# Patient Record
Sex: Male | Born: 1975
Health system: Southern US, Community
[De-identification: ages and names within clinical notes are randomized; demographics above are authoritative.]

## PROBLEM LIST (undated history)

## (undated) ENCOUNTER — Emergency Department: Discharge status: Absent without leave | Payer: Self-pay

## (undated) DIAGNOSIS — I774 Celiac artery compression syndrome: Secondary | ICD-10-CM

## (undated) HISTORY — PX: CHOLECYSTECTOMY: SHX55

---

## 2017-06-16 ENCOUNTER — Observation Stay
Admission: EM | Admit: 2017-06-16 | Discharge: 2017-06-17 | Disposition: A | Discharge status: Home / Self Care | Payer: BLUE CROSS/BLUE SHIELD | Attending: Family Medicine | Admitting: Family Medicine

## 2017-06-16 ENCOUNTER — Encounter: Payer: Self-pay | Admitting: Emergency Medicine

## 2017-06-16 ENCOUNTER — Other Ambulatory Visit: Payer: Self-pay

## 2017-06-16 DIAGNOSIS — Z9049 Acquired absence of other specified parts of digestive tract: Secondary | ICD-10-CM | POA: Diagnosis not present

## 2017-06-16 DIAGNOSIS — R079 Chest pain, unspecified: Secondary | ICD-10-CM | POA: Insufficient documentation

## 2017-06-16 DIAGNOSIS — K859 Acute pancreatitis without necrosis or infection, unspecified: Secondary | ICD-10-CM | POA: Diagnosis not present

## 2017-06-16 DIAGNOSIS — I774 Celiac artery compression syndrome: Secondary | ICD-10-CM | POA: Insufficient documentation

## 2017-06-16 DIAGNOSIS — R1013 Epigastric pain: Secondary | ICD-10-CM | POA: Diagnosis present

## 2017-06-16 DIAGNOSIS — Z79899 Other long term (current) drug therapy: Secondary | ICD-10-CM | POA: Insufficient documentation

## 2017-06-16 DIAGNOSIS — R112 Nausea with vomiting, unspecified: Secondary | ICD-10-CM | POA: Insufficient documentation

## 2017-06-16 DIAGNOSIS — Z87891 Personal history of nicotine dependence: Secondary | ICD-10-CM | POA: Insufficient documentation

## 2017-06-16 DIAGNOSIS — G894 Chronic pain syndrome: Secondary | ICD-10-CM | POA: Diagnosis not present

## 2017-06-16 HISTORY — DX: Celiac artery compression syndrome: I77.4

## 2017-06-16 NOTE — ED Triage Notes (Signed)
Pt diagnosed 2 days ago with median arcuate ligament syndrome; presents tonight with c/o chest pain and abd pain; pt leaning to the right with his right hand holding his left chest; groaning at stat registration;

## 2017-06-17 ENCOUNTER — Encounter: Payer: Self-pay | Admitting: Emergency Medicine

## 2017-06-17 ENCOUNTER — Other Ambulatory Visit: Payer: Self-pay

## 2017-06-17 ENCOUNTER — Emergency Department: Payer: BLUE CROSS/BLUE SHIELD

## 2017-06-17 DIAGNOSIS — K859 Acute pancreatitis without necrosis or infection, unspecified: Secondary | ICD-10-CM | POA: Diagnosis present

## 2017-06-17 LAB — COMPREHENSIVE METABOLIC PANEL
ALBUMIN: 4.2 g/dL (ref 3.5–5.0)
ALT: 25 U/L (ref 17–63)
AST: 35 U/L (ref 15–41)
Alkaline Phosphatase: 81 U/L (ref 38–126)
Anion gap: 11 (ref 5–15)
BILIRUBIN TOTAL: 0.6 mg/dL (ref 0.3–1.2)
BUN: 9 mg/dL (ref 6–20)
CHLORIDE: 105 mmol/L (ref 101–111)
CO2: 22 mmol/L (ref 22–32)
Calcium: 9.1 mg/dL (ref 8.9–10.3)
Creatinine, Ser: 1.14 mg/dL (ref 0.61–1.24)
GFR calc Af Amer: >60 mL/min (ref >60)
GFR calc non Af Amer: >60 mL/min (ref >60)
GLUCOSE: 176 mg/dL — AB (ref 65–99)
POTASSIUM: 3.4 mmol/L — AB (ref 3.5–5.1)
Sodium: 138 mmol/L (ref 135–145)
Total Protein: 7.9 g/dL (ref 6.5–8.1)

## 2017-06-17 LAB — URINALYSIS, COMPLETE (UACMP) WITH MICROSCOPIC
BACTERIA UA: NONE SEEN
Bilirubin Urine: NEGATIVE
Glucose, UA: 150 mg/dL — AB
Ketones, ur: 5 mg/dL — AB
Leukocytes, UA: NEGATIVE
Nitrite: NEGATIVE
PH: 6 (ref 5.0–8.0)
Protein, ur: 30 mg/dL — AB
SPECIFIC GRAVITY, URINE: 1.017 (ref 1.005–1.030)
SQUAMOUS EPITHELIAL / LPF: NONE SEEN

## 2017-06-17 LAB — LIPASE, BLOOD: Lipase: 62 U/L — ABNORMAL HIGH (ref 11–51)

## 2017-06-17 LAB — CBC
HEMATOCRIT: 40.4 % (ref 40.0–52.0)
Hemoglobin: 14 g/dL (ref 13.0–18.0)
MCH: 32.1 pg (ref 26.0–34.0)
MCHC: 34.7 g/dL (ref 32.0–36.0)
MCV: 92.4 fL (ref 80.0–100.0)
Platelets: 243 10*3/uL (ref 150–440)
RBC: 4.37 MIL/uL — ABNORMAL LOW (ref 4.40–5.90)
RDW: 13.8 % (ref 11.5–14.5)
WBC: 6.5 10*3/uL (ref 3.8–10.6)

## 2017-06-17 LAB — SEDIMENTATION RATE: SED RATE: 38 mm/h — AB (ref 0–15)

## 2017-06-17 LAB — LACTIC ACID, PLASMA
Lactic Acid, Venous: 1.3 mmol/L (ref 0.5–1.9)
Lactic Acid, Venous: 1.5 mmol/L (ref 0.5–1.9)

## 2017-06-17 LAB — TROPONIN I: Troponin I: <0.03 ng/mL (ref <0.03)

## 2017-06-17 MED ORDER — ONDANSETRON HCL 4 MG/2ML IJ SOLN
4.0000 mg | Freq: Four times a day (QID) | INTRAMUSCULAR | Status: DC | PRN
Start: 2017-06-17 — End: 2017-06-17
  Administered 2017-06-17: 4 mg via INTRAVENOUS
  Filled 2017-06-17: qty 2

## 2017-06-17 MED ORDER — DILTIAZEM HCL ER COATED BEADS 120 MG PO CP24
120.0000 mg | ORAL_CAPSULE | Freq: Every day | ORAL | Status: DC
  Administered 2017-06-17: 120 mg via ORAL
  Filled 2017-06-17: qty 1

## 2017-06-17 MED ORDER — ACETAMINOPHEN 650 MG RE SUPP: 650.0000 mg | Freq: Four times a day (QID) | RECTAL | Status: DC | PRN

## 2017-06-17 MED ORDER — OXYCODONE-ACETAMINOPHEN 5-325 MG PO TABS: 1.0000 | ORAL_TABLET | Freq: Three times a day (TID) | ORAL | 0 refills | Status: DC | PRN

## 2017-06-17 MED ORDER — ENOXAPARIN SODIUM 40 MG/0.4ML ~~LOC~~ SOLN
40.0000 mg | SUBCUTANEOUS | Status: DC
  Filled 2017-06-17: qty 0.4

## 2017-06-17 MED ORDER — HALOPERIDOL LACTATE 5 MG/ML IJ SOLN
INTRAMUSCULAR | Status: AC
  Filled 2017-06-17: qty 1

## 2017-06-17 MED ORDER — LABETALOL HCL 5 MG/ML IV SOLN
10.0000 mg | INTRAVENOUS | Status: DC | PRN
  Administered 2017-06-17: 10 mg via INTRAVENOUS
  Filled 2017-06-17: qty 4

## 2017-06-17 MED ORDER — MORPHINE SULFATE (PF) 4 MG/ML IV SOLN
4.0000 mg | Freq: Once | INTRAVENOUS | Status: AC
  Administered 2017-06-17: 4 mg via INTRAVENOUS
  Filled 2017-06-17: qty 1

## 2017-06-17 MED ORDER — HALOPERIDOL LACTATE 5 MG/ML IJ SOLN
2.5000 mg | Freq: Once | INTRAMUSCULAR | Status: DC
  Filled 2017-06-17: qty 1

## 2017-06-17 MED ORDER — METOCLOPRAMIDE HCL 5 MG/ML IJ SOLN
10.0000 mg | Freq: Three times a day (TID) | INTRAMUSCULAR | Status: DC
  Administered 2017-06-17: 10 mg via INTRAVENOUS
  Filled 2017-06-17: qty 2

## 2017-06-17 MED ORDER — METOPROLOL TARTRATE 100 MG PO TABS: 100.0000 mg | ORAL_TABLET | Freq: Two times a day (BID) | ORAL | 0 refills | Status: DC

## 2017-06-17 MED ORDER — SODIUM CHLORIDE 0.9 % IV SOLN
INTRAVENOUS | Status: DC
  Administered 2017-06-17: 06:00:00 via INTRAVENOUS

## 2017-06-17 MED ORDER — ALUM & MAG HYDROXIDE-SIMETH 200-200-20 MG/5ML PO SUSP
30.0000 mL | ORAL | Status: DC | PRN
  Administered 2017-06-17: 30 mL via ORAL
  Filled 2017-06-17: qty 30

## 2017-06-17 MED ORDER — METOPROLOL TARTRATE 50 MG PO TABS
50.0000 mg | ORAL_TABLET | Freq: Two times a day (BID) | ORAL | Status: DC
  Administered 2017-06-17: 50 mg via ORAL
  Filled 2017-06-17: qty 1

## 2017-06-17 MED ORDER — HYDRALAZINE HCL 20 MG/ML IJ SOLN: 10.0000 mg | INTRAMUSCULAR | Status: DC | PRN

## 2017-06-17 MED ORDER — PANTOPRAZOLE SODIUM 40 MG PO TBEC: 40.0000 mg | DELAYED_RELEASE_TABLET | Freq: Every day | ORAL | 0 refills | Status: DC

## 2017-06-17 MED ORDER — METOCLOPRAMIDE HCL 5 MG PO TABS: 5.0000 mg | ORAL_TABLET | Freq: Three times a day (TID) | ORAL | 0 refills | Status: DC

## 2017-06-17 MED ORDER — HYDROMORPHONE HCL 1 MG/ML IJ SOLN
INTRAMUSCULAR | Status: AC
  Filled 2017-06-17: qty 1

## 2017-06-17 MED ORDER — ONDANSETRON HCL 4 MG/2ML IJ SOLN
4.0000 mg | Freq: Once | INTRAMUSCULAR | Status: AC | PRN
  Administered 2017-06-17: 4 mg via INTRAVENOUS
  Filled 2017-06-17: qty 2

## 2017-06-17 MED ORDER — MORPHINE SULFATE (PF) 2 MG/ML IV SOLN
2.0000 mg | Freq: Once | INTRAVENOUS | Status: AC
  Administered 2017-06-17: 2 mg via INTRAVENOUS
  Filled 2017-06-17: qty 1

## 2017-06-17 MED ORDER — SODIUM CHLORIDE 0.9 % IV BOLUS (SEPSIS)
1000.0000 mL | Freq: Once | INTRAVENOUS | Status: AC
  Administered 2017-06-17: 1000 mL via INTRAVENOUS

## 2017-06-17 MED ORDER — HYDROMORPHONE HCL 1 MG/ML IJ SOLN
1.0000 mg | Freq: Once | INTRAMUSCULAR | Status: AC
  Administered 2017-06-17: 1 mg via INTRAVENOUS

## 2017-06-17 MED ORDER — HYDROMORPHONE HCL 1 MG/ML IJ SOLN
1.0000 mg | INTRAMUSCULAR | Status: DC | PRN
  Administered 2017-06-17 (×2): 1 mg via INTRAVENOUS
  Filled 2017-06-17 (×2): qty 1

## 2017-06-17 MED ORDER — NITROGLYCERIN 0.4 MG SL SUBL
0.4000 mg | SUBLINGUAL_TABLET | SUBLINGUAL | Status: DC | PRN
  Administered 2017-06-17: 0.4 mg via SUBLINGUAL
  Filled 2017-06-17: qty 1

## 2017-06-17 MED ORDER — ONDANSETRON HCL 4 MG PO TABS: 4.0000 mg | ORAL_TABLET | Freq: Four times a day (QID) | ORAL | Status: DC | PRN

## 2017-06-17 MED ORDER — ACETAMINOPHEN 325 MG PO TABS: 650.0000 mg | ORAL_TABLET | Freq: Four times a day (QID) | ORAL | Status: DC | PRN

## 2017-06-17 MED ORDER — METOPROLOL TARTRATE 50 MG PO TABS: 100.0000 mg | ORAL_TABLET | Freq: Two times a day (BID) | ORAL | Status: DC

## 2017-06-17 MED ORDER — DILTIAZEM HCL ER COATED BEADS 120 MG PO CP24: 120.0000 mg | ORAL_CAPSULE | Freq: Every day | ORAL | 0 refills | Status: DC

## 2017-06-17 MED ORDER — ONDANSETRON HCL 4 MG/2ML IJ SOLN
4.0000 mg | Freq: Once | INTRAMUSCULAR | Status: AC
Start: 2017-06-17 — End: 2017-06-17
  Administered 2017-06-17: 4 mg via INTRAVENOUS
  Filled 2017-06-17: qty 2

## 2017-06-17 MED ORDER — GI COCKTAIL ~~LOC~~
30.0000 mL | Freq: Once | ORAL | Status: DC
  Filled 2017-06-17: qty 30

## 2017-06-17 MED ORDER — IOPAMIDOL (ISOVUE-370) INJECTION 76%
100.0000 mL | Freq: Once | INTRAVENOUS | Status: AC | PRN
  Administered 2017-06-17: 100 mL via INTRAVENOUS

## 2017-06-17 MED ORDER — SODIUM CHLORIDE 0.9 % IV BOLUS (SEPSIS): 500.0000 mL | Freq: Once | INTRAVENOUS | Status: DC

## 2017-06-17 MED ORDER — OXYCODONE-ACETAMINOPHEN 5-325 MG PO TABS
2.0000 | ORAL_TABLET | Freq: Once | ORAL | Status: DC
  Filled 2017-06-17: qty 2

## 2017-06-17 MED ORDER — KETOROLAC TROMETHAMINE 30 MG/ML IJ SOLN
30.0000 mg | Freq: Once | INTRAMUSCULAR | Status: AC
  Administered 2017-06-17: 30 mg via INTRAVENOUS
  Filled 2017-06-17: qty 1

## 2017-06-17 NOTE — ED Notes (Signed)
Patient asked to sign AMA form. Patient's spouse requesting additional medications and treatment options for patient. Patient and spouse asked if they had reconsidered staying for additional treatment. Patient's spouse requesting MD come speak with patient/spouse. MD informed.

## 2017-06-17 NOTE — Consult Note (Signed)
Kirk Miniumarren Mirriam Vadala, MD [facility]  [address] [place], [place] [address] Phone: [phone] Fax : [phone]  Consultation  Referring Provider:     Dr. Katheren ShamsSalary Primary Care Physician:  Patient, No Pcp Per Primary Gastroenterologist:   [facility]        Reason for Consultation:     Abdominal/chest pain  Date of Admission:  06/16/2017 Date of Consultation:  06/17/2017         HPI:   Kirk BaltimoreDamien Barker is a 42 y.o. male who comes in to the hospital with epigastric/chest pain. The patient has had this pain for 4 years with only a short period of time when he was living in [place] where the pain was not bothering him. The patient states he has [facility], [facility], [facility] in [place] and has been [facility] to [facility]. The patient states that his pain is so severe in the morning that he is sometimes unable to go to work. He then states that lasts for about 2 hours and then eases up and then returns later in the day. There is no report of any food that makes it better or worse. He also does not have any change in bowel habits. The patient states that he was told that he may have ligaments at his diaphragm pulling on his esophagus that may be causing him to have his problems. He states that he has been tried on antispasmodic medications in the past such as Bentyl without any relief. He also reports that none of the previous gastroenterologists have come up with a diagnosis as to the cause of his chronic pain. He says he is usually sent to the pain clinic after nothing is found.  Past Medical History:  Diagnosis Date  . Median arcuate ligament syndrome [facility](HCC)     Past Surgical History:  Procedure Laterality Date  . CHOLECYSTECTOMY      Prior to Admission medications   Medication Sig Start Date End Date Taking? Authorizing Provider  metoprolol tartrate (LOPRESSOR) 100 MG tablet Take 1 tablet (100 mg total) by mouth 2 (two) times daily. 06/17/17   Salary, Evelena AsaMontell  D, MD  oxyCODONE-acetaminophen (PERCOCET/ROXICET) 5-325 MG tablet Take 1-2 tablets by mouth every 8 (eight) hours as needed for severe pain. 06/17/17   Salary, [name], MD  pantoprazole (PROTONIX) 40 MG tablet Take 1 tablet (40 mg total) by mouth daily. 06/17/17 06/17/18  Salary, [name], MD    History reviewed. No pertinent family history.   Social History   Tobacco Use  . Smoking status: Former Games developermoker  . Smokeless tobacco: Never Used  Substance Use Topics  . Alcohol use: No    Frequency: Never  . Drug use: No    Allergies as of 06/16/2017  . (No Known Allergies)    Review of Systems:    All systems reviewed and negative except where noted in HPI.   Physical Exam:  Vital signs in last 24 hours: Temp:  [98.1 F (36.7 C)-98.6 F (37 C)] 98.6 F (37 C) (01/20 0804) Pulse Rate:  [74-108] 79 (01/20 0804) Resp:  [10-48] 18 (01/20 0804) BP: (145-188)/(79-109) 173/96 (01/20 0804) SpO2:  [92 %-100 %] 100 % (01/20 0804) Weight:  [220 lb (99.8 kg)-222 lb 12.8 oz (101.1 kg)] 222 lb 12.8 oz (101.1 kg) (01/20 13080623) Last BM Date: 06/16/17 General:   Pleasant, cooperative in NAD Head:  Normocephalic and atraumatic. Eyes:   No icterus.   Conjunctiva pink.  PERRLA. Ears:  Normal auditory acuity. Neck:  Supple; no masses or thyroidomegaly Lungs: Respirations even and unlabored. Lungs clear to auscultation bilaterally.   No wheezes, crackles, or rhonchi.  Heart:  Regular rate and rhythm;  Without murmur, clicks, rubs or gallops Abdomen:  Soft, nondistended, nontender. Normal bowel sounds. No appreciable masses or hepatomegaly.  No rebound or guarding.  Rectal:  Not performed. Msk:  Symmetrical without gross deformities.    Extremities:  Without edema, cyanosis or clubbing. Neurologic:  Alert and oriented x3;  grossly normal neurologically. Skin:  Intact without significant lesions or rashes. Cervical Nodes:  No significant cervical adenopathy. Psych:  Alert and cooperative. Normal  affect.  LAB RESULTS: Recent Labs    06/16/17 2359  WBC 6.5  HGB 14.0  HCT 40.4  PLT 243   BMET Recent Labs    06/16/17 2359  NA 138  K 3.4*  CL 105  CO2 22  GLUCOSE 176*  BUN 9  CREATININE 1.14  CALCIUM 9.1   LFT Recent Labs    06/16/17 2359  PROT 7.9  ALBUMIN 4.2  AST 35  ALT 25  ALKPHOS 81  BILITOT 0.6   PT/INR No results for input(s): LABPROT, INR in the last 72 hours.  STUDIES: Ct Angio Chest/abd/pel For Dissection W And/or Wo Contrast  Result Date: 06/17/2017 CLINICAL DATA:  Chest pain. EXAM: CT ANGIOGRAPHY CHEST, ABDOMEN AND PELVIS TECHNIQUE: Multidetector CT imaging through the chest, abdomen and pelvis was performed using the standard protocol during bolus administration of intravenous contrast. Multiplanar reconstructed images and MIPs were obtained and reviewed to evaluate the vascular anatomy. CONTRAST:  ISOVUE-370 IOPAMIDOL (ISOVUE-370) INJECTION 76% COMPARISON:  None FINDINGS: CTA CHEST FINDINGS Cardiovascular: Preferential opacification of the thoracic aorta. No evidence of thoracic aortic aneurysm or dissection. Normal heart size. No pericardial effusion. Mediastinum/Nodes: No enlarged mediastinal, hilar, or axillary lymph nodes. Thyroid gland, trachea, and esophagus demonstrate no significant findings. Lungs/Pleura: No pleural effusion. No airspace consolidation or atelectasis. Musculoskeletal: No chest wall abnormality. No acute or significant osseous findings. Review of the MIP images confirms the above findings. CTA ABDOMEN AND PELVIS FINDINGS VASCULAR Aorta: Normal caliber aorta without aneurysm, dissection, vasculitis or significant stenosis. Celiac: Patent without evidence of aneurysm, dissection, vasculitis or significant stenosis. SMA: Patent without evidence of aneurysm, dissection, vasculitis or significant stenosis. Renals: Both renal arteries are patent without evidence of aneurysm, dissection, vasculitis, fibromuscular dysplasia or  significant stenosis. IMA: Patent without evidence of aneurysm, dissection, vasculitis or significant stenosis. Inflow: Patent without evidence of aneurysm, dissection, vasculitis or significant stenosis. Veins: No obvious venous abnormality within the limitations of this arterial phase study. Review of the MIP images confirms the above findings. NON-VASCULAR Hepatobiliary: No focal liver abnormality is seen. Status post cholecystectomy. No biliary dilatation. Pancreas: Unremarkable. No pancreatic ductal dilatation or surrounding inflammatory changes. Spleen: Normal in size without focal abnormality. Adrenals/Urinary Tract: Adrenal glands are unremarkable. Kidneys are normal, without renal calculi, focal lesion, or hydronephrosis. Bladder is unremarkable. Stomach/Bowel: Stomach is within normal limits. Appendix appears normal. No evidence of bowel wall thickening, distention, or inflammatory changes. Lymphatic: No significant vascular findings are present. No enlarged abdominal or pelvic lymph nodes. Reproductive: Prostate is unremarkable. Other: No abdominal wall hernia or abnormality. No abdominopelvic ascites. Musculoskeletal: No acute or significant osseous findings. Review of the MIP images confirms the above findings. IMPRESSION: 1. No acute findings identified.  No evidence for aortic dissection. 2. Previous cholecystectomy. Electronically Signed   By: Veronda Prude.D.  On: 06/17/2017 01:27      Impression / Plan:   Kirk Barker is a 42 y.o. y/o male with chronic epigastric/chest pain that has had an extensive workup at multiple medical centers including [facility], [facility] in [place] and he has been referred over to [place]. The patient will be tried on a calcium channel blocker for possible esophageal spasms. The patient has been explained the plan and agrees with it.  Thank you for involving me in the care of this patient.      LOS: 0 days   Kirk Minium, MD  06/17/2017, 11:54  AM   Note: This dictation was prepared with Dragon dictation along with smaller phrase technology. Any transcriptional errors that result from this process are unintentional.

## 2017-06-17 NOTE — ED Notes (Signed)
Work note provided.

## 2017-06-17 NOTE — Progress Notes (Signed)
Patient discharged to home with wife.  Starting putting his clothes immediately after I told him the Dilaudid was discontinued. Patient and his wife told me that he doesn't go to the pain clinic because he doesn't want to deal with the side effects of narcotics.  He was very insistent that he doesn't want to take narcotic pain medication. He wants the underlying disease treated.   He also reports that when his pain is severe he goes to the emergency room, most recently in [place] about 2 weeks ago, and they give him frequent doses of IV Dilaudid until his pain is under control and then send him home.  He expressed frustration that we did not do this.  He has no prescription for oral pain medication to manage his pain at home. His wife reports that he will be [facility] for a work up for a laparoscopic procedure to treat his disease but doesn't know what the procedure is or what is will accomplish. I encouraged him to seek a PCP who can manage his illness and medications, and coordinate his consults, and work ups.  There is no consistent physician he has seen since this began 5 years ago. Neither the patient nor his wife were receptive of this suggestion. Patient started on Calcium Channel blocker per GI consult to help control his symptoms.  Also started on scheduled Metochlopramide for the same purpose.

## 2017-06-17 NOTE — H&P (Signed)
[facility] [place] [facility]   PATIENT NAME: Kirk Barker    MR#:  [id number]  DATE OF BIRTH:  07-Nov-1975  DATE OF ADMISSION:  06/16/2017  PRIMARY CARE PHYSICIAN: Patient, No Pcp Per   REQUESTING/REFERRING PHYSICIAN:   CHIEF COMPLAINT:   Chief Complaint  Patient presents with  . Abdominal Pain  . Chest Pain    HISTORY OF PRESENT ILLNESS: Kirk Barker  is a 42 y.o. male with a known history of median arcuate ligament syndrome presented to the emergency room with abdominal pain and epigastric discomfort which has been severe since Monday.  Patient does have a complaint for the last 4 months.  The abdominal pain is sharp in nature 6 out of 10 on a scale of 1-10.  Patient was evaluated in the emergency room his lipase level was mildly elevated.  He was given pain medication but still the abdominal pain persisted.  Patient also has some nausea and vomiting.  No history of any diarrhea.  No complaints of any rectal bleed.  Hospitalist service was consulted for further care.  Patient was worked up with CTA chest and abdomen which showed no dissection, acute pathology.  PAST MEDICAL HISTORY:   Past Medical History:  Diagnosis Date  . Median arcuate ligament syndrome (HCC)     PAST SURGICAL HISTORY:  Past Surgical History:  Procedure Laterality Date  . CHOLECYSTECTOMY      SOCIAL HISTORY:  Social History   Tobacco Use  . Smoking status: Former Games developer  . Smokeless tobacco: Never Used  Substance Use Topics  . Alcohol use: No    Frequency: Never    FAMILY HISTORY: No family history on file.  DRUG ALLERGIES: No Known Allergies  REVIEW OF SYSTEMS:   CONSTITUTIONAL: No fever, fatigue or weakness.  EYES: No blurred or double vision.  EARS, NOSE, AND THROAT: No tinnitus or ear pain.  RESPIRATORY: No cough, shortness of breath, wheezing or hemoptysis.  CARDIOVASCULAR: No chest pain, orthopnea, edema.  GASTROINTESTINAL: Has nausea, vomiting,   abdominal pain.  No diarrhea. GENITOURINARY: No dysuria, hematuria.  ENDOCRINE: No polyuria, nocturia,  HEMATOLOGY: No anemia, easy bruising or bleeding SKIN: No rash or lesion. MUSCULOSKELETAL: No joint pain or arthritis.   NEUROLOGIC: No tingling, numbness, weakness.  PSYCHIATRY: No anxiety or depression.   MEDICATIONS AT HOME:  Prior to Admission medications   Not on File      PHYSICAL EXAMINATION:   VITAL SIGNS: Blood pressure (!) 159/101, pulse 91, temperature 98.2 F (36.8 C), temperature source Oral, resp. rate 20, height 6' (1.829 m), weight 99.8 kg (220 lb), SpO2 98 %.  GENERAL:  42 y.o.-year-old patient lying in the bed with no acute distress.  EYES: Pupils equal, round, reactive to light and accommodation. No scleral icterus. Extraocular muscles intact.  HEENT: Head atraumatic, normocephalic. Oropharynx and nasopharynx clear.  NECK:  Supple, no jugular venous distention. No thyroid enlargement, no tenderness.  LUNGS: Normal breath sounds bilaterally, no wheezing, rales,rhonchi or crepitation. No use of accessory muscles of respiration.  CARDIOVASCULAR: S1, S2 normal. No murmurs, rubs, or gallops.  ABDOMEN: Soft, tenderness epigastrium and right upper quadrant, nondistended. Bowel sounds present. No organomegaly or mass.  EXTREMITIES: No pedal edema, cyanosis, or clubbing.  NEUROLOGIC: Cranial nerves II through XII are intact. Muscle strength 5/5 in all extremities. Sensation intact. Gait not checked.  PSYCHIATRIC: The patient is alert and oriented x 3.  SKIN: No obvious rash, lesion, or ulcer.   LABORATORY PANEL:  CBC Recent Labs  Lab 06/16/17 2359  WBC 6.5  HGB 14.0  HCT 40.4  PLT 243  MCV 92.4  MCH 32.1  MCHC 34.7  RDW 13.8   ------------------------------------------------------------------------------------------------------------------  Chemistries  Recent Labs  Lab 06/16/17 2359  NA 138  K 3.4*  CL 105  CO2 22  GLUCOSE 176*  BUN 9   CREATININE 1.14  CALCIUM 9.1  AST 35  ALT 25  ALKPHOS 81  BILITOT 0.6   ------------------------------------------------------------------------------------------------------------------ estimated creatinine clearance is 102.2 mL/min (by C-G formula based on SCr of 1.14 mg/dL). ------------------------------------------------------------------------------------------------------------------ No results for input(s): TSH, T4TOTAL, T3FREE, THYROIDAB in the last 72 hours.  Invalid input(s): FREET3   Coagulation profile No results for input(s): INR, PROTIME in the last 168 hours. ------------------------------------------------------------------------------------------------------------------- No results for input(s): DDIMER in the last 72 hours. -------------------------------------------------------------------------------------------------------------------  Cardiac Enzymes Recent Labs  Lab 06/16/17 2359 06/17/17 0336  TROPONINI <0.03 <0.03   ------------------------------------------------------------------------------------------------------------------ Invalid input(s): POCBNP  ---------------------------------------------------------------------------------------------------------------  Urinalysis No results found for: COLORURINE, APPEARANCEUR, LABSPEC, PHURINE, GLUCOSEU, HGBUR, BILIRUBINUR, KETONESUR, PROTEINUR, UROBILINOGEN, NITRITE, LEUKOCYTESUR   RADIOLOGY: Ct Angio Chest/abd/pel For Dissection W And/or Wo Contrast  Result Date: 06/17/2017 CLINICAL DATA:  Chest pain. EXAM: CT ANGIOGRAPHY CHEST, ABDOMEN AND PELVIS TECHNIQUE: Multidetector CT imaging through the chest, abdomen and pelvis was performed using the standard protocol during bolus administration of intravenous contrast. Multiplanar reconstructed images and MIPs were obtained and reviewed to evaluate the vascular anatomy. CONTRAST:  100mL ISOVUE-370 IOPAMIDOL (ISOVUE-370) INJECTION 76% COMPARISON:  None  FINDINGS: CTA CHEST FINDINGS Cardiovascular: Preferential opacification of the thoracic aorta. No evidence of thoracic aortic aneurysm or dissection. Normal heart size. No pericardial effusion. Mediastinum/Nodes: No enlarged mediastinal, hilar, or axillary lymph nodes. Thyroid gland, trachea, and esophagus demonstrate no significant findings. Lungs/Pleura: No pleural effusion. No airspace consolidation or atelectasis. Musculoskeletal: No chest wall abnormality. No acute or significant osseous findings. Review of the MIP images confirms the above findings. CTA ABDOMEN AND PELVIS FINDINGS VASCULAR Aorta: Normal caliber aorta without aneurysm, dissection, vasculitis or significant stenosis. Celiac: Patent without evidence of aneurysm, dissection, vasculitis or significant stenosis. SMA: Patent without evidence of aneurysm, dissection, vasculitis or significant stenosis. Renals: Both renal arteries are patent without evidence of aneurysm, dissection, vasculitis, fibromuscular dysplasia or significant stenosis. IMA: Patent without evidence of aneurysm, dissection, vasculitis or significant stenosis. Inflow: Patent without evidence of aneurysm, dissection, vasculitis or significant stenosis. Veins: No obvious venous abnormality within the limitations of this arterial phase study. Review of the MIP images confirms the above findings. NON-VASCULAR Hepatobiliary: No focal liver abnormality is seen. Status post cholecystectomy. No biliary dilatation. Pancreas: Unremarkable. No pancreatic ductal dilatation or surrounding inflammatory changes. Spleen: Normal in size without focal abnormality. Adrenals/Urinary Tract: Adrenal glands are unremarkable. Kidneys are normal, without renal calculi, focal lesion, or hydronephrosis. Bladder is unremarkable. Stomach/Bowel: Stomach is within normal limits. Appendix appears normal. No evidence of bowel wall thickening, distention, or inflammatory changes. Lymphatic: No significant vascular  findings are present. No enlarged abdominal or pelvic lymph nodes. Reproductive: Prostate is unremarkable. Other: No abdominal wall hernia or abnormality. No abdominopelvic ascites. Musculoskeletal: No acute or significant osseous findings. Review of the MIP images confirms the above findings. IMPRESSION: 1. No acute findings identified.  No evidence for aortic dissection. 2. Previous cholecystectomy. Electronically Signed   By: Signa Kellaylor  Stroud M.D.   On: 06/17/2017 01:27    EKG: Orders placed or performed during the hospital encounter of 06/16/17  . ED EKG  . ED EKG  . EKG  12-Lead  . EKG 12-Lead    IMPRESSION AND PLAN: 42 year old male patient with history of median arcuate ligament syndrome presented to the emergency room with abdominal pain and vomiting.  Admitting diagnosis 1.  Acute pancreatitis 2.  Abdominal pain 3 nausea and vomiting. Treatment plan Admit patient to medical floor IV fluids Pain management with IV Dilaudid Clear liquid diet Follow-up lipase level Antiemetics  All the records are reviewed and case discussed with ED provider. Management plans discussed with the patient, family and they are in agreement.  CODE STATUS: Full code Code Status History    This patient does not have a recorded code status. Please follow your organizational policy for patients in this situation.       TOTAL TIME TAKING CARE OF THIS PATIENT: 51 minutes.    Ihor Austin M.D on 06/17/2017 at 5:47 AM  Between 7am to 6pm - Pager - [phone]  After 6pm go to [email or web address] - [email or web address]  [facility]  Office  [phone]  CC: Primary care physician; Patient, No Pcp Per

## 2017-06-17 NOTE — Progress Notes (Signed)
  June 17, 2017  Patient: Kirk Barker  Date of Birth: 04/09/1976  Date of Visit: 06/16/2017    To Whom It May Concern:  Kirk Barker was seen and treated in our emergency department or urgent care center on 06/16/2017. Kirk Barker  May return to work tomorrow 06/18/17.  Sincerely,   Audria NineAmanda Kiyomi Pallo RN Dr Jetty DuhamelMontell Salary

## 2017-06-17 NOTE — Plan of Care (Signed)
Patient and wife are opposed to taking long term narcotics, but do expect to receive multiple doses of IV Diluadid in the emergency room.    Expectations are a hindrance to his care management

## 2017-06-17 NOTE — ED Notes (Signed)
Patient requesting pain medication. This RN discussed that oral pain medication (Percocet) had been offered and refused by previous Charity fundraiserN. Patient reported that all oral pain medications do not work for his pain, and instead make his abdominal pain worse. Patient requesting to be given IV pain medication only.  This RN explained that there was no IV pain medication ordered at this time and that the issue would be further discussed with the MD. MD informed.

## 2017-06-17 NOTE — ED Notes (Signed)
ED Provider at bedside. 

## 2017-06-17 NOTE — Discharge Summary (Addendum)
[facility] - [facility]   PATIENT NAME: Kirk Barker    MR#:  [id number]  DATE OF BIRTH:  07/02/1975  DATE OF ADMISSION:  06/16/2017 ADMITTING PHYSICIAN: Ihor Austin, MD  DATE OF DISCHARGE: No discharge date for patient encounter.  PRIMARY CARE PHYSICIAN: Patient, No Pcp Per    ADMISSION DIAGNOSIS:  Epigastric pain [R10.13]  Acute on chronic pain syndrome  DISCHARGE DIAGNOSIS:  Active Problems:   Pancreatitis Acute on chronic pain syndrome  SECONDARY DIAGNOSIS:   Past Medical History:  Diagnosis Date  . Median arcuate ligament syndrome [facility])     HOSPITAL COURSE:  1 acute on chronic pain syndrome Patient has had chronic abdominal pain for approximately 4 years, has had extensive workup by several gastroenterologist in the remote past, symptomatology was thought to be related to ? median arcuate ligament syndrome, referred to pain specialist in the past-has not seen in over one year, recently moved to this area within the last several days, has no local doctors, has appointment to follow-up with [facility] health system physician for further medical management of this malady-patient was encouraged to follow-up status post discharge as inpatient workup is largely unimpressive, lactic acid level normal, CT abdomen was negative, gastroenterology to see patient while in house-recommended calcium channel blocker for possible esophageal spasms, could not rule out possible opioid drug-seeking behavior, would reframe from hospital admissions for this malady, wound also reframe from IV opioid medication as we may be causing adverse behavior developement in patient.  2 acute suspected pancreatitis Ruled out CT abdomen was a normal study, lipase normal  3 acute nausea/vomiting Resolved Treated with antiemetics PRN  DISCHARGE CONDITIONS:  On day of discharge patient is afebrile, exam is stable, tolerating diet, ready for discharge home with  appropriate follow-up to establish care with local primary care provider in 2-3 days for reevaluation, patient follow-up with pain specialist in 1 week for reevaluation of acute on chronic abdominal pain, patient encouraged to follow-up with [facility] hospital system for median arcuate ligament syndrome, for more specific details please see chart  CONSULTS OBTAINED:  Treatment Team:  Midge Minium, MD  DRUG ALLERGIES:  No Known Allergies  DISCHARGE MEDICATIONS:   Allergies as of 06/17/2017   No Known Allergies     Medication List    TAKE these medications   diltiazem 120 MG 24 hr capsule Commonly known as:  CARDIZEM CD Take 1 capsule (120 mg total) by mouth daily.   metoCLOPramide 5 MG tablet Commonly known as:  REGLAN Take 1 tablet (5 mg total) by mouth 4 (four) times daily -  before meals and at bedtime.   metoprolol tartrate 100 MG tablet Commonly known as:  LOPRESSOR Take 1 tablet (100 mg total) by mouth 2 (two) times daily.   oxyCODONE-acetaminophen 5-325 MG tablet Commonly known as:  PERCOCET/ROXICET Take 1-2 tablets by mouth every 8 (eight) hours as needed for severe pain.   pantoprazole 40 MG tablet Commonly known as:  PROTONIX Take 1 tablet (40 mg total) by mouth daily.        DISCHARGE INSTRUCTIONS:   If you experience worsening of your admission symptoms, develop shortness of breath, life threatening emergency, suicidal or homicidal thoughts you must seek medical attention immediately by calling 911 or calling your MD immediately  if symptoms less severe.  You Must read complete instructions/literature along with all the possible adverse reactions/side effects for all the Medicines you take and that have been prescribed to you.  Take any new Medicines after you have completely understood and accept all the possible adverse reactions/side effects.   Please note  You were cared for by a hospitalist during your hospital stay. If you have any questions about  your discharge medications or the care you received while you were in the hospital after you are discharged, you can call the unit and asked to speak with the hospitalist on call if the hospitalist that took care of you is not available. Once you are discharged, your primary care physician will handle any further medical issues. Please note that NO REFILLS for any discharge medications will be authorized once you are discharged, as it is imperative that you return to your primary care physician (or establish a relationship with a primary care physician if you do not have one) for your aftercare needs so that they can reassess your need for medications and monitor your lab values.    Today   CHIEF COMPLAINT:   Chief Complaint  Patient presents with  . Abdominal Pain  . Chest Pain    HISTORY OF PRESENT ILLNESS:  42 y.o. male with a known history of median arcuate ligament syndrome presented to the emergency room with abdominal pain and epigastric discomfort which has been severe since Monday.  Patient does have a complaint for the last 4 months.  The abdominal pain is sharp in nature 6 out of 10 on a scale of 1-10.  Patient was evaluated in the emergency room his lipase level was mildly elevated.  He was given pain medication but still the abdominal pain persisted.  Patient also has some nausea and vomiting.  No history of any diarrhea.  No complaints of any rectal bleed.  Hospitalist service was consulted for further care.  Patient was worked up with CTA chest and abdomen which showed no dissection, acute pathology.     VITAL SIGNS:  Blood pressure (!) 163/107, pulse 77, temperature 98.6 F (37 C), temperature source Oral, resp. rate 18, height 6' (1.829 m), weight 101.1 kg (222 lb 12.8 oz), SpO2 100 %.  I/O:    Intake/Output Summary (Last 24 hours) at 06/17/2017 1302 Last data filed at 06/17/2017 1200 Gross per 24 hour  Intake -  Output 900 ml  Net -900 ml    PHYSICAL EXAMINATION:   GENERAL:  42 y.o.-year-old patient lying in the bed with no acute distress.  EYES: Pupils equal, round, reactive to light and accommodation. No scleral icterus. Extraocular muscles intact.  HEENT: Head atraumatic, normocephalic. Oropharynx and nasopharynx clear.  NECK:  Supple, no jugular venous distention. No thyroid enlargement, no tenderness.  LUNGS: Normal breath sounds bilaterally, no wheezing, rales,rhonchi or crepitation. No use of accessory muscles of respiration.  CARDIOVASCULAR: S1, S2 normal. No murmurs, rubs, or gallops.  ABDOMEN: Soft, non-tender, non-distended. Bowel sounds present. No organomegaly or mass.  EXTREMITIES: No pedal edema, cyanosis, or clubbing.  NEUROLOGIC: Cranial nerves II through XII are intact. Muscle strength 5/5 in all extremities. Sensation intact. Gait not checked.  PSYCHIATRIC: The patient is alert and oriented x 3.  SKIN: No obvious rash, lesion, or ulcer.   DATA REVIEW:   CBC Recent Labs  Lab 06/16/17 2359  WBC 6.5  HGB 14.0  HCT 40.4  PLT 243    Chemistries  Recent Labs  Lab 06/16/17 2359  NA 138  K 3.4*  CL 105  CO2 22  GLUCOSE 176*  BUN 9  CREATININE 1.14  CALCIUM 9.1  AST 35  ALT 25  ALKPHOS 81  BILITOT 0.6    Cardiac Enzymes Recent Labs  Lab 06/17/17 0336  TROPONINI <0.03    Microbiology Results  No results found for this or any previous visit.  RADIOLOGY:  Ct Angio Chest/abd/pel For Dissection W And/or Wo Contrast  Result Date: 06/17/2017 CLINICAL DATA:  Chest pain. EXAM: CT ANGIOGRAPHY CHEST, ABDOMEN AND PELVIS TECHNIQUE: Multidetector CT imaging through the chest, abdomen and pelvis was performed using the standard protocol during bolus administration of intravenous contrast. Multiplanar reconstructed images and MIPs were obtained and reviewed to evaluate the vascular anatomy. CONTRAST:  ISOVUE-370 IOPAMIDOL (ISOVUE-370) INJECTION 76% COMPARISON:  None FINDINGS: CTA CHEST FINDINGS Cardiovascular:  Preferential opacification of the thoracic aorta. No evidence of thoracic aortic aneurysm or dissection. Normal heart size. No pericardial effusion. Mediastinum/Nodes: No enlarged mediastinal, hilar, or axillary lymph nodes. Thyroid gland, trachea, and esophagus demonstrate no significant findings. Lungs/Pleura: No pleural effusion. No airspace consolidation or atelectasis. Musculoskeletal: No chest wall abnormality. No acute or significant osseous findings. Review of the MIP images confirms the above findings. CTA ABDOMEN AND PELVIS FINDINGS VASCULAR Aorta: Normal caliber aorta without aneurysm, dissection, vasculitis or significant stenosis. Celiac: Patent without evidence of aneurysm, dissection, vasculitis or significant stenosis. SMA: Patent without evidence of aneurysm, dissection, vasculitis or significant stenosis. Renals: Both renal arteries are patent without evidence of aneurysm, dissection, vasculitis, fibromuscular dysplasia or significant stenosis. IMA: Patent without evidence of aneurysm, dissection, vasculitis or significant stenosis. Inflow: Patent without evidence of aneurysm, dissection, vasculitis or significant stenosis. Veins: No obvious venous abnormality within the limitations of this arterial phase study. Review of the MIP images confirms the above findings. NON-VASCULAR Hepatobiliary: No focal liver abnormality is seen. Status post cholecystectomy. No biliary dilatation. Pancreas: Unremarkable. No pancreatic ductal dilatation or surrounding inflammatory changes. Spleen: Normal in size without focal abnormality. Adrenals/Urinary Tract: Adrenal glands are unremarkable. Kidneys are normal, without renal calculi, focal lesion, or hydronephrosis. Bladder is unremarkable. Stomach/Bowel: Stomach is within normal limits. Appendix appears normal. No evidence of bowel wall thickening, distention, or inflammatory changes. Lymphatic: No significant vascular findings are present. No enlarged abdominal  or pelvic lymph nodes. Reproductive: Prostate is unremarkable. Other: No abdominal wall hernia or abnormality. No abdominopelvic ascites. Musculoskeletal: No acute or significant osseous findings. Review of the MIP images confirms the above findings. IMPRESSION: 1. No acute findings identified.  No evidence for aortic dissection. 2. Previous cholecystectomy. Electronically Signed   By: Signa Kell M.D.   On: 06/17/2017 01:27    EKG:   Orders placed or performed during the hospital encounter of 06/16/17  . ED EKG  . ED EKG  . EKG 12-Lead  . EKG 12-Lead      Management plans discussed with the patient, family and they are in agreement.  CODE STATUS:     Code Status Orders  (From admission, onward)        Start     Ordered   06/17/17 0617  Full code  Continuous     06/17/17 0616    Code Status History    Date Active Date Inactive Code Status Order ID Comments User Context   This patient has a current code status but no historical code status.      TOTAL TIME TAKING CARE OF THIS PATIENT: 45 minutes.    Evelena Asa Lashara Urey M.D on 06/17/2017 at 1:02 PM  Between 7am to 6pm - Pager - [phone]  After 6pm go to [email or web address] - password [email or web address]  [employer]  Office  [phone]  CC: Primary care physician; Patient, No Pcp Per   Note: This dictation was prepared with Dragon dictation along with smaller phrase technology. Any transcriptional errors that result from this process are unintentional.

## 2017-06-17 NOTE — ED Notes (Signed)
Patient transported to CT 

## 2017-06-17 NOTE — Progress Notes (Signed)
MD paged to notify of pt BP of 188/109  and pt c/o pain 10/10 in luq of abdominal pain "like someone is squeezing me from the inside" pt is diaphoretic. Spoke with MD and MD to put orders in for labetalol and GI consult. And a verbal order for Morphine 2mg  iv x 1

## 2017-06-17 NOTE — ED Triage Notes (Signed)
Pt presents from home with acute onset of increased LUQ abd pain/chest pain. He is with his fiance. She states he has had pain in his abd x 5 years and has had a variety of work ups lastly last week at a hospital in [place]. He is waiting to go see the GI specialist. Tonight around 9pm his pain increased to 10/10. He is diaphoretic and moaning. His heart rate is stable. Dr Zenda AlpersWebster to bedside during triage

## 2017-06-17 NOTE — Progress Notes (Signed)
C/o nausea and vomiting since taking Maalox.  Zofran 4 mg. Given slow IVP.

## 2017-06-17 NOTE — ED Notes (Signed)
Patient requesting to leave AMA. Patient cautioned against the dangers of leaving against medical advice. Patient verbalized understanding of all issues discussed. Patient requesting work excuse not. Patient informed that note would be provided. MD informed.

## 2017-06-17 NOTE — ED Provider Notes (Signed)
[facility] Emergency Department Provider Note   ____________________________________________   First MD Initiated Contact with Patient 06/16/17 2357     (approximate)  I have reviewed the triage vital signs and the nursing notes.   HISTORY  Chief Complaint Abdominal Pain and Chest Pain    HPI Kirk Barker is a 42 y.o. male who comes into the hospital today with some epigastric pain.  The patient went to the GI clinic in [place] and was diagnosed with median arcuate ligament syndrome.  He reports that he has been [place] and they are waiting for a call for an appointment.  Tonight the patient was having some severe chest and abdominal pain.  He reports that he has had multiple studies done including CT scans EKGs and nothing has ever been discovered.  They saw GI physician who listen to his symptoms and felt that he had median arcuate ligament syndrome.  The patient is having some nausea and he states some crushing and squeezing pain in his epigastric area.  He rates the pain a 10 out of 10 in intensity.  His blood pressure is elevated.  The patient significant other states that he has been having 5 years of symptoms.  The patient is here today for evaluation.   Past Medical History:  Diagnosis Date  . Median arcuate ligament syndrome [facility])     Patient Active Problem List   Diagnosis Date Noted  . Pancreatitis 06/17/2017    Past Surgical History:  Procedure Laterality Date  . CHOLECYSTECTOMY      Prior to Admission medications   Not on File    Allergies Patient has no known allergies.  No family history on file.  Social History Social History   Tobacco Use  . Smoking status: Former Games developer  . Smokeless tobacco: Never Used  Substance Use Topics  . Alcohol use: No    Frequency: Never  . Drug use: Not on file    Review of Systems  Constitutional: No fever/chills Eyes: No visual changes. ENT: No  sore throat. Cardiovascular:  chest pain. Respiratory:  shortness of breath. Gastrointestinal:  abdominal pain.   nausea, no vomiting.  No diarrhea.  No constipation. Genitourinary: Negative for dysuria. Musculoskeletal: Negative for back pain. Skin: Negative for rash. Neurological: Negative for headaches, focal weakness or numbness.   ____________________________________________   PHYSICAL EXAM:  VITAL SIGNS: ED Triage Vitals  Enc Vitals Group     BP 06/17/17 0000 (!) 186/109     Pulse Rate 06/17/17 0000 96     Resp 06/17/17 0000 17     Temp 06/17/17 0032 98.2 F (36.8 C)     Temp Source 06/17/17 0032 Oral     SpO2 06/17/17 0000 99 %     Weight 06/16/17 2359 220 lb (99.8 kg)     Height 06/16/17 2359 6' (1.829 m)     Head Circumference --      Peak Flow --      Pain Score 06/16/17 2358 10     Pain Loc --      Pain Edu? --      Excl. in GC? --     Constitutional: Alert and oriented. Well appearing and in moderate distress. Eyes: Conjunctivae are normal. PERRL. EOMI. Head: Atraumatic. Nose: No congestion/rhinnorhea. Mouth/Throat: Mucous membranes are moist.  Oropharynx non-erythematous. Cardiovascular: Normal rate, regular rhythm. Grossly normal heart sounds.  Good peripheral circulation. Respiratory: Normal respiratory effort.  No retractions. Lungs  CTAB. Gastrointestinal: Soft with some epigastric tenderness to palpation. No distention. Positive bowel sounds Musculoskeletal: No lower extremity tenderness nor edema.   Neurologic:  Normal speech and language.  Skin:  Skin is warm, dry and intact.  Psychiatric: Mood and affect are normal.   ____________________________________________   LABS (all labs ordered are listed, but only abnormal results are displayed)  Labs Reviewed  LIPASE, BLOOD - Abnormal; Notable for the following components:      Result Value   Lipase 62 (*)    All other components within normal limits  COMPREHENSIVE METABOLIC PANEL - Abnormal;  Notable for the following components:   Potassium 3.4 (*)    Glucose, Bld 176 (*)    All other components within normal limits  CBC - Abnormal; Notable for the following components:   RBC 4.37 (*)    All other components within normal limits  TROPONIN I  TROPONIN I  URINALYSIS, COMPLETE (UACMP) WITH MICROSCOPIC   ____________________________________________  EKG  ED ECG REPORT I, Rebecka ApleyWebster,  Tymeka Privette P, the attending physician, personally viewed and interpreted this ECG.   Date: 06/16/2017  EKG Time: 2358  Rate: 107  Rhythm: sinus tachycardia  Axis: normal  Intervals:none  ST&T Change: none  ____________________________________________  RADIOLOGY  Ct Angio Chest/abd/pel For Dissection W And/or Wo Contrast  Result Date: 06/17/2017 CLINICAL DATA:  Chest pain. EXAM: CT ANGIOGRAPHY CHEST, ABDOMEN AND PELVIS TECHNIQUE: Multidetector CT imaging through the chest, abdomen and pelvis was performed using the standard protocol during bolus administration of intravenous contrast. Multiplanar reconstructed images and MIPs were obtained and reviewed to evaluate the vascular anatomy. CONTRAST:  100mL ISOVUE-370 IOPAMIDOL (ISOVUE-370) INJECTION 76% COMPARISON:  None FINDINGS: CTA CHEST FINDINGS Cardiovascular: Preferential opacification of the thoracic aorta. No evidence of thoracic aortic aneurysm or dissection. Normal heart size. No pericardial effusion. Mediastinum/Nodes: No enlarged mediastinal, hilar, or axillary lymph nodes. Thyroid gland, trachea, and esophagus demonstrate no significant findings. Lungs/Pleura: No pleural effusion. No airspace consolidation or atelectasis. Musculoskeletal: No chest wall abnormality. No acute or significant osseous findings. Review of the MIP images confirms the above findings. CTA ABDOMEN AND PELVIS FINDINGS VASCULAR Aorta: Normal caliber aorta without aneurysm, dissection, vasculitis or significant stenosis. Celiac: Patent without evidence of aneurysm,  dissection, vasculitis or significant stenosis. SMA: Patent without evidence of aneurysm, dissection, vasculitis or significant stenosis. Renals: Both renal arteries are patent without evidence of aneurysm, dissection, vasculitis, fibromuscular dysplasia or significant stenosis. IMA: Patent without evidence of aneurysm, dissection, vasculitis or significant stenosis. Inflow: Patent without evidence of aneurysm, dissection, vasculitis or significant stenosis. Veins: No obvious venous abnormality within the limitations of this arterial phase study. Review of the MIP images confirms the above findings. NON-VASCULAR Hepatobiliary: No focal liver abnormality is seen. Status post cholecystectomy. No biliary dilatation. Pancreas: Unremarkable. No pancreatic ductal dilatation or surrounding inflammatory changes. Spleen: Normal in size without focal abnormality. Adrenals/Urinary Tract: Adrenal glands are unremarkable. Kidneys are normal, without renal calculi, focal lesion, or hydronephrosis. Bladder is unremarkable. Stomach/Bowel: Stomach is within normal limits. Appendix appears normal. No evidence of bowel wall thickening, distention, or inflammatory changes. Lymphatic: No significant vascular findings are present. No enlarged abdominal or pelvic lymph nodes. Reproductive: Prostate is unremarkable. Other: No abdominal wall hernia or abnormality. No abdominopelvic ascites. Musculoskeletal: No acute or significant osseous findings. Review of the MIP images confirms the above findings. IMPRESSION: 1. No acute findings identified.  No evidence for aortic dissection. 2. Previous cholecystectomy. Electronically Signed   By: Veronda Prudeaylor  Stroud M.D.  On: 06/17/2017 01:27    ____________________________________________   PROCEDURES  Procedure(s) performed: None  Procedures  Critical Care performed: No  ____________________________________________   INITIAL IMPRESSION / ASSESSMENT AND PLAN / ED COURSE  As part of my  medical decision making, I reviewed the following data within the electronic MEDICAL RECORD NUMBER Notes from prior ED visits and [place] Controlled Substance Database   This is a 42 year old male who comes into the hospital today with some epigastric pain as well as chest and abdominal pain.  The patient's wife states that he has been having this for multiple years.  She states that he was told he may have median arcuate ligament syndrome.  We will give the patient a dose of nitroglycerin as well as morphine and Zofran.  My differential diagnosis includes acute coronary syndrome, pneumothorax, pancreatitis  I did check some blood work to include a CBC CMP and a lipase.  The patient's lipase has some mild elevation at 62.  The patient's troponin is in process.  I will send him for a CTA of his chest abdomen and pelvis.  He will be reassessed once he is received all of his studies.    Patient CT scan is unremarkable and his blood work is unremarkable.  He received a second dose of morphine as well as a dose of Dilaudid and Toradol.  He was still saying that he had some pain.  I did order some Percocet but the patient states that the Percocet makes his belly pain worse.  I also ordered a GI cocktail and the patient again said that it makes his pain worse.  The patient and his wife started stating that they wanted to leave AGAINST MEDICAL ADVICE.  We filled out his paperwork within the patient changed his mind.  He stated that he could not tolerate the pain at home.  I did offer for the patient to be admitted to the hospitalist service and the patient states that he would like to be admitted.  I will give the patient a dose of Haldol in fluids to see if that does help his pain.  The patient will be admitted to the hospitalist service for intractable abdominal pain.  ____________________________________________   FINAL CLINICAL IMPRESSION(S) / ED DIAGNOSES  Final diagnoses:  Epigastric pain     ED Discharge  Orders    None       Note:  This document was prepared using Dragon voice recognition software and may include unintentional dictation errors.    Rebecka Apley, MD 06/17/17 [phone]

## 2017-06-25 ENCOUNTER — Emergency Department
Admission: EM | Admit: 2017-06-25 | Discharge: 2017-06-25 | Disposition: A | Discharge status: Home / Self Care | Payer: BLUE CROSS/BLUE SHIELD | Attending: Emergency Medicine | Admitting: Emergency Medicine

## 2017-06-25 ENCOUNTER — Other Ambulatory Visit: Payer: Self-pay

## 2017-06-25 DIAGNOSIS — R109 Unspecified abdominal pain: Secondary | ICD-10-CM

## 2017-06-25 DIAGNOSIS — R1013 Epigastric pain: Secondary | ICD-10-CM | POA: Diagnosis not present

## 2017-06-25 DIAGNOSIS — G8929 Other chronic pain: Secondary | ICD-10-CM | POA: Insufficient documentation

## 2017-06-25 DIAGNOSIS — Z87891 Personal history of nicotine dependence: Secondary | ICD-10-CM | POA: Insufficient documentation

## 2017-06-25 DIAGNOSIS — Z9049 Acquired absence of other specified parts of digestive tract: Secondary | ICD-10-CM | POA: Diagnosis not present

## 2017-06-25 LAB — BASIC METABOLIC PANEL
ANION GAP: 12 (ref 5–15)
BUN: 7 mg/dL (ref 6–20)
CHLORIDE: 101 mmol/L (ref 101–111)
CO2: 23 mmol/L (ref 22–32)
Calcium: 9.3 mg/dL (ref 8.9–10.3)
Creatinine, Ser: 1.16 mg/dL (ref 0.61–1.24)
GFR calc Af Amer: >60 mL/min (ref >60)
GLUCOSE: 206 mg/dL — AB (ref 65–99)
Potassium: 3.2 mmol/L — ABNORMAL LOW (ref 3.5–5.1)
Sodium: 136 mmol/L (ref 135–145)

## 2017-06-25 LAB — CBC
HEMATOCRIT: 41.9 % (ref 40.0–52.0)
HEMOGLOBIN: 14.3 g/dL (ref 13.0–18.0)
MCH: 31.6 pg (ref 26.0–34.0)
MCHC: 34 g/dL (ref 32.0–36.0)
MCV: 92.7 fL (ref 80.0–100.0)
Platelets: 284 10*3/uL (ref 150–440)
RBC: 4.52 MIL/uL (ref 4.40–5.90)
RDW: 13.6 % (ref 11.5–14.5)
WBC: 8.8 10*3/uL (ref 3.8–10.6)

## 2017-06-25 LAB — TROPONIN I: Troponin I: <0.03 ng/mL (ref <0.03)

## 2017-06-25 NOTE — ED Provider Notes (Addendum)
[facility] Emergency Department Provider Note       Time seen: ----------------------------------------- 6:51 AM on 06/25/2017 -----------------------------------------   I have reviewed the triage vital signs and the nursing notes.  HISTORY   Chief Complaint Chest Pain and Abdominal Pain    HPI Kirk Barker is a 42 y.o. male with a history of median arcuate ligament syndrome who presents to the ED for abdominal pain since 0500. Patient was tachycardic and diaphoretic. EMS gave fentanyl IV for pain with some improvement. Reportedly he was just admitted into the hospital for this and diagnosed with acute on chronic pain. There is concern for celiac artery compression according to his primary care doctor. Pain is 10/10 and epigastric.  Past Medical History:  Diagnosis Date  . Median arcuate ligament syndrome [facility])     Patient Active Problem List   Diagnosis Date Noted  . Pancreatitis 06/17/2017    Past Surgical History:  Procedure Laterality Date  . CHOLECYSTECTOMY      Allergies Patient has no known allergies.  Social History Social History   Tobacco Use  . Smoking status: Former Games developer  . Smokeless tobacco: Never Used  Substance Use Topics  . Alcohol use: No    Frequency: Never  . Drug use: No   Review of Systems Constitutional: Negative for fever. Cardiovascular: Negative for chest pain. Respiratory: Negative for shortness of breath. Gastrointestinal: Positive for abdominal pain Musculoskeletal: Negative for back pain. Skin: Positive for diaphoresis Neurological: Negative for headaches, focal weakness or numbness.  All systems negative/normal/unremarkable except as stated in the HPI  ____________________________________________   PHYSICAL EXAM:  VITAL SIGNS: ED Triage Vitals  Enc Vitals Group     BP 06/25/17 0641 (!) 183/111     Pulse Rate 06/25/17 0641 (!) 103     Resp 06/25/17 0639 (!) 22     Temp 06/25/17 0639 98.7  F (37.1 C)     Temp Source 06/25/17 0639 Oral     SpO2 06/25/17 0641 97 %     Weight 06/25/17 0639 222 lb (100.7 kg)     Height 06/25/17 0639 6' (1.829 m)     Head Circumference --      Peak Flow --      Pain Score 06/25/17 0639 3     Pain Loc --      Pain Edu? --      Excl. in GC? --    Constitutional: Alert,  Distress from pain ENT   Head: Normocephalic and atraumatic.   Nose: No congestion/rhinnorhea.   Mouth/Throat: Mucous membranes are moist.   Neck: No stridor. Cardiovascular: Normal rate, regular rhythm. No murmurs, rubs, or gallops. Respiratory: Normal respiratory effort without tachypnea nor retractions. Breath sounds are clear and equal bilaterally. No wheezes/rales/rhonchi. Gastrointestinal: epigastric tenderness, no rebound or guarding, positive bowel sounds Musculoskeletal: Nontender with normal range of motion in extremities. No lower extremity tenderness nor edema. Neurologic:  Normal speech and language. No gross focal neurologic deficits are appreciated.  Skin:  Skin is warm, dry and intact. No rash noted. Psychiatric: patient somewhat agitated from pain ____________________________________________  ED COURSE:  As part of my medical decision making, I reviewed the following data within the electronic MEDICAL RECORD NUMBER History obtained from family if available, nursing notes, old chart and ekg, as well as notes from prior ED visits. Patient presented for abdominal pain, we will assess with labs and imaging as indicated at this time.   Procedures ____________________________________________   EKG: NSR, rate  is 97 beats per minute, normal axis, normal intervals, no evidence of hypertrophy  LABS (pertinent positives/negatives)  Labs Reviewed  BASIC METABOLIC PANEL - Abnormal; Notable for the following components:      Result Value   Potassium 3.2 (*)    Glucose, Bld 206 (*)    All other components within normal limits  CBC  TROPONIN I   ____________________________________________  DIFFERENTIAL DIAGNOSIS   Chronic pain, celiac artery compression, pancreatitis, gastritis   FINAL ASSESSMENT AND PLAN  Chronic pain   Plan: Patient had presented for chronic pain. Patient's labs are normal. I have advised him and the family that there is nothing more that we can offer him here today. This issue has been present for at least 5 years with no diagnosis. Testing here recently via admission and today have been negative. I will refer him to pain management for follow up.    Emily FilbertWilliams, Jonathan E, MD   Note: This note was generated in part or whole with voice recognition software. Voice recognition is usually quite accurate but there are transcription errors that can and very often do occur. I apologize for any typographical errors that were not detected and corrected.     Emily FilbertWilliams, Jonathan E, MD 06/25/17 [id number]    Emily FilbertWilliams, Jonathan E, MD 06/25/17 [phone]

## 2017-06-25 NOTE — ED Triage Notes (Addendum)
Pt with luq pain since 0500. Ems states on their arrival pt with hr in 140s, diaphoretic and "cleanched" in pain. Ems gave of fentanyl with improvement in pain from 10/10 to 3/10. Pt was recently diagnosed with "celiac artery compression" per ems per family.

## 2017-06-25 NOTE — ED Notes (Signed)
Pt triaged by this nurse last weekend and reported he was diagnosed with median arcuate ligament syndrome; pt was admitted last week and discharged home after further evaluation, prescribed percocet and zofran; pt says he has had chest pain intermittently since last week, this episode waking him from sleep this am; pt admits to not taking any pain or nausea medication as the percocet gives him abd pain, makes him vomit, and this makes his chest pain worse; pt says the pain has been a constant squeezing pain since it started this am; pt says Dilaudid is the only medication that eases his pain;

## 2017-06-26 ENCOUNTER — Encounter (INDEPENDENT_AMBULATORY_CARE_PROVIDER_SITE_OTHER): Payer: Self-pay | Admitting: Vascular Surgery

## 2017-06-26 ENCOUNTER — Ambulatory Visit (INDEPENDENT_AMBULATORY_CARE_PROVIDER_SITE_OTHER): Payer: BLUE CROSS/BLUE SHIELD | Admitting: Vascular Surgery

## 2017-06-26 VITALS — BP 139/87 | HR 89 | Resp 18 | Ht 72.0 in | Wt 221.2 lb

## 2017-06-26 DIAGNOSIS — I774 Celiac artery compression syndrome: Secondary | ICD-10-CM | POA: Diagnosis not present

## 2017-06-26 DIAGNOSIS — R1013 Epigastric pain: Secondary | ICD-10-CM | POA: Insufficient documentation

## 2017-06-26 NOTE — Assessment & Plan Note (Signed)
Patient has been told he had median arcuate ligament syndrome.  His symptoms are somewhat consistent with that in the long-standing nature of the with negative workup otherwise would also be consistent with this in a reasonably healthy otherwise young patient.  He has had weight loss and difficulty eating.  He has undergone a CT scan of the abdomen and pelvis as well as the chest which I have independently reviewed.  He essentially has no atherosclerotic disease of his vessels and no flow limitation within the visceral vessels.  The celiac artery was widely patent on this study.  This was however a standard CT scan only with inspiration and separate inspiration and expiration phases were not ordered or performed.  We are going to get a mesenteric duplex with inspiration and expiration phases to try to delineate whether or not some degree of celiac artery compression is occurring.  I think he should be seen by specialized [facility] Center, and the closest one I know of [facility] at [facility].  He is going to get his duplex in the near future and we can consider referral to them following that.

## 2017-06-26 NOTE — Assessment & Plan Note (Signed)
Although the etiology is not entirely clear, he has been told he has median arcuate ligament syndrome and he certainly could.  CT scan as discussed above.  Further workup as discussed above.

## 2017-06-26 NOTE — Progress Notes (Signed)
[name], male   DOB: [birth year], 42 y.o.   MRN: [id number]  Chief Complaint  Patient presents with  . New Patient (Initial Visit)    ref Kirk Barker for abdomen pain    HPI Kirk Barker is a 42 y.o. male.  I am asked to see the patient by Kirk Barker for evaluation of abdominal pain.  The patient reports 4-6 years of abdominal pain and left lower chest pain.  It sounds like he has had extensive workup including multiple CT scans, ultrasounds, as well as 3 upper endoscopies.  At this point, he says basically they have been undecided on what his problem is.  Recently, he was told that he had median arcuate ligament syndrome and this is the reason for further evaluation in our office.  On a visit to the emergency department earlier this month, he had a CT scan of the abdomen and pelvis as well as the chest which I have independently reviewed.  He essentially has no atherosclerotic disease of his vessels and no flow limitation within the visceral vessels.  The celiac artery was widely patent on this study.  This was however a standard CT scan only with inspiration and separate inspiration and expiration phases were not ordered or performed.   Past Medical History:  Diagnosis Date  . Median arcuate ligament syndrome [facility])     Past Surgical History:  Procedure Laterality Date  . CHOLECYSTECTOMY      Family History  Problem Relation Age of Onset  . Stroke Mother   . Diabetes Father   . Brain cancer Maternal Grandmother   No bleeding or clotting disorders  Social History Social History   Tobacco Use  . Smoking status: Former Research scientist (life sciences)  . Smokeless tobacco: Never Used  Substance Use Topics  . Alcohol use: No    Frequency: Never  . Drug use: No    Allergies  Allergen Reactions  . Other Itching    Current Outpatient Medications  Medication Sig Dispense Refill  . diltiazem (CARDIZEM CD) 120 MG 24 hr capsule Take 1 capsule (120 mg total) by mouth daily. (Patient not taking:  Reported on 06/26/2017) 90 capsule 0  . metoCLOPramide (REGLAN) 5 MG tablet Take 1 tablet (5 mg total) by mouth 4 (four) times daily -  before meals and at bedtime. (Patient not taking: Reported on 06/26/2017) 90 tablet 0  . metoprolol tartrate (LOPRESSOR) 100 MG tablet Take 1 tablet (100 mg total) by mouth 2 (two) times daily. (Patient not taking: Reported on 06/26/2017) 60 tablet 0  . oxyCODONE-acetaminophen (PERCOCET/ROXICET) 5-325 MG tablet Take 1-2 tablets by mouth every 8 (eight) hours as needed for severe pain. (Patient not taking: Reported on 06/25/2017) 10 tablet 0  . pantoprazole (PROTONIX) 40 MG tablet Take 1 tablet (40 mg total) by mouth daily. (Patient not taking: Reported on 06/26/2017) 30 tablet 0   No current facility-administered medications for this visit.       REVIEW OF SYSTEMS (Negative unless checked)  Constitutional: [x] Weight loss  [] Fever  [] Chills Cardiac: [x] Chest pain   [] Chest pressure   [] Palpitations   [] Shortness of breath when laying flat   [] Shortness of breath at rest   [] Shortness of breath with exertion. Vascular:  [] Pain in legs with walking   [] Pain in legs at rest   [] Pain in legs when laying flat   [] Claudication   [] Pain in feet when walking  [] Pain in feet at rest  [] Pain in feet when laying flat   []   History of DVT   [] Phlebitis   [] Swelling in legs   [] Varicose veins   [] Non-healing ulcers Pulmonary:   [] Uses home oxygen   [] Productive cough   [] Hemoptysis   [] Wheeze  [] COPD   [] Asthma Neurologic:  [] Dizziness  [] Blackouts   [] Seizures   [] History of stroke   [] History of TIA  [] Aphasia   [] Temporary blindness   [] Dysphagia   [] Weakness or numbness in arms   [] Weakness or numbness in legs Musculoskeletal:  [] Arthritis   [] Joint swelling   [] Joint pain   [] Low back pain Hematologic:  [] Easy bruising  [] Easy bleeding   [] Hypercoagulable state   [] Anemic  [] Hepatitis Gastrointestinal:  [] Blood in stool   [] Vomiting blood  [x] Gastroesophageal reflux/heartburn    [x] Abdominal pain Genitourinary:  [] Chronic kidney disease   [] Difficult urination  [] Frequent urination  [] Burning with urination   [] Hematuria Skin:  [] Rashes   [] Ulcers   [] Wounds Psychological:  [] History of anxiety   []  History of major depression.    Physical Exam BP 139/87 (BP Location: Right Arm)   Pulse 89   Resp 18   Ht 6' (1.829 m)   Wt 221 lb 3.2 oz (100.3 kg)   BMI 30.00 kg/m  Gen:  WD/WN, NAD Head: [place]/AT, No temporalis wasting.  Ear/Nose/Throat: Hearing grossly intact, nares w/o erythema or drainage, oropharynx w/o Erythema/Exudate Eyes: Conjunctiva clear, sclera non-icteric  Neck: trachea midline.  No JVD.  Pulmonary:  Good air movement, respirations not labored, no use of accessory muscles Cardiac: RRR, no JVD Vascular:  Vessel Right Left  Radial Palpable Palpable                                   Gastrointestinal: soft, non-tender/non-distended. Musculoskeletal: M/S 5/5 throughout.  Extremities without ischemic changes.  No deformity or atrophy. Neurologic: Sensation grossly intact in extremities.  Symmetrical.  Speech is fluent. Motor exam as listed above. Psychiatric: Judgment intact, Mood & affect appropriate for pt's clinical situation. Dermatologic: No rashes or ulcers noted.  No cellulitis or open wounds.    Radiology Ct Angio Chest/abd/pel For Dissection W And/or Wo Contrast  Result Date: 06/17/2017 CLINICAL DATA:  Chest pain. EXAM: CT ANGIOGRAPHY CHEST, ABDOMEN AND PELVIS TECHNIQUE: Multidetector CT imaging through the chest, abdomen and pelvis was performed using the standard protocol during bolus administration of intravenous contrast. Multiplanar reconstructed images and MIPs were obtained and reviewed to evaluate the vascular anatomy. CONTRAST:  185m ISOVUE-370 IOPAMIDOL (ISOVUE-370) INJECTION 76% COMPARISON:  None FINDINGS: CTA CHEST FINDINGS Cardiovascular: Preferential opacification of the thoracic aorta. No evidence of thoracic aortic  aneurysm or dissection. Normal heart size. No pericardial effusion. Mediastinum/Nodes: No enlarged mediastinal, hilar, or axillary lymph nodes. Thyroid gland, trachea, and esophagus demonstrate no significant findings. Lungs/Pleura: No pleural effusion. No airspace consolidation or atelectasis. Musculoskeletal: No chest wall abnormality. No acute or significant osseous findings. Review of the MIP images confirms the above findings. CTA ABDOMEN AND PELVIS FINDINGS VASCULAR Aorta: Normal caliber aorta without aneurysm, dissection, vasculitis or significant stenosis. Celiac: Patent without evidence of aneurysm, dissection, vasculitis or significant stenosis. SMA: Patent without evidence of aneurysm, dissection, vasculitis or significant stenosis. Renals: Both renal arteries are patent without evidence of aneurysm, dissection, vasculitis, fibromuscular dysplasia or significant stenosis. IMA: Patent without evidence of aneurysm, dissection, vasculitis or significant stenosis. Inflow: Patent without evidence of aneurysm, dissection, vasculitis or significant stenosis. Veins: No obvious venous abnormality within the limitations of this arterial  phase study. Review of the MIP images confirms the above findings. NON-VASCULAR Hepatobiliary: No focal liver abnormality is seen. Status post cholecystectomy. No biliary dilatation. Pancreas: Unremarkable. No pancreatic ductal dilatation or surrounding inflammatory changes. Spleen: Normal in size without focal abnormality. Adrenals/Urinary Tract: Adrenal glands are unremarkable. Kidneys are normal, without renal calculi, focal lesion, or hydronephrosis. Bladder is unremarkable. Stomach/Bowel: Stomach is within normal limits. Appendix appears normal. No evidence of bowel wall thickening, distention, or inflammatory changes. Lymphatic: No significant vascular findings are present. No enlarged abdominal or pelvic lymph nodes. Reproductive: Prostate is unremarkable. Other: No abdominal  wall hernia or abnormality. No abdominopelvic ascites. Musculoskeletal: No acute or significant osseous findings. Review of the MIP images confirms the above findings. IMPRESSION: 1. No acute findings identified.  No evidence for aortic dissection. 2. Previous cholecystectomy. Electronically Signed   By: Kerby Moors M.D.   On: 06/17/2017 01:27    Labs Recent Results (from the past 2160 hour(s))  Lipase, blood     Status: Abnormal   Collection Time: 06/16/17 11:59 PM  Result Value Ref Range   Lipase 62 (H) 11 - 51 U/L    Comment: Performed at [facility], [address] [place], [place] [address]  Comprehensive metabolic panel     Status: Abnormal   Collection Time: 06/16/17 11:59 PM  Result Value Ref Range   Sodium 138 135 - 145 mmol/L   Potassium 3.4 (L) 3.5 - 5.1 mmol/L   Chloride 105 101 - 111 mmol/L   CO2 22 22 - 32 mmol/L   Glucose, Bld 176 (H) 65 - 99 mg/dL   BUN 9 6 - 20 mg/dL   Creatinine, Ser 1.14 0.61 - 1.24 mg/dL   Calcium 9.1 8.9 - 10.3 mg/dL   Total Protein 7.9 6.5 - 8.1 g/dL   Albumin 4.2 3.5 - 5.0 g/dL   AST 35 15 - 41 U/L   ALT 25 17 - 63 U/L   Alkaline Phosphatase 81 38 - 126 U/L   Total Bilirubin 0.6 0.3 - 1.2 mg/dL   GFR calc non Af Amer >60 >60 mL/min   GFR calc Af Amer >60 >60 mL/min    Comment: (NOTE) The eGFR has been calculated using the CKD EPI equation. This calculation has not been validated in all clinical situations. eGFR's persistently <60 mL/min signify possible Chronic Kidney Disease.    Anion gap 11 5 - 15    Comment: Performed at [facility], [address] [place], [place] [address]  CBC     Status: Abnormal   Collection Time: 06/16/17 11:59 PM  Result Value Ref Range   WBC 6.5 3.8 - 10.6 K/uL   RBC 4.37 (L) 4.40 - 5.90 MIL/uL   Hemoglobin 14.0 13.0 - 18.0 g/dL   HCT 40.4 40.0 - 52.0 %   MCV 92.4 80.0 - 100.0 fL   MCH 32.1 26.0 - 34.0 pg   MCHC 34.7 32.0 - 36.0 g/dL   RDW 13.8 11.5 - 14.5 %   Platelets 243  150 - 440 K/uL    Comment: Performed at [facility], [address] [place], [place] [address]  Troponin I     Status: None   Collection Time: 06/16/17 11:59 PM  Result Value Ref Range   Troponin I <0.03 <0.03 ng/mL    Comment: Performed at [facility], [address], [place],  [address]  Troponin I     Status: None   Collection Time: 06/17/17  3:36  AM  Result Value Ref Range   Troponin I <0.03 <0.03 ng/mL    Comment: Performed at [facility], [address] [place], [place] [address]  Urinalysis, Complete w Microscopic     Status: Abnormal   Collection Time: 06/17/17  5:47 AM  Result Value Ref Range   Color, Urine STRAW (A) YELLOW   APPearance CLEAR (A) CLEAR   Specific Gravity, Urine 1.017 1.005 - 1.030   pH 6.0 5.0 - 8.0   Glucose, UA 150 (A) NEGATIVE mg/dL   Hgb urine dipstick SMALL (A) NEGATIVE   Bilirubin Urine NEGATIVE NEGATIVE   Ketones, ur 5 (A) NEGATIVE mg/dL   Protein, ur 30 (A) NEGATIVE mg/dL   Nitrite NEGATIVE NEGATIVE   Leukocytes, UA NEGATIVE NEGATIVE   RBC / HPF 0-5 0 - 5 RBC/hpf   WBC, UA 0-5 0 - 5 WBC/hpf   Bacteria, UA NONE SEEN NONE SEEN   Squamous Epithelial / LPF NONE SEEN NONE SEEN    Comment: Performed at [facility], [address] [place], [place] [address]  HIV antibody (Routine Testing)     Status: None (Preliminary result)   Collection Time: 06/17/17  7:56 AM  Result Value Ref Range   HIV Screen 4th Generation wRfx Non Reactive Non Reactive    Comment: (NOTE) Performed At: [facility] [place], [place] [id number] Rush Farmer MD [phone] Performed at [facility], [address] [place], [place] [address]   Sedimentation rate     Status: Abnormal   Collection Time: 06/17/17  7:56 AM  Result Value Ref Range   Sed Rate 38 (H) 0 - 15 mm/hr    Comment: Performed at [facility], [address] [place], [place] [address]  Lactic acid, plasma      Status: None   Collection Time: 06/17/17  7:56 AM  Result Value Ref Range   Lactic Acid, Venous 1.3 0.5 - 1.9 mmol/L    Comment: Performed at [facility], [address] [place], [place] [address]  Lactic acid, plasma     Status: None   Collection Time: 06/17/17 11:15 AM  Result Value Ref Range   Lactic Acid, Venous 1.5 0.5 - 1.9 mmol/L    Comment: Performed at [facility], [address] [place], [place] [address]  Basic metabolic panel     Status: Abnormal   Collection Time: 06/25/17  6:38 AM  Result Value Ref Range   Sodium 136 135 - 145 mmol/L   Potassium 3.2 (L) 3.5 - 5.1 mmol/L   Chloride 101 101 - 111 mmol/L   CO2 23 22 - 32 mmol/L   Glucose, Bld 206 (H) 65 - 99 mg/dL   BUN 7 6 - 20 mg/dL   Creatinine, Ser 1.16 0.61 - 1.24 mg/dL   Calcium 9.3 8.9 - 10.3 mg/dL   GFR calc non Af Amer >60 >60 mL/min   GFR calc Af Amer >60 >60 mL/min    Comment: (NOTE) The eGFR has been calculated using the CKD EPI equation. This calculation has not been validated in all clinical situations. eGFR's persistently <60 mL/min signify possible Chronic Kidney Disease.    Anion gap 12 5 - 15    Comment: Performed at [facility], [address] [place], [place] [address]  CBC     Status: None   Collection Time: 06/25/17  6:38 AM  Result Value Ref Range   WBC 8.8 3.8 - 10.6 K/uL  RBC 4.52 4.40 - 5.90 MIL/uL   Hemoglobin 14.3 13.0 - 18.0 g/dL   HCT 41.9 40.0 - 52.0 %   MCV 92.7 80.0 - 100.0 fL   MCH 31.6 26.0 - 34.0 pg   MCHC 34.0 32.0 - 36.0 g/dL   RDW 13.6 11.5 - 14.5 %   Platelets 284 150 - 440 K/uL    Comment: Performed at [facility], [address] [place], [place] [address]  Troponin I     Status: None   Collection Time: 06/25/17  6:38 AM  Result Value Ref Range   Troponin I <0.03 <0.03 ng/mL    Comment: Performed at [facility], [address] [place], [place] [address]    Assessment/Plan:  Median arcuate ligament  syndrome [facility]) Patient has been told he had median arcuate ligament syndrome.  His symptoms are somewhat consistent with that in the long-standing nature of the with negative workup otherwise would also be consistent with this in a reasonably healthy otherwise young patient.  He has had weight loss and difficulty eating.  He has undergone a CT scan of the abdomen and pelvis as well as the chest which I have independently reviewed.  He essentially has no atherosclerotic disease of his vessels and no flow limitation within the visceral vessels.  The celiac artery was widely patent on this study.  This was however a standard CT scan only with inspiration and separate inspiration and expiration phases were not ordered or performed.  We are going to get a mesenteric duplex with inspiration and expiration phases to try to delineate whether or not some degree of celiac artery compression is occurring.  I think he should be seen by specialized [facility], and the closest one I know of is at [facility].  He is going to get his duplex in the near future and we can consider referral to them following that.  Abdominal pain, epigastric Although the etiology is not entirely clear, he has been told he has median arcuate ligament syndrome and he certainly could.  CT scan as discussed above.  Further workup as discussed above.      Leotis Pain 06/26/2017, 3:39 PM   This note was created with Dragon medical transcription system.  Any errors from dictation are unintentional.

## 2017-06-27 ENCOUNTER — Other Ambulatory Visit (INDEPENDENT_AMBULATORY_CARE_PROVIDER_SITE_OTHER): Payer: BLUE CROSS/BLUE SHIELD

## 2017-06-27 DIAGNOSIS — I774 Celiac artery compression syndrome: Secondary | ICD-10-CM | POA: Diagnosis not present

## 2017-06-28 ENCOUNTER — Telehealth (INDEPENDENT_AMBULATORY_CARE_PROVIDER_SITE_OTHER): Payer: Self-pay

## 2017-06-28 NOTE — Telephone Encounter (Signed)
I figured it out by R&D it is GI.  Thanks

## 2017-06-28 NOTE — Telephone Encounter (Signed)
Patient called and stated that the [facility] doesn't know what the MALS center is, and he would like to know which specialty he needs to be referred to.  Chrissie NoaWilliam also called to [facility] and they had no clue what the MALS center was?  Please direct us?

## 2017-07-03 ENCOUNTER — Telehealth (INDEPENDENT_AMBULATORY_CARE_PROVIDER_SITE_OTHER): Payer: Self-pay | Admitting: Vascular Surgery

## 2017-07-03 NOTE — Telephone Encounter (Signed)
Spoke with the patient and informed him of Dr. Driscilla Grammesew's recommendation regarding going to the clinic he was referred to and being seen. Patient seen okay with this information.

## 2017-07-03 NOTE — Telephone Encounter (Signed)
Patient called states he is waiting on a callback on his US results from  1/30.

## 2017-07-30 ENCOUNTER — Encounter: Payer: BLUE CROSS/BLUE SHIELD | Admitting: Vascular Surgery

## 2017-07-30 ENCOUNTER — Encounter (HOSPITAL_COMMUNITY): Payer: BLUE CROSS/BLUE SHIELD

## 2017-09-21 LAB — HIV ANTIBODY (ROUTINE TESTING W REFLEX): HIV Screen 4th Generation wRfx: NONREACTIVE

## 2017-09-25 ENCOUNTER — Telehealth (INDEPENDENT_AMBULATORY_CARE_PROVIDER_SITE_OTHER): Payer: Self-pay | Admitting: Vascular Surgery

## 2017-09-25 NOTE — Telephone Encounter (Signed)
Patient called and said that he has not heard anything about his referral to the MALS clinic.

## 2017-09-26 NOTE — Telephone Encounter (Signed)
Spoke with the patient regarding a referral to GI, they have called him and will be calling again tomorrow and he will accept the appt. From them and move forward. He did state they don't treat MALS. An attempt has been made on a few occasion to get him in to a clinic in [place] to no avail so a referral was sent to [facility] in order to get the patient started getting some treatment.

## 2017-10-09 DIAGNOSIS — G8929 Other chronic pain: Secondary | ICD-10-CM | POA: Diagnosis not present

## 2017-10-09 DIAGNOSIS — R1013 Epigastric pain: Secondary | ICD-10-CM | POA: Diagnosis not present

## 2017-10-09 DIAGNOSIS — R079 Chest pain, unspecified: Secondary | ICD-10-CM | POA: Diagnosis not present

## 2017-10-09 DIAGNOSIS — I774 Celiac artery compression syndrome: Secondary | ICD-10-CM | POA: Diagnosis not present

## 2017-10-09 DIAGNOSIS — F17210 Nicotine dependence, cigarettes, uncomplicated: Secondary | ICD-10-CM | POA: Diagnosis not present

## 2017-10-17 ENCOUNTER — Telehealth (INDEPENDENT_AMBULATORY_CARE_PROVIDER_SITE_OTHER): Payer: Self-pay

## 2017-10-17 NOTE — Telephone Encounter (Signed)
Spoke with the patient and the insurance company only needs the report from when he was seen, he will call them and have them request the report from the day he was seen.

## 2017-10-17 NOTE — Telephone Encounter (Signed)
The patient and his disability company is calling wanting to know about the extent of his disability and how long he will be out of work. The patient was only seen on 06/26/17 and per your note he was to be referred to a clinic in [place] which he has now be referred to. There isn't any mention of taking him out of work and per the patient he has been out of work since then. How would you like me to proceed.

## 2017-10-23 DIAGNOSIS — K219 Gastro-esophageal reflux disease without esophagitis: Secondary | ICD-10-CM | POA: Diagnosis not present

## 2017-10-23 DIAGNOSIS — M545 Low back pain: Secondary | ICD-10-CM | POA: Diagnosis not present

## 2017-10-23 DIAGNOSIS — I774 Celiac artery compression syndrome: Secondary | ICD-10-CM | POA: Diagnosis not present

## 2017-11-06 DIAGNOSIS — G8929 Other chronic pain: Secondary | ICD-10-CM | POA: Diagnosis not present

## 2017-11-06 DIAGNOSIS — R079 Chest pain, unspecified: Secondary | ICD-10-CM | POA: Diagnosis not present

## 2017-11-06 DIAGNOSIS — R1013 Epigastric pain: Secondary | ICD-10-CM | POA: Diagnosis not present

## 2017-11-06 DIAGNOSIS — F17210 Nicotine dependence, cigarettes, uncomplicated: Secondary | ICD-10-CM | POA: Diagnosis not present

## 2017-11-06 DIAGNOSIS — Z9049 Acquired absence of other specified parts of digestive tract: Secondary | ICD-10-CM | POA: Diagnosis not present

## 2017-11-09 DIAGNOSIS — R109 Unspecified abdominal pain: Secondary | ICD-10-CM | POA: Diagnosis not present

## 2017-11-09 DIAGNOSIS — J9811 Atelectasis: Secondary | ICD-10-CM | POA: Diagnosis not present

## 2017-12-07 ENCOUNTER — Emergency Department: Payer: BLUE CROSS/BLUE SHIELD

## 2017-12-07 ENCOUNTER — Other Ambulatory Visit: Payer: Self-pay

## 2017-12-07 ENCOUNTER — Emergency Department
Admission: EM | Admit: 2017-12-07 | Discharge: 2017-12-07 | Disposition: A | Discharge status: Home / Self Care | Payer: BLUE CROSS/BLUE SHIELD | Attending: Emergency Medicine | Admitting: Emergency Medicine

## 2017-12-07 DIAGNOSIS — F17200 Nicotine dependence, unspecified, uncomplicated: Secondary | ICD-10-CM | POA: Diagnosis not present

## 2017-12-07 DIAGNOSIS — Z7984 Long term (current) use of oral hypoglycemic drugs: Secondary | ICD-10-CM | POA: Diagnosis not present

## 2017-12-07 DIAGNOSIS — R000 Tachycardia, unspecified: Secondary | ICD-10-CM | POA: Diagnosis not present

## 2017-12-07 DIAGNOSIS — Z79899 Other long term (current) drug therapy: Secondary | ICD-10-CM | POA: Diagnosis not present

## 2017-12-07 DIAGNOSIS — R1013 Epigastric pain: Secondary | ICD-10-CM | POA: Insufficient documentation

## 2017-12-07 DIAGNOSIS — R109 Unspecified abdominal pain: Secondary | ICD-10-CM | POA: Diagnosis not present

## 2017-12-07 LAB — URINALYSIS, COMPLETE (UACMP) WITH MICROSCOPIC
BILIRUBIN URINE: NEGATIVE
Bacteria, UA: NONE SEEN
Hgb urine dipstick: NEGATIVE
KETONES UR: NEGATIVE mg/dL
LEUKOCYTES UA: NEGATIVE
NITRITE: NEGATIVE
PH: 6 (ref 5.0–8.0)
Protein, ur: NEGATIVE mg/dL
SPECIFIC GRAVITY, URINE: 1.007 (ref 1.005–1.030)

## 2017-12-07 LAB — CBC
HCT: 39.8 % — ABNORMAL LOW (ref 40.0–52.0)
Hemoglobin: 13.7 g/dL (ref 13.0–18.0)
MCH: 32.5 pg (ref 26.0–34.0)
MCHC: 34.5 g/dL (ref 32.0–36.0)
MCV: 94.1 fL (ref 80.0–100.0)
PLATELETS: 290 10*3/uL (ref 150–440)
RBC: 4.23 MIL/uL — AB (ref 4.40–5.90)
RDW: 13 % (ref 11.5–14.5)
WBC: 8.6 10*3/uL (ref 3.8–10.6)

## 2017-12-07 LAB — COMPREHENSIVE METABOLIC PANEL
ALT: 32 U/L (ref 0–44)
ANION GAP: 10 (ref 5–15)
AST: 28 U/L (ref 15–41)
Albumin: 4 g/dL (ref 3.5–5.0)
Alkaline Phosphatase: 70 U/L (ref 38–126)
BUN: 9 mg/dL (ref 6–20)
CALCIUM: 9.2 mg/dL (ref 8.9–10.3)
CO2: 24 mmol/L (ref 22–32)
Chloride: 103 mmol/L (ref 98–111)
Creatinine, Ser: 1.02 mg/dL (ref 0.61–1.24)
GFR calc non Af Amer: >60 mL/min (ref >60)
Glucose, Bld: 230 mg/dL — ABNORMAL HIGH (ref 70–99)
Potassium: 3.8 mmol/L (ref 3.5–5.1)
SODIUM: 137 mmol/L (ref 135–145)
Total Bilirubin: 0.4 mg/dL (ref 0.3–1.2)
Total Protein: 7.6 g/dL (ref 6.5–8.1)

## 2017-12-07 LAB — URINE DRUG SCREEN, QUALITATIVE (ARMC ONLY)
Amphetamines, Ur Screen: NOT DETECTED
Benzodiazepine, Ur Scrn: NOT DETECTED
CANNABINOID 50 NG, UR ~~LOC~~: POSITIVE — AB
COCAINE METABOLITE, UR ~~LOC~~: NOT DETECTED
MDMA (ECSTASY) UR SCREEN: NOT DETECTED
Methadone Scn, Ur: NOT DETECTED
OPIATE, UR SCREEN: NOT DETECTED
Phencyclidine (PCP) Ur S: NOT DETECTED
Tricyclic, Ur Screen: NOT DETECTED

## 2017-12-07 LAB — LIPASE, BLOOD: LIPASE: 32 U/L (ref 11–51)

## 2017-12-07 LAB — TROPONIN I: Troponin I: <0.03 ng/mL (ref <0.03)

## 2017-12-07 MED ORDER — HYDROMORPHONE HCL 1 MG/ML IJ SOLN
1.0000 mg | Freq: Once | INTRAMUSCULAR | Status: AC
  Administered 2017-12-07: 1 mg via INTRAVENOUS
  Filled 2017-12-07: qty 1

## 2017-12-07 MED ORDER — MORPHINE SULFATE (PF) 4 MG/ML IV SOLN
4.0000 mg | Freq: Once | INTRAVENOUS | Status: AC
  Administered 2017-12-07: 4 mg via INTRAVENOUS
  Filled 2017-12-07: qty 1

## 2017-12-07 MED ORDER — ONDANSETRON HCL 4 MG/2ML IJ SOLN
4.0000 mg | Freq: Once | INTRAMUSCULAR | Status: AC
  Administered 2017-12-07: 4 mg via INTRAVENOUS
  Filled 2017-12-07: qty 2

## 2017-12-07 MED ORDER — METFORMIN HCL 500 MG PO TABS: 500.0000 mg | ORAL_TABLET | Freq: Two times a day (BID) | ORAL | 1 refills | Status: DC

## 2017-12-07 MED ORDER — IOPAMIDOL (ISOVUE-370) INJECTION 76%
100.0000 mL | Freq: Once | INTRAVENOUS | Status: AC | PRN
  Administered 2017-12-07: 100 mL via INTRAVENOUS

## 2017-12-07 MED ORDER — SODIUM CHLORIDE 0.9 % IV BOLUS
1000.0000 mL | Freq: Once | INTRAVENOUS | Status: AC
  Administered 2017-12-07: 1000 mL via INTRAVENOUS

## 2017-12-07 NOTE — ED Triage Notes (Signed)
Pt is brought in by EMS due to epigastric pain since this morning. States he has hx of MALS and this is a flare up. States pain normally resolves without treatment. Pain is intermittent. Pt is tearful and uncomfortable in triage. Denies N/V/D.

## 2017-12-07 NOTE — Discharge Instructions (Addendum)

## 2017-12-07 NOTE — ED Provider Notes (Signed)
[facility] Emergency Department Provider Note  ____________________________________________  Time seen: Approximately 12:52 PM  I have reviewed the triage vital signs and the nursing notes.   HISTORY  Chief Complaint Abdominal Pain   HPI Kirk Barker is a 42 y.o. male with a history of median arcuate ligament syndrome who presents for evaluation of abdominal pain.  Patient reports that he has pain like this every day for the last 5 years.  The pain usually resolves after few hours with no intervention however today the pain was not resolving.  He has had pain since last night, constant, nonradiating, dull located in his epigastric region.  Pain is now 10/10. No nausea, vomiting, fever, diarrhea, constipation, melena, hematuria, dysuria.  He has had chills.  He reports having similar pain in the past requiring IV narcotics in the emergency room which were due to MALS exacerbation.  Patient had a recent CT scan done a month ago showing no evidence of compression or any other acute findings.  Past Medical History:  Diagnosis Date  . Median arcuate ligament syndrome [facility])     Patient Active Problem List   Diagnosis Date Noted  . Median arcuate ligament syndrome (HCC) 06/26/2017  . Abdominal pain, epigastric 06/26/2017  . Pancreatitis 06/17/2017    Past Surgical History:  Procedure Laterality Date  . CHOLECYSTECTOMY      Prior to Admission medications   Medication Sig Start Date End Date Taking? Authorizing Provider  diltiazem (CARDIZEM CD) 120 MG 24 hr capsule Take 1 capsule (120 mg total) by mouth daily. Patient not taking: Reported on 06/26/2017 06/17/17   Salary, Jetty Duhamel D, MD  metFORMIN (GLUCOPHAGE) 500 MG tablet Take 1 tablet (500 mg total) by mouth 2 (two) times daily with a meal. 12/07/17   Don Perking, [place], MD  metoCLOPramide (REGLAN) 5 MG tablet Take 1 tablet (5 mg total) by mouth 4 (four) times daily -  before meals and at bedtime. Patient not  taking: Reported on 06/26/2017 06/17/17 06/17/18  Salary, Jetty Duhamel D, MD  metoprolol tartrate (LOPRESSOR) 100 MG tablet Take 1 tablet (100 mg total) by mouth 2 (two) times daily. Patient not taking: Reported on 06/26/2017 06/17/17   Salary, Jetty Duhamel D, MD  oxyCODONE-acetaminophen (PERCOCET/ROXICET) 5-325 MG tablet Take 1-2 tablets by mouth every 8 (eight) hours as needed for severe pain. Patient not taking: Reported on 06/25/2017 06/17/17   Salary, Jetty Duhamel D, MD  pantoprazole (PROTONIX) 40 MG tablet Take 1 tablet (40 mg total) by mouth daily. Patient not taking: Reported on 06/26/2017 06/17/17 06/17/18  Salary, Evelena Asa, MD    Allergies Other  Family History  Problem Relation Age of Onset  . Stroke Mother   . Diabetes Father   . Brain cancer Maternal Grandmother     Social History Social History   Tobacco Use  . Smoking status: Former Games developer  . Smokeless tobacco: Never Used  Substance Use Topics  . Alcohol use: No    Frequency: Never  . Drug use: No    Review of Systems  Constitutional: Negative for fever. Eyes: Negative for visual changes. ENT: Negative for sore throat. Neck: No neck pain  Cardiovascular: Negative for chest pain. Respiratory: Negative for shortness of breath. Gastrointestinal: + epigastric abdominal pain. No vomiting or diarrhea. Genitourinary: Negative for dysuria. Musculoskeletal: Negative for back pain. Skin: Negative for rash. Neurological: Negative for headaches, weakness or numbness. Psych: No SI or HI  ____________________________________________   PHYSICAL EXAM:  VITAL SIGNS: ED Triage Vitals  Enc  Vitals Group     BP 12/07/17 1215 (!) 168/96     Pulse Rate 12/07/17 1215 (!) 116     Resp 12/07/17 1215 (!) 22     Temp 12/07/17 1215 98.7 F (37.1 C)     Temp Source 12/07/17 1215 Oral     SpO2 12/07/17 1215 98 %     Weight 12/07/17 1213 235 lb (106.6 kg)     Height 12/07/17 1213 6' (1.829 m)     Head Circumference --      Peak Flow --       Pain Score 12/07/17 1212 10     Pain Loc --      Pain Edu? --      Excl. in GC? --     Constitutional: Alert and oriented, looks uncomfortable, moaning and holding his abdomen.  HEENT:      Head: Normocephalic and atraumatic.         Eyes: Conjunctivae are normal. Sclera is non-icteric.       Mouth/Throat: Mucous membranes are moist.       Neck: Supple with no signs of meningismus. Cardiovascular: Tachycardic with regular rhythm. No murmurs, gallops, or rubs. 2+ symmetrical distal pulses are present in all extremities. No JVD. Respiratory: Normal respiratory effort. Lungs are clear to auscultation bilaterally. No wheezes, crackles, or rhonchi.  Gastrointestinal: Soft, tender to palpation in the epigastric region, and non distended with positive bowel sounds. No rebound or guarding. Genitourinary: No CVA tenderness. Musculoskeletal: Nontender with normal range of motion in all extremities. No edema, cyanosis, or erythema of extremities. Neurologic: Normal speech and language. Face is symmetric. Moving all extremities. No gross focal neurologic deficits are appreciated. Skin: Skin is warm, dry and intact. No rash noted. Psychiatric: Mood and affect are normal. Speech and behavior are normal.  ____________________________________________   LABS (all labs ordered are listed, but only abnormal results are displayed)  Labs Reviewed  COMPREHENSIVE METABOLIC PANEL - Abnormal; Notable for the following components:      Result Value   Glucose, Bld 230 (*)    All other components within normal limits  CBC - Abnormal; Notable for the following components:   RBC 4.23 (*)    HCT 39.8 (*)    All other components within normal limits  URINALYSIS, COMPLETE (UACMP) WITH MICROSCOPIC - Abnormal; Notable for the following components:   Color, Urine STRAW (*)    APPearance CLEAR (*)    Glucose, UA >=500 (*)    All other components within normal limits  URINE DRUG SCREEN, QUALITATIVE ([facility] ONLY) -  Abnormal; Notable for the following components:   Cannabinoid 50 Ng, Ur Dent POSITIVE (*)    Barbiturates, Ur Screen   (*)    Value: Result not available. Reagent lot number recalled by manufacturer.   All other components within normal limits  LIPASE, BLOOD  TROPONIN I   ____________________________________________  EKG  ED ECG REPORT I, Nita Sicklearolina Taliana Mersereau, the attending physician, personally viewed and interpreted this ECG.  NSR, rate of 95, normal intervals, normal axis, no ST elevations or depressions. ____________________________________________  RADIOLOGY  I have personally reviewed the images performed during this visit and I agree with the Radiologist's read.   Interpretation by Radiologist:  Ct Angio Abd/pel W And/or Wo Contrast  Result Date: 12/07/2017 CLINICAL DATA:  42 year old male with a history of abdominal pain EXAM: CTA ABDOMEN AND PELVIS wITHOUT AND WITH CONTRAST TECHNIQUE: Multidetector CT imaging of the abdomen and pelvis was performed using the  standard protocol during bolus administration of intravenous contrast. Multiplanar reconstructed images and MIPs were obtained and reviewed to evaluate the vascular anatomy. CONTRAST:  ISOVUE-370 IOPAMIDOL (ISOVUE-370) INJECTION 76% COMPARISON:  06/17/2017 FINDINGS: VASCULAR Aorta: Unremarkable course, caliber, contour of the abdominal aorta. No dissection, aneurysm, or periaortic fluid. Celiac: No significant atherosclerotic changes at the origin of the celiac artery. Branches are patent. No significant stenosis at the origin. SMA: No significant atherosclerotic changes of the superior mesenteric artery. Renals: Renal arteries are patent. Single left and single right renal artery. IMA: Inferior mesenteric artery is patent. Left colic artery patent. Superior rectal artery patent. Right lower extremity: Unremarkable course, caliber, and contour of the right iliac system. No aneurysm, dissection, or occlusion. Hypogastric artery  is patent. Anterior and posterior division patent. Common femoral artery patent. Proximal SFA and profunda femoris patent. Left lower extremity: Unremarkable course, caliber, and contour of the left iliac system. No aneurysm, dissection, or occlusion. Hypogastric artery is patent. Anterior and posterior division patent. Common femoral artery patent. Proximal SFA and profunda femoris patent. Veins: Unremarkable appearance of the venous system. Review of the MIP images confirms the above findings. NON-VASCULAR Lower chest: No acute. Hepatobiliary: Unremarkable appearance of the liver. Cholecystectomy Pancreas: Unremarkable appearance of the pancreas. No pericholecystic fluid or inflammatory changes. Unremarkable ductal system. Spleen: Unremarkable. Adrenals/Urinary Tract: Unremarkable appearance of adrenal glands. Right: No hydronephrosis. Symmetric perfusion to the left. No nephrolithiasis. Unremarkable course of the right ureter. Left: No hydronephrosis. Symmetric perfusion to the right. No nephrolithiasis. Unremarkable course of the left ureter. Unremarkable appearance of the urinary bladder . Stomach/Bowel: Unremarkable appearance of the stomach. Unremarkable appearance of small bowel. No evidence of obstruction. Minimal colonic diverticula without associated inflammatory changes. Normal appendix. Lymphatic: Multiple lymph nodes in the para-aortic nodal station, none of which are enlarged. Mesenteric: No free fluid or air. No adenopathy. Reproductive: Calcifications of the vas deferens. Transverse diameter of the prostate estimated 5.1 cm Other: No hernia. Musculoskeletal: No evidence of acute fracture. No bony canal narrowing. No significant degenerative changes of the hips. IMPRESSION: No acute CT finding.  No evidence of acute aortic syndrome. CT is negative for signs of mesenteric ischemia. Signed, Yvone Neu. Reyne Dumas, RPVI [facility] Radiology Electronically  Signed   By: Gilmer Mor D.O.   On: 12/07/2017 15:15     ____________________________________________   PROCEDURES  Procedure(s) performed: None Procedures Critical Care performed:  None ____________________________________________   INITIAL IMPRESSION / ASSESSMENT AND PLAN / ED COURSE  42 y.o. male with a history of median arcuate ligament syndrome who presents for evaluation of epigastric abdominal pain which she describes as an exacerbation of his MALS.  Patient looks uncomfortable, holding his abdomen and moaning, he is tachycardic and hypertensive which is consistent with his pain level.  Labs are pending.  Will give morphine and Zofran and IV fluids for pain relief.  Differential diagnosis including MALS flair, PUD, gastritis, pancreatitis, ACS.     _________________________ 5:00 PM on 12/07/2017 -----------------------------------------  Patient requesting several rounds of narcotic pain medication therefore CT angiogram was done to rule out any evidence of ischemia or other intra-abdominal abnormalities.  CT is negative.  Patient's labs concerning for hyperglycemia with no evidence of DKA.  Patient reports that he was told many years ago that he had diabetes but never put on any medications.  I will prescribe metformin and recommended close follow-up with his primary care doctor on Monday for further management of his diabetes.  Also discussed return precautions with patient.  At this time will discharge home.   As part of my medical decision making, I reviewed the following data within the electronic MEDICAL RECORD NUMBER Nursing notes reviewed and incorporated, Labs reviewed , EKG interpreted , Radiograph reviewed , Notes from prior ED visits and [place] Controlled Substance Database    Pertinent labs & imaging results that were available during my care of the patient were reviewed by me and considered in my medical decision making (see chart for  details).    ____________________________________________   FINAL CLINICAL IMPRESSION(S) / ED DIAGNOSES  Final diagnoses:  Epigastric pain      NEW MEDICATIONS STARTED DURING THIS VISIT:  ED Discharge Orders        Ordered    metFORMIN (GLUCOPHAGE) 500 MG tablet  2 times daily with meals     12/07/17 1700       Note:  This document was prepared using Dragon voice recognition software and may include unintentional dictation errors.    Don Perking, [place], MD 12/07/17 [phone]

## 2017-12-10 ENCOUNTER — Other Ambulatory Visit: Payer: Self-pay

## 2017-12-10 ENCOUNTER — Emergency Department
Admission: EM | Admit: 2017-12-10 | Discharge: 2017-12-11 | Disposition: A | Discharge status: Home / Self Care | Payer: BLUE CROSS/BLUE SHIELD | Attending: Emergency Medicine | Admitting: Emergency Medicine

## 2017-12-10 ENCOUNTER — Emergency Department: Payer: BLUE CROSS/BLUE SHIELD

## 2017-12-10 DIAGNOSIS — R1013 Epigastric pain: Secondary | ICD-10-CM

## 2017-12-10 DIAGNOSIS — Z87891 Personal history of nicotine dependence: Secondary | ICD-10-CM | POA: Insufficient documentation

## 2017-12-10 DIAGNOSIS — R109 Unspecified abdominal pain: Secondary | ICD-10-CM | POA: Diagnosis not present

## 2017-12-10 DIAGNOSIS — Z9049 Acquired absence of other specified parts of digestive tract: Secondary | ICD-10-CM | POA: Insufficient documentation

## 2017-12-10 LAB — URINALYSIS, COMPLETE (UACMP) WITH MICROSCOPIC
BACTERIA UA: NONE SEEN
Bilirubin Urine: NEGATIVE
Glucose, UA: >=500 mg/dL — AB
Hgb urine dipstick: NEGATIVE
KETONES UR: NEGATIVE mg/dL
Leukocytes, UA: NEGATIVE
Nitrite: NEGATIVE
PROTEIN: NEGATIVE mg/dL
SQUAMOUS EPITHELIAL / LPF: NONE SEEN (ref 0–5)
Specific Gravity, Urine: 1.018 (ref 1.005–1.030)
pH: 6 (ref 5.0–8.0)

## 2017-12-10 LAB — CBC
HEMATOCRIT: 38.2 % — AB (ref 40.0–52.0)
HEMOGLOBIN: 13.6 g/dL (ref 13.0–18.0)
MCH: 33.4 pg (ref 26.0–34.0)
MCHC: 35.6 g/dL (ref 32.0–36.0)
MCV: 93.6 fL (ref 80.0–100.0)
Platelets: 304 10*3/uL (ref 150–440)
RBC: 4.08 MIL/uL — ABNORMAL LOW (ref 4.40–5.90)
RDW: 13.7 % (ref 11.5–14.5)
WBC: 10.7 10*3/uL — ABNORMAL HIGH (ref 3.8–10.6)

## 2017-12-10 LAB — COMPREHENSIVE METABOLIC PANEL
ALBUMIN: 3.8 g/dL (ref 3.5–5.0)
ALK PHOS: 73 U/L (ref 38–126)
ALT: 42 U/L (ref 0–44)
ANION GAP: 10 (ref 5–15)
AST: 30 U/L (ref 15–41)
BUN: 12 mg/dL (ref 6–20)
CALCIUM: 9.3 mg/dL (ref 8.9–10.3)
CHLORIDE: 100 mmol/L (ref 98–111)
CO2: 27 mmol/L (ref 22–32)
Creatinine, Ser: 1.31 mg/dL — ABNORMAL HIGH (ref 0.61–1.24)
GFR calc Af Amer: >60 mL/min (ref >60)
GFR calc non Af Amer: >60 mL/min (ref >60)
GLUCOSE: 255 mg/dL — AB (ref 70–99)
POTASSIUM: 3.4 mmol/L — AB (ref 3.5–5.1)
SODIUM: 137 mmol/L (ref 135–145)
Total Bilirubin: 0.5 mg/dL (ref 0.3–1.2)
Total Protein: 7.5 g/dL (ref 6.5–8.1)

## 2017-12-10 LAB — LIPASE, BLOOD: Lipase: 27 U/L (ref 11–51)

## 2017-12-10 MED ORDER — HYDROMORPHONE HCL 1 MG/ML IJ SOLN
1.0000 mg | Freq: Once | INTRAMUSCULAR | Status: AC
  Administered 2017-12-10: 1 mg via INTRAVENOUS
  Filled 2017-12-10: qty 1

## 2017-12-10 MED ORDER — KETOROLAC TROMETHAMINE 30 MG/ML IJ SOLN
15.0000 mg | Freq: Once | INTRAMUSCULAR | Status: AC
  Administered 2017-12-10: 15 mg via INTRAVENOUS
  Filled 2017-12-10: qty 1

## 2017-12-10 MED ORDER — ONDANSETRON HCL 4 MG/2ML IJ SOLN
4.0000 mg | Freq: Once | INTRAMUSCULAR | Status: AC
  Administered 2017-12-10: 4 mg via INTRAVENOUS
  Filled 2017-12-10: qty 2

## 2017-12-10 NOTE — ED Triage Notes (Addendum)
Pt arrives to ED via [facility] from home with c/o abdominal pain since this morning. Pt reports pain is localized to the epigastric area. Pt denies c/o N/V/D, no chest pain or SHOB. Pt also reports possibly having a fever but never took actual temp at home. Pt is A&O, in NAD; RR even, regular, and unlabored. EMS reports pt pale and diaphoretic on their arrival, given 50 mcg Fentanyl PIV en route.

## 2017-12-10 NOTE — ED Notes (Signed)
Pt presents to ED for epigastric pain for last 5 years. Pt reports the pain is intermittent and that he has not f/u with GI. Pt states "the only thing that helps with the pain is dilaudid." Pt was last seen 3 days ago in ED for the same complaints. Denies urinary symptoms or CP/SOB. Family remains at bedside.

## 2017-12-10 NOTE — ED Notes (Signed)
Pt called RN into room and reports pain is "so much worse since giving the meds." RN explained that we would not be able to give any more narcotics for at least 15-6430mins. Pt understanding and provider Triplett made aware. Family remains at bedside. Call bell within reach, will continue to monitor.

## 2017-12-10 NOTE — ED Notes (Signed)
Patient has refused Xray x 2 awaiting pain medication. NP aware and at bedside; wants to see results of imaging before ordering additional therapies.

## 2017-12-10 NOTE — ED Notes (Signed)
Pt requests more pain meds, 10/10 pain - notified Triplett PA.

## 2017-12-10 NOTE — ED Provider Notes (Signed)
[facility] Emergency Department Provider Note  ____________________________________________   None    (approximate)  I have reviewed the triage vital signs and the nursing notes.   HISTORY  Chief Complaint Abdominal Pain   HPI Kirk Barker is a 42 y.o. male who presents to the emergency department for treatment and evaluation of abdominal pain.  Patient has chronic abdominal pain for which he has been evaluated here in the emergency department, by gastroenterology, and by vascular specialist.  He had received a diagnosis of median arcuate ligament syndrome.  He was most recently evaluated here on 12/07/2017 and had a CT angio of the abdomen and pelvis which was negative for acute CT findings.  The male visitor in the room reports that he was evaluated [name] [place] [name] who ruled out MALS.  Patient states that the pain acutely worsened this morning.  He has not taken any medications prior to arrival and states that he has just been trying to "deal with it."  Patient also states that when he is in this much pain the only pain medication that will work for him is Dilaudid.  Past Medical History:  Diagnosis Date  . Median arcuate ligament syndrome [name](HCC)     Patient Active Problem List   Diagnosis Date Noted  . Median arcuate ligament syndrome (HCC) 06/26/2017  . Abdominal pain, epigastric 06/26/2017  . Pancreatitis 06/17/2017    Past Surgical History:  Procedure Laterality Date  . CHOLECYSTECTOMY      Prior to Admission medications   Medication Sig Start Date End Date Taking? Authorizing Provider  diltiazem (CARDIZEM CD) 120 MG 24 hr capsule Take 1 capsule (120 mg total) by mouth daily. Patient not taking: Reported on 06/26/2017 06/17/17   Salary, [name], MD  Hyoscyamine Sulfate SL (LEVSIN/SL) 0.125 MG SUBL Place 0.125 mg under the tongue every 6 (six) hours as needed. 12/11/17   [name], [name], FNP  metFORMIN (GLUCOPHAGE) 500 MG tablet Take  1 tablet (500 mg total) by mouth 2 (two) times daily with a meal. 12/07/17   [name], [name], MD  metoCLOPramide (REGLAN) 5 MG tablet Take 1 tablet (5 mg total) by mouth 4 (four) times daily -  before meals and at bedtime. Patient not taking: Reported on 06/26/2017 06/17/17 06/17/18  Salary, [name], MD  metoprolol tartrate (LOPRESSOR) 100 MG tablet Take 1 tablet (100 mg total) by mouth 2 (two) times daily. Patient not taking: Reported on 06/26/2017 06/17/17   Salary, [name], MD  oxyCODONE-acetaminophen (PERCOCET/ROXICET) 5-325 MG tablet Take 1-2 tablets by mouth every 8 (eight) hours as needed for severe pain. Patient not taking: Reported on 06/25/2017 06/17/17   Salary, [name], MD  pantoprazole (PROTONIX) 40 MG tablet Take 1 tablet (40 mg total) by mouth daily. Patient not taking: Reported on 06/26/2017 06/17/17 06/17/18  Salary, [name], MD    Allergies Other  Family History  Problem Relation Age of Onset  . Stroke Mother   . Diabetes Father   . Brain cancer Maternal Grandmother     Social History Social History   Tobacco Use  . Smoking status: Former Games developermoker  . Smokeless tobacco: Never Used  Substance Use Topics  . Alcohol use: No    Frequency: Never  . Drug use: No    Review of Systems  Constitutional: No fever/chills Eyes: No visual changes. ENT: No sore throat. Cardiovascular: Denies chest pain. Respiratory: Denies shortness of breath. Gastrointestinal: Positive for abdominal pain.  No nausea,  no vomiting.  No diarrhea.  No constipation. Genitourinary: Negative for dysuria. Musculoskeletal: Negative for back pain. Skin: Negative for rash. Neurological: Negative for headaches, focal weakness or numbness.  ____________________________________________   PHYSICAL EXAM:  VITAL SIGNS: ED Triage Vitals  Enc Vitals Group     BP 12/10/17 1944 132/81     Pulse Rate 12/10/17 1944 94     Resp 12/10/17 1944 18     Temp 12/10/17 1944 98.9 F (37.2 C)     Temp  Source 12/10/17 1944 Oral     SpO2 12/10/17 1941 97 %     Weight 12/10/17 1945 230 lb (104.3 kg)     Height 12/10/17 1945 6' (1.829 m)     Head Circumference --      Peak Flow --      Pain Score 12/10/17 1945 10     Pain Loc --      Pain Edu? --      Excl. in GC? --     Constitutional: Alert and oriented.  Uncomfortable appearing and in no acute distress. Eyes: Conjunctivae are normal. Head: Atraumatic. Nose: No congestion/rhinnorhea. Mouth/Throat: Mucous membranes are moist. Neck: No stridor.   Cardiovascular: Normal rate, regular rhythm. Grossly normal heart sounds.  Good peripheral circulation. Respiratory: Normal respiratory effort.  No retractions. Lungs CTAB. Gastrointestinal: Soft and diffusely tender, but worse in the epigastric area. No distention. No abdominal bruits. Musculoskeletal: No lower extremity tenderness nor edema.  No joint effusions. Neurologic:  Normal speech and language. No gross focal neurologic deficits are appreciated. No gait instability. Skin:  Skin is warm, dry and intact. No rash noted. Psychiatric: Irritable  ____________________________________________   LABS (all labs ordered are listed, but only abnormal results are displayed)  Labs Reviewed  COMPREHENSIVE METABOLIC PANEL - Abnormal; Notable for the following components:      Result Value   Potassium 3.4 (*)    Glucose, Bld 255 (*)    Creatinine, Ser 1.31 (*)    All other components within normal limits  CBC - Abnormal; Notable for the following components:   WBC 10.7 (*)    RBC 4.08 (*)    HCT 38.2 (*)    All other components within normal limits  URINALYSIS, COMPLETE (UACMP) WITH MICROSCOPIC - Abnormal; Notable for the following components:   Color, Urine YELLOW (*)    APPearance CLEAR (*)    Glucose, UA >=500 (*)    All other components within normal limits  LIPASE, BLOOD   ____________________________________________  EKG  ED ECG REPORT I, Kem Boroughs, the attending  physician, personally viewed and interpreted this ECG.   Date: 12/11/2017  EKG Time: 12:18 AM  Rate: 82  Rhythm: normal EKG, normal sinus rhythm  Axis: No axis deviation  Intervals:none  ST&T Change: No ST elevation.  ____________________________________________  RADIOLOGY  ED MD interpretation: Chest x-ray negative for acute cardiopulmonary abnormality per radiology.  Official radiology report(s): Dg Chest 2 View  Result Date: 12/11/2017 CLINICAL DATA:  42 year old male with epigastric pain. EXAM: CHEST - 2 VIEW COMPARISON:  Chest CT dated 06/17/2017 FINDINGS: The heart size and mediastinal contours are within normal limits. Both lungs are clear. The visualized skeletal structures are unremarkable. IMPRESSION: No active cardiopulmonary disease. Electronically Signed   By: Elgie Collard M.D.   On: 12/11/2017 00:23    ____________________________________________   PROCEDURES  Procedure(s) performed: None  Procedures  Critical Care performed: No  ____________________________________________   INITIAL IMPRESSION / ASSESSMENT AND PLAN / ED COURSE  As part of my medical decision making, I reviewed the following data within the electronic MEDICAL RECORD NUMBER [place] Controlled Substance Database  42 year old male who presents to the emergency department for treatment and evaluation of abdominal pain.  Patient states that this is his typical abdominal pain for which she has been evaluated multiple times and the only relief he gets is when someone gives him "Dilaudid."  Pain is in the same location and the same quality as when he was here before.  He was last evaluated here with a negative CT angio of the abdomen and pelvis.  He states that the pain started abruptly this afternoon.  While here, he has been given 1 mg of Dilaudid and states that it did not provide him relief as usual. He was then given Toradol which he states "never works."   Chest x-ray and EKG are pending.  Patient  states that he wants more Dilaudid.  Controlled substance database reviewed and the patient does not have any active controlled substance prescriptions.  Fentanyl has been ordered.  Patient was discharged home with a prescription for Levsin and strongly advised to follow-up with both his gastroenterologist/surgeon/primary care provider.  ____________________________________________   FINAL CLINICAL IMPRESSION(S) / ED DIAGNOSES  Final diagnoses:  Epigastric pain     ED Discharge Orders        Ordered    Hyoscyamine Sulfate SL (LEVSIN/SL) 0.125 MG SUBL  Every 6 hours PRN     12/11/17 0054       Note:  This document was prepared using Dragon voice recognition software and may include unintentional dictation errors.    Chinita Pester, FNP 12/11/17 0139    Phineas Semen, MD 12/11/17 [phone]

## 2017-12-11 ENCOUNTER — Other Ambulatory Visit: Payer: Self-pay

## 2017-12-11 ENCOUNTER — Emergency Department: Payer: BLUE CROSS/BLUE SHIELD

## 2017-12-11 DIAGNOSIS — R1013 Epigastric pain: Secondary | ICD-10-CM | POA: Diagnosis not present

## 2017-12-11 MED ORDER — FENTANYL CITRATE (PF) 100 MCG/2ML IJ SOLN
100.0000 ug | Freq: Once | INTRAMUSCULAR | Status: AC
  Administered 2017-12-11: 100 ug via INTRAVENOUS
  Filled 2017-12-11: qty 2

## 2017-12-11 MED ORDER — HYOSCYAMINE SULFATE SL 0.125 MG SL SUBL: 0.1250 mg | SUBLINGUAL_TABLET | Freq: Four times a day (QID) | SUBLINGUAL | 0 refills | Status: DC | PRN

## 2017-12-12 DIAGNOSIS — R1013 Epigastric pain: Secondary | ICD-10-CM | POA: Diagnosis not present

## 2017-12-12 DIAGNOSIS — I10 Essential (primary) hypertension: Secondary | ICD-10-CM | POA: Diagnosis not present

## 2017-12-12 DIAGNOSIS — R000 Tachycardia, unspecified: Secondary | ICD-10-CM | POA: Diagnosis not present

## 2018-02-22 ENCOUNTER — Encounter (HOSPITAL_COMMUNITY): Payer: Self-pay

## 2018-02-22 ENCOUNTER — Emergency Department (HOSPITAL_COMMUNITY): Payer: Self-pay

## 2018-02-22 ENCOUNTER — Emergency Department (HOSPITAL_COMMUNITY)
Admission: EM | Admit: 2018-02-22 | Discharge: 2018-02-22 | Disposition: A | Discharge status: Home / Self Care | Payer: Self-pay | Attending: Emergency Medicine | Admitting: Emergency Medicine

## 2018-02-22 DIAGNOSIS — R0789 Other chest pain: Secondary | ICD-10-CM | POA: Diagnosis not present

## 2018-02-22 DIAGNOSIS — R079 Chest pain, unspecified: Secondary | ICD-10-CM | POA: Diagnosis not present

## 2018-02-22 DIAGNOSIS — R0689 Other abnormalities of breathing: Secondary | ICD-10-CM | POA: Diagnosis not present

## 2018-02-22 DIAGNOSIS — Z79899 Other long term (current) drug therapy: Secondary | ICD-10-CM | POA: Insufficient documentation

## 2018-02-22 DIAGNOSIS — R000 Tachycardia, unspecified: Secondary | ICD-10-CM | POA: Diagnosis not present

## 2018-02-22 DIAGNOSIS — Z7984 Long term (current) use of oral hypoglycemic drugs: Secondary | ICD-10-CM | POA: Insufficient documentation

## 2018-02-22 DIAGNOSIS — F17210 Nicotine dependence, cigarettes, uncomplicated: Secondary | ICD-10-CM | POA: Insufficient documentation

## 2018-02-22 LAB — COMPREHENSIVE METABOLIC PANEL
ALBUMIN: 3.9 g/dL (ref 3.5–5.0)
ALK PHOS: 71 U/L (ref 38–126)
ALT: 26 U/L (ref 0–44)
AST: 22 U/L (ref 15–41)
Anion gap: 13 (ref 5–15)
BUN: 6 mg/dL (ref 6–20)
CALCIUM: 9.6 mg/dL (ref 8.9–10.3)
CHLORIDE: 106 mmol/L (ref 98–111)
CO2: 20 mmol/L — AB (ref 22–32)
CREATININE: 1.09 mg/dL (ref 0.61–1.24)
GFR calc non Af Amer: >60 mL/min (ref >60)
GLUCOSE: 151 mg/dL — AB (ref 70–99)
Potassium: 3.8 mmol/L (ref 3.5–5.1)
Sodium: 139 mmol/L (ref 135–145)
Total Bilirubin: 0.7 mg/dL (ref 0.3–1.2)
Total Protein: 7.4 g/dL (ref 6.5–8.1)

## 2018-02-22 LAB — CBC
HCT: 41.4 % (ref 39.0–52.0)
Hemoglobin: 14.1 g/dL (ref 13.0–17.0)
MCH: 31.5 pg (ref 26.0–34.0)
MCHC: 34.1 g/dL (ref 30.0–36.0)
MCV: 92.4 fL (ref 78.0–100.0)
PLATELETS: 275 10*3/uL (ref 150–400)
RBC: 4.48 MIL/uL (ref 4.22–5.81)
RDW: 12.4 % (ref 11.5–15.5)
WBC: 8.6 10*3/uL (ref 4.0–10.5)

## 2018-02-22 LAB — I-STAT TROPONIN, ED: Troponin i, poc: 0 ng/mL (ref 0.00–0.08)

## 2018-02-22 LAB — LIPASE, BLOOD: Lipase: 26 U/L (ref 11–51)

## 2018-02-22 LAB — I-STAT CG4 LACTIC ACID, ED: Lactic Acid, Venous: 1.06 mmol/L (ref 0.5–1.9)

## 2018-02-22 MED ORDER — KETAMINE HCL 50 MG/5ML IJ SOSY
0.1500 mg/kg | PREFILLED_SYRINGE | Freq: Once | INTRAMUSCULAR | Status: AC
  Administered 2018-02-22: 15 mg via INTRAVENOUS
  Filled 2018-02-22: qty 5

## 2018-02-22 MED ORDER — KETOROLAC TROMETHAMINE 30 MG/ML IJ SOLN
15.0000 mg | Freq: Once | INTRAMUSCULAR | Status: AC
  Administered 2018-02-22: 15 mg via INTRAVENOUS
  Filled 2018-02-22: qty 1

## 2018-02-22 MED ORDER — HYDROMORPHONE HCL 1 MG/ML IJ SOLN
1.0000 mg | Freq: Once | INTRAMUSCULAR | Status: AC
  Administered 2018-02-22: 1 mg via INTRAVENOUS
  Filled 2018-02-22: qty 1

## 2018-02-22 MED ORDER — HYDROMORPHONE HCL 1 MG/ML IJ SOLN
0.5000 mg | Freq: Once | INTRAMUSCULAR | Status: AC
  Administered 2018-02-22: 0.5 mg via INTRAVENOUS
  Filled 2018-02-22: qty 1

## 2018-02-22 MED ORDER — OXYCODONE-ACETAMINOPHEN 5-325 MG PO TABS
1.0000 | ORAL_TABLET | Freq: Once | ORAL | Status: DC
  Filled 2018-02-22: qty 1

## 2018-02-22 MED ORDER — ONDANSETRON HCL 4 MG/2ML IJ SOLN
4.0000 mg | Freq: Once | INTRAMUSCULAR | Status: AC
  Administered 2018-02-22: 4 mg via INTRAVENOUS
  Filled 2018-02-22: qty 2

## 2018-02-22 MED ORDER — HALOPERIDOL LACTATE 5 MG/ML IJ SOLN
2.0000 mg | Freq: Once | INTRAMUSCULAR | Status: AC
  Administered 2018-02-22: 2 mg via INTRAVENOUS
  Filled 2018-02-22: qty 1

## 2018-02-22 NOTE — ED Provider Notes (Signed)
[facility] EMERGENCY DEPARTMENT Provider Note   CSN: [id number] Arrival date & time: 02/22/18  1518     History   Chief Complaint Chief Complaint  Patient presents with  . Chest Pain    HPI Kirk Barker is a 42 y.o. male.  The history is provided by the patient and medical records. No language interpreter was used.   Kirk Barker is a 42 y.o. male  with a PMH of MARS, chronic epigastric abdominal pain, pancreatitis who presents to the Emergency Department complaining of worsening epigastric / lower chest pain since last night. Patient reports that he has pain to this area almost daily, but it usually will go away or be very mild. Last night, around 6 pm, his pain drastically increased.  Pain will wax and wane, however her has not way since 6 PM last night.  He has taken no medication prior to arrival for symptoms.  He was given 1 nitro and 324 ASA by EMS prior to arrival.  He does not feel like this helped his symptoms at all. He was given morphine as well which somewhat helped. He reports history of median arcuate ligament syndrome and his exacerbations of this feel similar, although this feels more painful than usual. He denies taking any medications at home on a regular basis. No fever, chills, cough, congestion, shortness of breath, vomiting, syncope, dizziness, weakness, numbness, tingling.   Past Medical History:  Diagnosis Date  . Median arcuate ligament syndrome [facility]     Patient Active Problem List   Diagnosis Date Noted  . Median arcuate ligament syndrome (HCC) 06/26/2017  . Abdominal pain, epigastric 06/26/2017  . Pancreatitis 06/17/2017    Past Surgical History:  Procedure Laterality Date  . CHOLECYSTECTOMY          Home Medications    Prior to Admission medications   Medication Sig Start Date End Date Taking? Authorizing Provider  diltiazem (CARDIZEM CD) 120 MG 24 hr capsule Take 1 capsule (120 mg total) by mouth daily. Patient not  taking: Reported on 06/26/2017 06/17/17   Salary, Evelena Asa, MD  Hyoscyamine Sulfate SL (LEVSIN/SL) 0.125 MG SUBL Place 0.125 mg under the tongue every 6 (six) hours as needed. 12/11/17   Triplett, Rulon Eisenmenger B, FNP  metFORMIN (GLUCOPHAGE) 500 MG tablet Take 1 tablet (500 mg total) by mouth 2 (two) times daily with a meal. 12/07/17   Don Perking, [place], MD  metoCLOPramide (REGLAN) 5 MG tablet Take 1 tablet (5 mg total) by mouth 4 (four) times daily -  before meals and at bedtime. Patient not taking: Reported on 06/26/2017 06/17/17 06/17/18  Salary, Jetty Duhamel D, MD  metoprolol tartrate (LOPRESSOR) 100 MG tablet Take 1 tablet (100 mg total) by mouth 2 (two) times daily. Patient not taking: Reported on 06/26/2017 06/17/17   Salary, Jetty Duhamel D, MD  oxyCODONE-acetaminophen (PERCOCET/ROXICET) 5-325 MG tablet Take 1-2 tablets by mouth every 8 (eight) hours as needed for severe pain. Patient not taking: Reported on 06/25/2017 06/17/17   Salary, Jetty Duhamel D, MD  pantoprazole (PROTONIX) 40 MG tablet Take 1 tablet (40 mg total) by mouth daily. Patient not taking: Reported on 06/26/2017 06/17/17 06/17/18  Salary, Evelena Asa, MD    Family History Family History  Problem Relation Age of Onset  . Stroke Mother   . Diabetes Father   . Brain cancer Maternal Grandmother     Social History Social History   Tobacco Use  . Smoking status: Current Every Day Smoker    Types: Cigarettes  .  Smokeless tobacco: Never Used  Substance Use Topics  . Alcohol use: No    Frequency: Never  . Drug use: No     Allergies   Other   Review of Systems Review of Systems  Cardiovascular: Positive for chest pain. Negative for palpitations and leg swelling.  Gastrointestinal: Positive for abdominal pain and nausea. Negative for blood in stool, constipation, diarrhea and vomiting.  All other systems reviewed and are negative.    Physical Exam Updated Vital Signs BP (!) 174/68   Pulse 89   Temp 98.7 F (37.1 C) (Oral)   Resp 18   Ht  6' (1.829 m)   Wt 99.8 kg   SpO2 100%   BMI 29.84 kg/m   Physical Exam  Constitutional: He is oriented to person, place, and time. He appears well-developed and well-nourished.  Appears uncomfortable.  HENT:  Head: Normocephalic and atraumatic.  Cardiovascular: Normal heart sounds.  No murmur heard. Intact and equal pulses x4. Tachycardic, but regular.  Pulmonary/Chest: Effort normal and breath sounds normal. No respiratory distress.  Abdominal: Soft. Bowel sounds are normal. He exhibits no distension.  No abdominal tenderness.  Musculoskeletal: Normal range of motion.  Neurological: He is alert and oriented to person, place, and time.  Skin: Skin is warm and dry.  Nursing note and vitals reviewed.    ED Treatments / Results  Labs (all labs ordered are listed, but only abnormal results are displayed) Labs Reviewed  COMPREHENSIVE METABOLIC PANEL - Abnormal; Notable for the following components:      Result Value   CO2 20 (*)    Glucose, Bld 151 (*)    All other components within normal limits  CBC  LIPASE, BLOOD  I-STAT TROPONIN, ED  I-STAT CG4 LACTIC ACID, ED    EKG EKG Interpretation  Date/Time:  Friday February 22 2018 15:23:20 EDT Ventricular Rate:  103 PR Interval:    QRS Duration: 91 QT Interval:  344 QTC Calculation: 451 R Axis:   70 Text Interpretation:  Sinus tachycardia Confirmed by Benjiman Core [phone]) on 02/22/2018 4:27:42 PM   Radiology Dg Chest 2 View  Result Date: 02/22/2018 CLINICAL DATA:  Chest pain with tachycardia EXAM: CHEST - 2 VIEW COMPARISON:  December 10, 2017 FINDINGS: The lungs are clear. The heart size and pulmonary vascularity are normal. No adenopathy. No pneumothorax. No blastic or lytic bone lesions. No pneumothorax. IMPRESSION: No edema or consolidation. Electronically Signed   By: Bretta Bang III M.D.   On: 02/22/2018 16:00    Procedures Procedures (including critical care time)  Medications Ordered in  ED Medications  HYDROmorphone (DILAUDID) injection 1 mg (1 mg Intravenous Given 02/22/18 1629)  ondansetron (ZOFRAN) injection 4 mg (4 mg Intravenous Given 02/22/18 1626)  HYDROmorphone (DILAUDID) injection 0.5 mg (0.5 mg Intravenous Given 02/22/18 1814)  haloperidol lactate (HALDOL) injection 2 mg (2 mg Intravenous Given 02/22/18 1900)  ketorolac (TORADOL) 30 MG/ML injection 15 mg (15 mg Intravenous Given 02/22/18 1850)     Initial Impression / Assessment and Plan / ED Course  I have reviewed the triage vital signs and the nursing notes.  Pertinent labs & imaging results that were available during my care of the patient were reviewed by me and considered in my medical decision making (see chart for details).    Kirk Barker is a 42 y.o. male who presents to ED for lower chest / upper abdominal pain which began last night at [facility]. Constant, but waxing-waning, since that time. Hx of similar with  multiple negative ER work-ups per chart reviewed. Followed by several specialists. Chart reviewed extensively.  8 negative CT scans during similar pain episodes.  3:43 PM - Patient evaluated. Notifed attending, Dr. Rubin Payor, that patient appeared in a significant amount of pain and tachycardic. We discussed plan of care. He has had multiple CTA's for similar complaint. Dr. Rubin Payor recommends holding on advanced imaging at this time. Will give pain meds, get labs including troponin, CXR and EKG then reassess.   Patient resting on re-evaluation following medication administration. Pain improved. Labs reviewed and reassuring. Normal troponin. EKG without acute ischemic changes.  Went to discuss follow up and return precautions with patient, however he reports that his pain is back. He still states this is similar to his previous episodes. Will provide further pain control.   We discussed strict return precautions at length. Follow up with his plethora of specialists encouraged. All questions answered.    Patient seen by and discussed with Dr. Rubin Payor who agrees with treatment plan.   Final Clinical Impressions(s) / ED Diagnoses   Final diagnoses:  Atypical chest pain    ED Discharge Orders    None       Ward, Chase Picket, PA-C 02/22/18 2309    Benjiman Core, MD 02/23/18 [phone]

## 2018-02-22 NOTE — ED Triage Notes (Signed)
Pt from home via ems; pt c/o cp since 6 pm last night; pt describes as toward center of chest, comes and goes; 10/10 initially; tachycardic and hypertensive; per pt, pt has condition in which ligament near aorta  spasms (mals); 12 lead unremarkable w/ ems;  Temporary relief w/ meds given PTA (5/10)  PTA 324 asa 1 nitro 10 mg morphine total  170/118 P 102 RR 24 98% RA

## 2018-02-22 NOTE — Discharge Instructions (Addendum)
It was my pleasure taking care of you today!   Keep your follow up appointments with your specialists.   Return to ER for new or worsening symptoms, any additional concerns.

## 2018-02-22 NOTE — ED Notes (Signed)
Called lab to inquire about pt's CMP and Lipase results; per lab, labs a currently running

## 2018-03-03 ENCOUNTER — Emergency Department
Admission: EM | Admit: 2018-03-03 | Discharge: 2018-03-03 | Disposition: A | Discharge status: Home / Self Care | Payer: Medicaid Other | Attending: Emergency Medicine | Admitting: Emergency Medicine

## 2018-03-03 DIAGNOSIS — F17210 Nicotine dependence, cigarettes, uncomplicated: Secondary | ICD-10-CM | POA: Diagnosis not present

## 2018-03-03 DIAGNOSIS — Y929 Unspecified place or not applicable: Secondary | ICD-10-CM | POA: Insufficient documentation

## 2018-03-03 DIAGNOSIS — Y999 Unspecified external cause status: Secondary | ICD-10-CM | POA: Diagnosis not present

## 2018-03-03 DIAGNOSIS — Y939 Activity, unspecified: Secondary | ICD-10-CM | POA: Diagnosis not present

## 2018-03-03 DIAGNOSIS — W57XXXA Bitten or stung by nonvenomous insect and other nonvenomous arthropods, initial encounter: Secondary | ICD-10-CM | POA: Insufficient documentation

## 2018-03-03 DIAGNOSIS — T63441A Toxic effect of venom of bees, accidental (unintentional), initial encounter: Secondary | ICD-10-CM | POA: Diagnosis not present

## 2018-03-03 DIAGNOSIS — T63444A Toxic effect of venom of bees, undetermined, initial encounter: Secondary | ICD-10-CM

## 2018-03-03 DIAGNOSIS — S60561A Insect bite (nonvenomous) of right hand, initial encounter: Secondary | ICD-10-CM | POA: Diagnosis not present

## 2018-03-03 DIAGNOSIS — Z79899 Other long term (current) drug therapy: Secondary | ICD-10-CM | POA: Diagnosis not present

## 2018-03-03 MED ORDER — DIPHENHYDRAMINE HCL 50 MG/ML IJ SOLN
50.0000 mg | Freq: Once | INTRAMUSCULAR | Status: AC
  Administered 2018-03-03: 50 mg via INTRAVENOUS

## 2018-03-03 MED ORDER — FAMOTIDINE IN NACL 20-0.9 MG/50ML-% IV SOLN
20.0000 mg | Freq: Once | INTRAVENOUS | Status: AC
  Administered 2018-03-03: 20 mg via INTRAVENOUS

## 2018-03-03 MED ORDER — GABAPENTIN 600 MG PO TABS
300.0000 mg | ORAL_TABLET | Freq: Once | ORAL | Status: AC
  Administered 2018-03-03: 300 mg via ORAL
  Filled 2018-03-03: qty 1

## 2018-03-03 MED ORDER — DIPHENHYDRAMINE HCL 50 MG/ML IJ SOLN: 25.0000 mg | Freq: Once | INTRAMUSCULAR | Status: DC

## 2018-03-03 MED ORDER — METHYLPREDNISOLONE SODIUM SUCC 125 MG IJ SOLR
INTRAMUSCULAR | Status: AC
  Filled 2018-03-03: qty 2

## 2018-03-03 MED ORDER — DIPHENHYDRAMINE HCL 50 MG/ML IJ SOLN
INTRAMUSCULAR | Status: AC
  Filled 2018-03-03: qty 1

## 2018-03-03 MED ORDER — LIDOCAINE-PRILOCAINE 2.5-2.5 % EX CREA
TOPICAL_CREAM | Freq: Once | CUTANEOUS | Status: AC
  Administered 2018-03-03: 13:00:00 via TOPICAL
  Filled 2018-03-03: qty 5

## 2018-03-03 MED ORDER — PREDNISONE 10 MG (21) PO TBPK: ORAL_TABLET | ORAL | 0 refills | Status: DC

## 2018-03-03 MED ORDER — ACETAMINOPHEN 500 MG PO TABS
1000.0000 mg | ORAL_TABLET | Freq: Once | ORAL | Status: AC
  Administered 2018-03-03: 1000 mg via ORAL
  Filled 2018-03-03: qty 2

## 2018-03-03 MED ORDER — FAMOTIDINE IN NACL 20-0.9 MG/50ML-% IV SOLN
INTRAVENOUS | Status: AC
  Filled 2018-03-03: qty 50

## 2018-03-03 MED ORDER — METHYLPREDNISOLONE SODIUM SUCC 125 MG IJ SOLR
125.0000 mg | Freq: Once | INTRAMUSCULAR | Status: AC
  Administered 2018-03-03: 125 mg via INTRAVENOUS

## 2018-03-03 MED ORDER — SODIUM CHLORIDE 0.9 % IV BOLUS
1000.0000 mL | Freq: Once | INTRAVENOUS | Status: AC
  Administered 2018-03-03: 1000 mL via INTRAVENOUS

## 2018-03-03 MED ORDER — KETOROLAC TROMETHAMINE 30 MG/ML IJ SOLN
30.0000 mg | Freq: Once | INTRAMUSCULAR | Status: AC
  Administered 2018-03-03: 30 mg via INTRAVENOUS
  Filled 2018-03-03: qty 1

## 2018-03-03 NOTE — ED Notes (Signed)
Pt friend arrived to room and was agitated with nursing staff without reason - requesting nurses name

## 2018-03-03 NOTE — Discharge Instructions (Signed)
Please seek medical attention for any high fevers, chest pain, shortness of breath, change in behavior, persistent vomiting, bloody stool or any other new or concerning symptoms.  

## 2018-03-03 NOTE — ED Notes (Signed)
Pt was up standing at door. MD cleared pt to eat and drink, RN gave ice water, graham crackers, peanut butter, and saltine crackers. Pt complains of right hand pain on the pinky finger, no swelling or obvious injury noted at this time.

## 2018-03-03 NOTE — ED Notes (Signed)
Pt ate crackers and peanut butter and requested blanket, blanket given.

## 2018-03-03 NOTE — ED Notes (Signed)
Pt friend will not allow the door to be closed and remains agitated about care  Pt is in NAD - respirations are even and unlabored - no rash/hives/whelps noted - no redness or swelling noted to right hand Pt refuses to relax arm so that accurate BP can be taken - will attempt at another time

## 2018-03-03 NOTE — ED Triage Notes (Signed)
Pt presents c/o bee sting x2 to right hand with known allergy. Reports has not had sting since was a child. Pt tearful in triage.

## 2018-03-03 NOTE — ED Notes (Signed)
Pt friend raising voice at nurse because no one had been in room to check on pt and he was in severe pain - attempted to explain to pt that I was with another pt - she demanded that the nurse speak with the doctor and stood in the doorway while Dr Derrill Kay was notified - pt friend then came to nurses station and demanded that secretary give her the name and number for pt advocate - no advocate here today so charge nurse notified - pt friend notified that charge nurse was notified and would come to speak with her

## 2018-03-03 NOTE — ED Provider Notes (Signed)
[facility] Emergency Department Provider Note   ____________________________________________   I have reviewed the triage vital signs and the nursing notes.   HISTORY  Chief Complaint Insect Bite   History limited by: Not Limited   HPI Kirk Barker is a 42 y.o. male who presents to the emergency department today because of concerns for a wasp sting to her right hand.  He states that the sting happened roughly 30 minutes prior to evaluation.  He states that the pain is severe in his fingers.  Primarily the third and fourth fingers of his right hand.  Patient states that he has had an allergic reaction to bee sting in the past however he was much younger.  He denies any shortness of breath.  No vomiting.  No rashes.   Per medical record review patient has a history of pancreatitis  Past Medical History:  Diagnosis Date  . Median arcuate ligament syndrome [facility])     Patient Active Problem List   Diagnosis Date Noted  . Median arcuate ligament syndrome (HCC) 06/26/2017  . Abdominal pain, epigastric 06/26/2017  . Pancreatitis 06/17/2017    Past Surgical History:  Procedure Laterality Date  . CHOLECYSTECTOMY      Prior to Admission medications   Medication Sig Start Date End Date Taking? Authorizing Provider  diltiazem (CARDIZEM CD) 120 MG 24 hr capsule Take 1 capsule (120 mg total) by mouth daily. Patient not taking: Reported on 06/26/2017 06/17/17   Salary, Evelena Asa, MD  Hyoscyamine Sulfate SL (LEVSIN/SL) 0.125 MG SUBL Place 0.125 mg under the tongue every 6 (six) hours as needed. 12/11/17   Triplett, Rulon Eisenmenger B, FNP  metFORMIN (GLUCOPHAGE) 500 MG tablet Take 1 tablet (500 mg total) by mouth 2 (two) times daily with a meal. 12/07/17   Don Perking, [place], MD  metoCLOPramide (REGLAN) 5 MG tablet Take 1 tablet (5 mg total) by mouth 4 (four) times daily -  before meals and at bedtime. Patient not taking: Reported on 06/26/2017 06/17/17 06/17/18  Salary, Jetty Duhamel D,  MD  metoprolol tartrate (LOPRESSOR) 100 MG tablet Take 1 tablet (100 mg total) by mouth 2 (two) times daily. Patient not taking: Reported on 06/26/2017 06/17/17   Salary, Jetty Duhamel D, MD  oxyCODONE-acetaminophen (PERCOCET/ROXICET) 5-325 MG tablet Take 1-2 tablets by mouth every 8 (eight) hours as needed for severe pain. Patient not taking: Reported on 06/25/2017 06/17/17   Salary, Jetty Duhamel D, MD  pantoprazole (PROTONIX) 40 MG tablet Take 1 tablet (40 mg total) by mouth daily. Patient not taking: Reported on 06/26/2017 06/17/17 06/17/18  Salary, Evelena Asa, MD    Allergies Bee venom and Other  Family History  Problem Relation Age of Onset  . Stroke Mother   . Diabetes Father   . Brain cancer Maternal Grandmother     Social History Social History   Tobacco Use  . Smoking status: Current Every Day Smoker    Types: Cigarettes  . Smokeless tobacco: Never Used  Substance Use Topics  . Alcohol use: No    Frequency: Never  . Drug use: No    Review of Systems Constitutional: No fever/chills Eyes: No visual changes. ENT: No sore throat. Cardiovascular: Denies chest pain. Respiratory: Denies shortness of breath. Gastrointestinal: No abdominal pain.  No nausea, no vomiting.  No diarrhea.   Genitourinary: Negative for dysuria. Musculoskeletal: Positive for right hand pain. Skin: Negative for rash. Neurological: Negative for headaches, focal weakness or numbness.  ____________________________________________   PHYSICAL EXAM:  VITAL SIGNS: ED Triage Vitals  Enc Vitals Group     BP --      Pulse Rate 03/03/18 1227 (!) 142     Resp 03/03/18 1227 18     Temp 03/03/18 1227 99.7 F (37.6 C)     Temp Source 03/03/18 1227 Oral     SpO2 03/03/18 1227 98 %     Weight 03/03/18 1232 230 lb (104.3 kg)     Height --      Head Circumference --      Peak Flow --      Pain Score 03/03/18 1230 10   Constitutional: Alert and oriented.  Eyes: Conjunctivae are normal.  ENT      Head:  Normocephalic and atraumatic.      Nose: No congestion/rhinnorhea.      Mouth/Throat: Mucous membranes are moist.      Neck: No stridor. Hematological/Lymphatic/Immunilogical: No cervical lymphadenopathy. Cardiovascular: Normal rate, regular rhythm.  No murmurs, rubs, or gallops. Respiratory: Normal respiratory effort without tachypnea nor retractions. Breath sounds are clear and equal bilaterally. No wheezes/rales/rhonchi. Gastrointestinal: Soft and non tender. No rebound. No guarding.  Genitourinary: Deferred Musculoskeletal: Normal range of motion in all extremities. No lower extremity edema. Neurologic:  Normal speech and language. No gross focal neurologic deficits are appreciated.  Skin:  Skin is warm, dry and intact. No rash noted. Psychiatric: Mood and affect are normal. Speech and behavior are normal. Patient exhibits appropriate insight and judgment.  ____________________________________________    LABS (pertinent positives/negatives)  None  ____________________________________________   EKG  None  ____________________________________________    RADIOLOGY  None  ____________________________________________   PROCEDURES  Procedures  ____________________________________________   INITIAL IMPRESSION / ASSESSMENT AND PLAN / ED COURSE  Pertinent labs & imaging results that were available during my care of the patient were reviewed by me and considered in my medical decision making (see chart for details).   Presented to the emergency department today because of concerns for bee stings to his right hand.  On exam there is no obvious swelling.  No erythema.  Patient was watched in the emergency department for a couple of hours without any worsening of the patient's hand in terms of swelling or erythema.  Patient however is complaining of pain to that area.  He was tried on multiple different pain medications.  At this point doubt true allergic reaction however will  give patient prescription for prednisone out of abundance of caution.  ____________________________________________   FINAL CLINICAL IMPRESSION(S) / ED DIAGNOSES  Final diagnoses:  Bee sting, undetermined intent, initial encounter     Note: This dictation was prepared with Dragon dictation. Any transcriptional errors that result from this process are unintentional     Phineas Semen, MD 03/03/18 1523

## 2018-03-03 NOTE — ED Notes (Signed)
PT c/o of pain radiating to elbow now. Pt yelling and rolling in bed. EMLA cream and toradol evidently providing no relief. Dr. Derrill Kay notified of increasing/radiation pain.

## 2018-03-03 NOTE — ED Notes (Signed)
Pt c/o pain radiating further down his hand. Pt able to hold hand still and speak and point out areas of pain. EDP made aware

## 2018-03-03 NOTE — ED Notes (Signed)
EDP made aware of pt's increasing pain

## 2018-03-03 NOTE — ED Notes (Signed)
VO per Dr Derrill Kay for EMLA cream to pt right fingers where he was bee stung

## 2019-06-06 IMAGING — CT CT ANGIO CHEST-ABD-PELV FOR DISSECTION W/ AND WO/W CM
2 of 7 series · 14 of 46 positions shown, 16 images · IV contrast (APPLIED)
Comparison: None

CLINICAL DATA: Chest pain.

EXAM:
CT ANGIOGRAPHY CHEST, ABDOMEN AND PELVIS
TECHNIQUE: Multidetector CT imaging through the chest, abdomen and pelvis was
performed using the standard protocol during bolus administration of
intravenous contrast. Multiplanar reconstructed images and MIPs were
obtained and reviewed to evaluate the vascular anatomy.
CONTRAST:  100mL L1WWKD-UGW IOPAMIDOL (L1WWKD-UGW) INJECTION 76%

[Series 5: axial arterial · axial · arterial · 0.80mm/px · z∈[-1037,-449]mm · 11 of 224 slices shown, 13 images]
[im 14/224  soft-tissue]
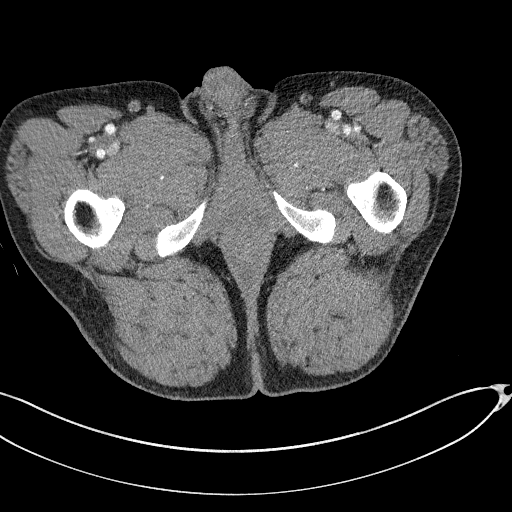
[im 14/224  bone]
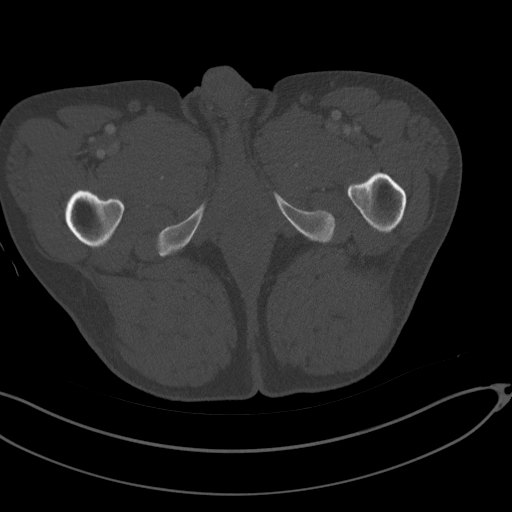
[im 42/224  soft-tissue]
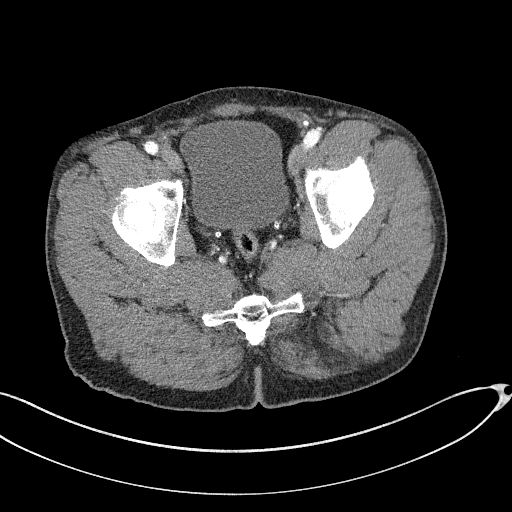
[im 56/224  soft-tissue]
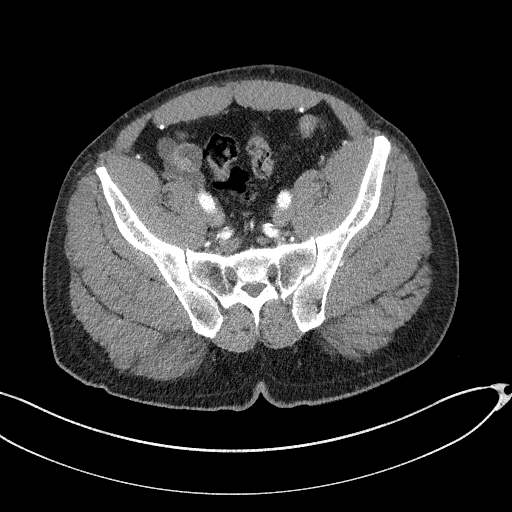
[im 70/224  soft-tissue]
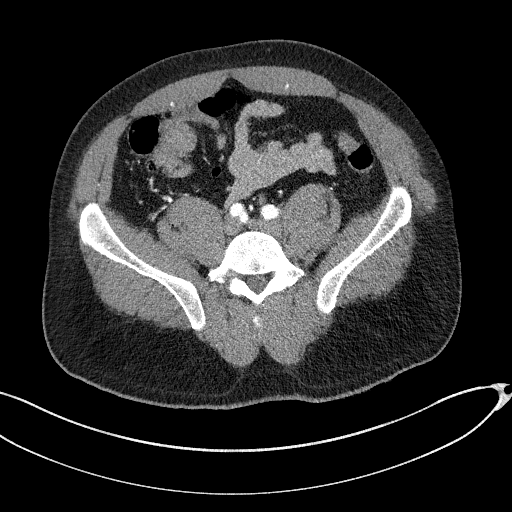
[im 98/224  soft-tissue]
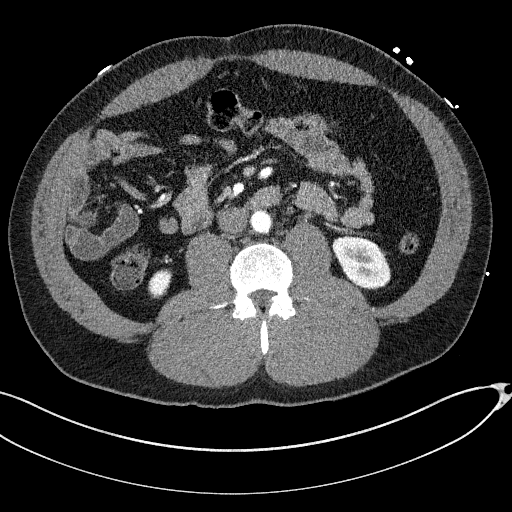
[im 112/224  soft-tissue]
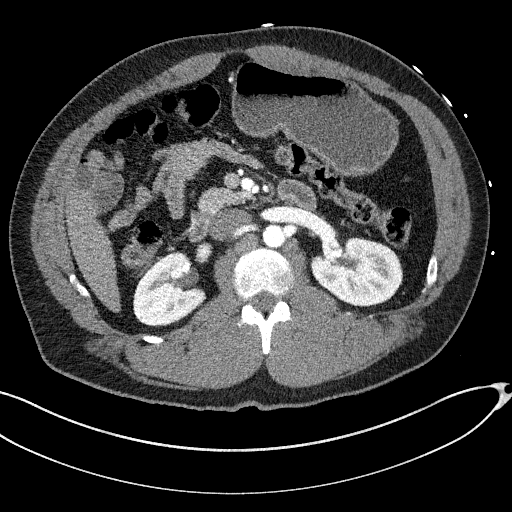
[im 126/224  soft-tissue]
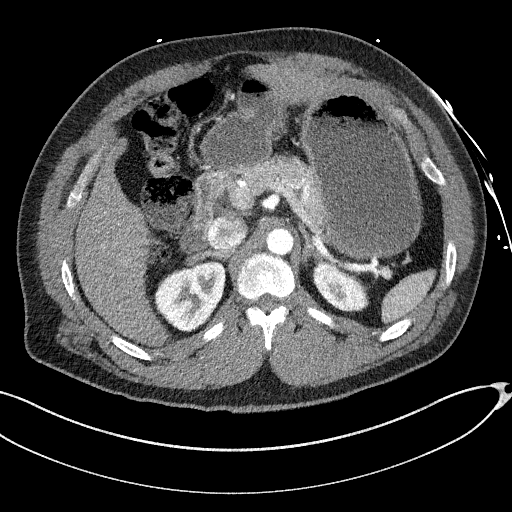
[im 154/224  soft-tissue]
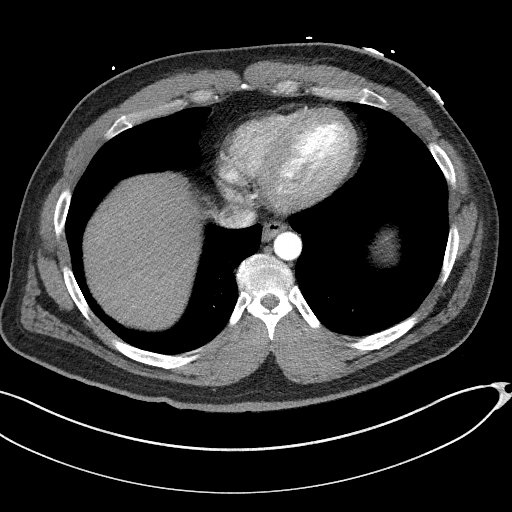
[im 168/224  soft-tissue]
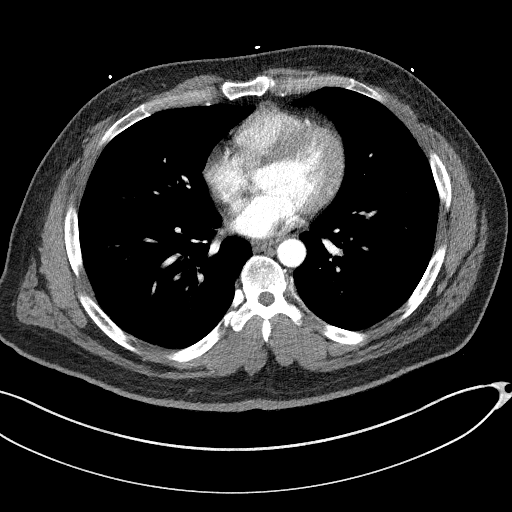
[im 168/224  bone]
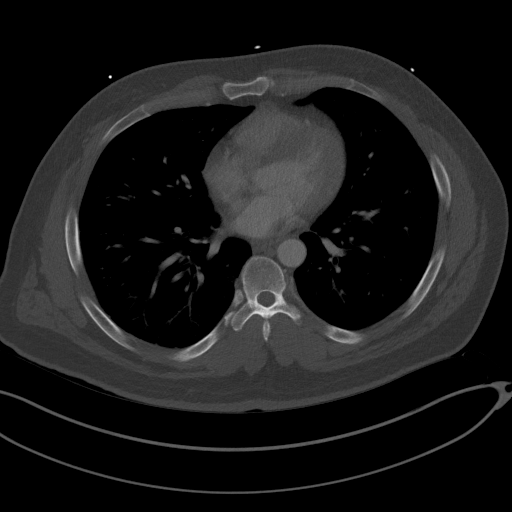
[im 182/224  soft-tissue]
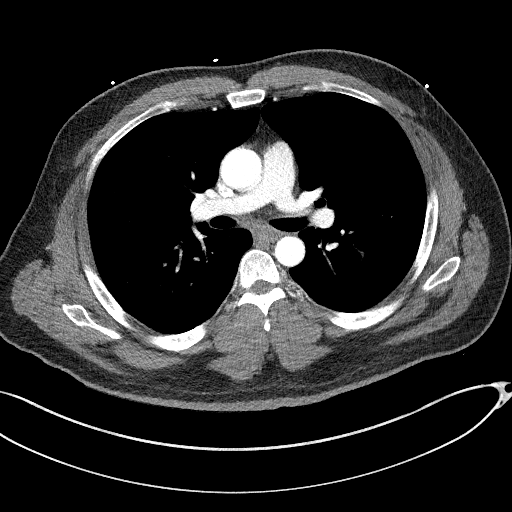
[im 210/224  soft-tissue]
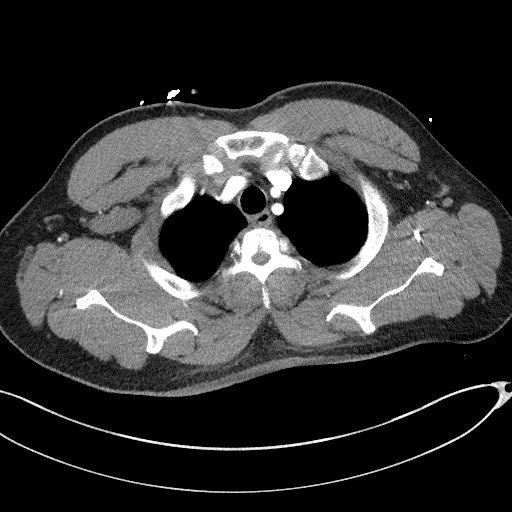

[Series 7: coronals · coronal · 0.77mm/px · 3 of 154 slices shown]
[im 39/154  soft-tissue]
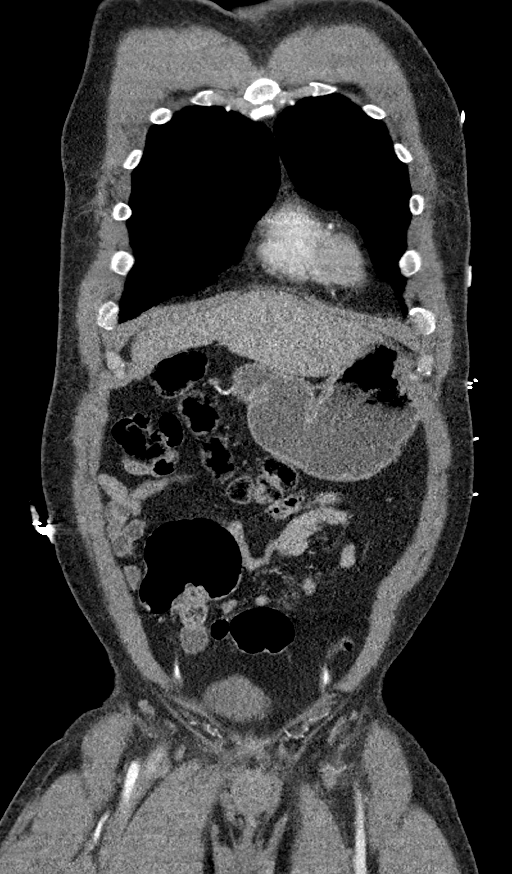
[im 77/154  soft-tissue]
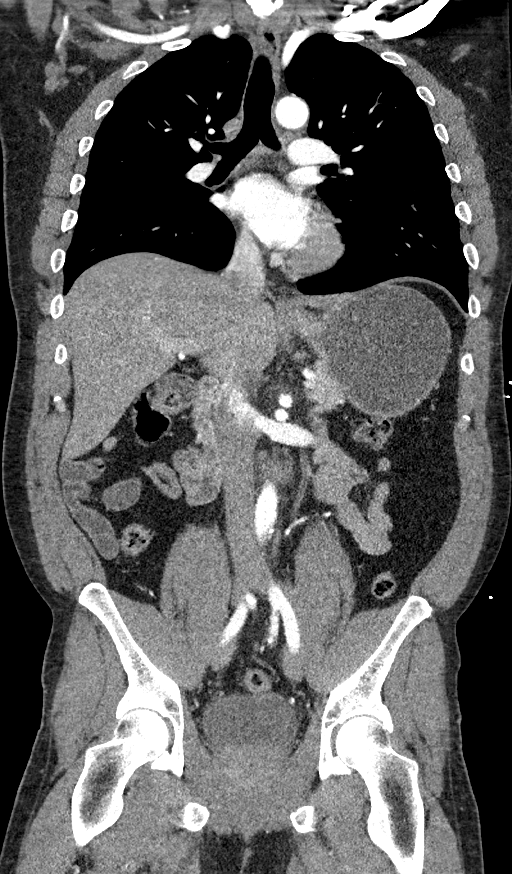
[im 115/154  soft-tissue]
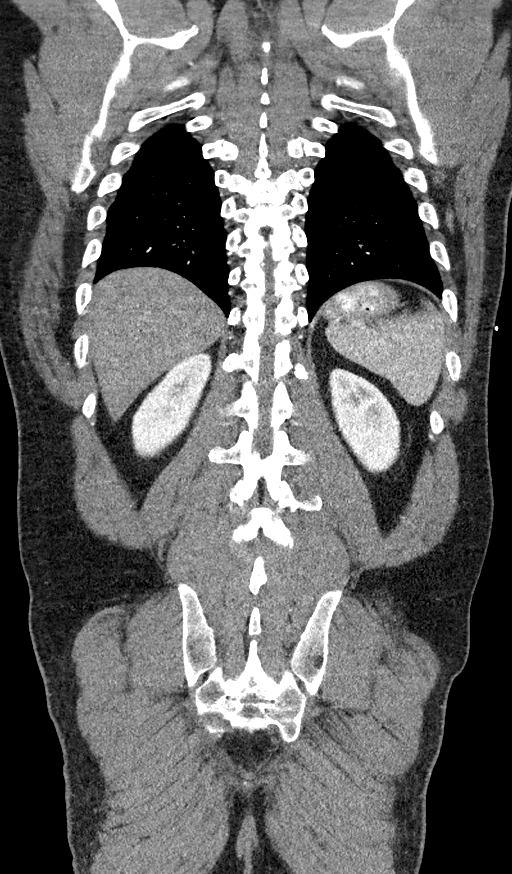

[14 of 46 positions shown; findings below may reference images not displayed]

FINDINGS: CTA CHEST FINDINGS

Cardiovascular: Preferential opacification of the thoracic aorta. No
evidence of thoracic aortic aneurysm or dissection. Normal heart
size. No pericardial effusion.

Mediastinum/Nodes: No enlarged mediastinal, hilar, or axillary lymph
nodes. Thyroid gland, trachea, and esophagus demonstrate no
significant findings.

Lungs/Pleura: No pleural effusion. No airspace consolidation or
atelectasis.

Musculoskeletal: No chest wall abnormality. No acute or significant
osseous findings.

Review of the MIP images confirms the above findings.

CTA ABDOMEN AND PELVIS FINDINGS

VASCULAR

Aorta: Normal caliber aorta without aneurysm, dissection, vasculitis
or significant stenosis.

Celiac: Patent without evidence of aneurysm, dissection, vasculitis
or significant stenosis.

SMA: Patent without evidence of aneurysm, dissection, vasculitis or
significant stenosis.

Renals: Both renal arteries are patent without evidence of aneurysm,
dissection, vasculitis, fibromuscular dysplasia or significant
stenosis.

IMA: Patent without evidence of aneurysm, dissection, vasculitis or
significant stenosis.

Inflow: Patent without evidence of aneurysm, dissection, vasculitis
or significant stenosis.

Veins: No obvious venous abnormality within the limitations of this
arterial phase study.

Review of the MIP images confirms the above findings.

NON-VASCULAR

Hepatobiliary: No focal liver abnormality is seen. Status post
cholecystectomy. No biliary dilatation.

Pancreas: Unremarkable. No pancreatic ductal dilatation or
surrounding inflammatory changes.

Spleen: Normal in size without focal abnormality.

Adrenals/Urinary Tract: Adrenal glands are unremarkable. Kidneys are
normal, without renal calculi, focal lesion, or hydronephrosis.
Bladder is unremarkable.

Stomach/Bowel: Stomach is within normal limits. Appendix appears
normal. No evidence of bowel wall thickening, distention, or
inflammatory changes.

Lymphatic: No significant vascular findings are present. No enlarged
abdominal or pelvic lymph nodes.

Reproductive: Prostate is unremarkable.

Other: No abdominal wall hernia or abnormality. No abdominopelvic
ascites.

Musculoskeletal: No acute or significant osseous findings.

Review of the MIP images confirms the above findings.
IMPRESSION: 1. No acute findings identified.  No evidence for aortic dissection.
2. Previous cholecystectomy.

## 2019-09-13 ENCOUNTER — Other Ambulatory Visit: Payer: Self-pay

## 2019-09-13 ENCOUNTER — Encounter (HOSPITAL_COMMUNITY): Payer: Self-pay

## 2019-09-13 ENCOUNTER — Emergency Department (HOSPITAL_COMMUNITY): Payer: Medicaid Other

## 2019-09-13 ENCOUNTER — Inpatient Hospital Stay (HOSPITAL_COMMUNITY)
Admission: EM | Admit: 2019-09-13 | Discharge: 2019-09-16 | DRG: 247 | Disposition: A | Discharge status: Home / Self Care | Payer: Medicaid Other | Attending: Cardiology | Admitting: Cardiology

## 2019-09-13 ENCOUNTER — Inpatient Hospital Stay (HOSPITAL_COMMUNITY): Payer: Medicaid Other

## 2019-09-13 DIAGNOSIS — I2510 Atherosclerotic heart disease of native coronary artery without angina pectoris: Secondary | ICD-10-CM | POA: Diagnosis not present

## 2019-09-13 DIAGNOSIS — F17210 Nicotine dependence, cigarettes, uncomplicated: Secondary | ICD-10-CM | POA: Diagnosis not present

## 2019-09-13 DIAGNOSIS — E1165 Type 2 diabetes mellitus with hyperglycemia: Secondary | ICD-10-CM | POA: Diagnosis not present

## 2019-09-13 DIAGNOSIS — R079 Chest pain, unspecified: Secondary | ICD-10-CM

## 2019-09-13 DIAGNOSIS — Z720 Tobacco use: Secondary | ICD-10-CM | POA: Diagnosis not present

## 2019-09-13 DIAGNOSIS — R072 Precordial pain: Secondary | ICD-10-CM | POA: Diagnosis not present

## 2019-09-13 DIAGNOSIS — G4440 Drug-induced headache, not elsewhere classified, not intractable: Secondary | ICD-10-CM | POA: Diagnosis not present

## 2019-09-13 DIAGNOSIS — E119 Type 2 diabetes mellitus without complications: Secondary | ICD-10-CM

## 2019-09-13 DIAGNOSIS — E7800 Pure hypercholesterolemia, unspecified: Secondary | ICD-10-CM | POA: Diagnosis present

## 2019-09-13 DIAGNOSIS — R739 Hyperglycemia, unspecified: Secondary | ICD-10-CM | POA: Diagnosis not present

## 2019-09-13 DIAGNOSIS — Z808 Family history of malignant neoplasm of other organs or systems: Secondary | ICD-10-CM

## 2019-09-13 DIAGNOSIS — E1159 Type 2 diabetes mellitus with other circulatory complications: Secondary | ICD-10-CM

## 2019-09-13 DIAGNOSIS — I25119 Atherosclerotic heart disease of native coronary artery with unspecified angina pectoris: Secondary | ICD-10-CM | POA: Diagnosis not present

## 2019-09-13 DIAGNOSIS — T463X5A Adverse effect of coronary vasodilators, initial encounter: Secondary | ICD-10-CM | POA: Diagnosis not present

## 2019-09-13 DIAGNOSIS — Z833 Family history of diabetes mellitus: Secondary | ICD-10-CM | POA: Diagnosis not present

## 2019-09-13 DIAGNOSIS — I214 Non-ST elevation (NSTEMI) myocardial infarction: Secondary | ICD-10-CM | POA: Diagnosis not present

## 2019-09-13 DIAGNOSIS — I10 Essential (primary) hypertension: Secondary | ICD-10-CM | POA: Diagnosis not present

## 2019-09-13 DIAGNOSIS — Z20822 Contact with and (suspected) exposure to covid-19: Secondary | ICD-10-CM | POA: Diagnosis not present

## 2019-09-13 DIAGNOSIS — Z823 Family history of stroke: Secondary | ICD-10-CM | POA: Diagnosis not present

## 2019-09-13 DIAGNOSIS — Z955 Presence of coronary angioplasty implant and graft: Secondary | ICD-10-CM

## 2019-09-13 DIAGNOSIS — E1169 Type 2 diabetes mellitus with other specified complication: Secondary | ICD-10-CM | POA: Diagnosis not present

## 2019-09-13 DIAGNOSIS — I700 Atherosclerosis of aorta: Secondary | ICD-10-CM | POA: Diagnosis not present

## 2019-09-13 DIAGNOSIS — I25110 Atherosclerotic heart disease of native coronary artery with unstable angina pectoris: Secondary | ICD-10-CM | POA: Diagnosis not present

## 2019-09-13 DIAGNOSIS — Z716 Tobacco abuse counseling: Secondary | ICD-10-CM | POA: Diagnosis not present

## 2019-09-13 LAB — ECHOCARDIOGRAM LIMITED
Height: 72 in
Weight: 3680 oz

## 2019-09-13 LAB — CBC WITH DIFFERENTIAL/PLATELET
Abs Immature Granulocytes: 0.04 10*3/uL (ref 0.00–0.07)
Basophils Absolute: 0 10*3/uL (ref 0.0–0.1)
Basophils Relative: 0 %
Eosinophils Absolute: 0 10*3/uL (ref 0.0–0.5)
Eosinophils Relative: 0 %
HCT: 44.7 % (ref 39.0–52.0)
Hemoglobin: 15.8 g/dL (ref 13.0–17.0)
Immature Granulocytes: 0 %
Lymphocytes Relative: 13 %
Lymphs Abs: 1.3 10*3/uL (ref 0.7–4.0)
MCH: 32.4 pg (ref 26.0–34.0)
MCHC: 35.3 g/dL (ref 30.0–36.0)
MCV: 91.6 fL (ref 80.0–100.0)
Monocytes Absolute: 0.6 10*3/uL (ref 0.1–1.0)
Monocytes Relative: 6 %
Neutro Abs: 8.5 10*3/uL — ABNORMAL HIGH (ref 1.7–7.7)
Neutrophils Relative %: 81 %
Platelets: 306 10*3/uL (ref 150–400)
RBC: 4.88 MIL/uL (ref 4.22–5.81)
RDW: 12.5 % (ref 11.5–15.5)
WBC: 10.5 10*3/uL (ref 4.0–10.5)
nRBC: 0 % (ref 0.0–0.2)

## 2019-09-13 LAB — I-STAT CHEM 8, ED
BUN: 8 mg/dL (ref 6–20)
Calcium, Ion: 1.12 mmol/L — ABNORMAL LOW (ref 1.15–1.40)
Chloride: 98 mmol/L (ref 98–111)
Creatinine, Ser: 1.1 mg/dL (ref 0.61–1.24)
Glucose, Bld: 348 mg/dL — ABNORMAL HIGH (ref 70–99)
HCT: 47 % (ref 39.0–52.0)
Hemoglobin: 16 g/dL (ref 13.0–17.0)
Potassium: 3.9 mmol/L (ref 3.5–5.1)
Sodium: 138 mmol/L (ref 135–145)
TCO2: 28 mmol/L (ref 22–32)

## 2019-09-13 LAB — CBG MONITORING, ED: Glucose-Capillary: 261 mg/dL — ABNORMAL HIGH (ref 70–99)

## 2019-09-13 LAB — COMPREHENSIVE METABOLIC PANEL
ALT: 25 U/L (ref 0–44)
AST: 20 U/L (ref 15–41)
Albumin: 4.3 g/dL (ref 3.5–5.0)
Alkaline Phosphatase: 104 U/L (ref 38–126)
Anion gap: 13 (ref 5–15)
BUN: 8 mg/dL (ref 6–20)
CO2: 25 mmol/L (ref 22–32)
Calcium: 9.7 mg/dL (ref 8.9–10.3)
Chloride: 99 mmol/L (ref 98–111)
Creatinine, Ser: 1.07 mg/dL (ref 0.61–1.24)
GFR calc Af Amer: >60 mL/min (ref >60)
GFR calc non Af Amer: >60 mL/min (ref >60)
Glucose, Bld: 347 mg/dL — ABNORMAL HIGH (ref 70–99)
Potassium: 4 mmol/L (ref 3.5–5.1)
Sodium: 137 mmol/L (ref 135–145)
Total Bilirubin: 0.7 mg/dL (ref 0.3–1.2)
Total Protein: 8.5 g/dL — ABNORMAL HIGH (ref 6.5–8.1)

## 2019-09-13 LAB — RESPIRATORY PANEL BY RT PCR (FLU A&B, COVID)
Influenza A by PCR: NEGATIVE
Influenza B by PCR: NEGATIVE
SARS Coronavirus 2 by RT PCR: NEGATIVE

## 2019-09-13 LAB — GLUCOSE, CAPILLARY
Glucose-Capillary: 254 mg/dL — ABNORMAL HIGH (ref 70–99)
Glucose-Capillary: 277 mg/dL — ABNORMAL HIGH (ref 70–99)

## 2019-09-13 LAB — TROPONIN I (HIGH SENSITIVITY)
Troponin I (High Sensitivity): 61 ng/L — ABNORMAL HIGH (ref <18)
Troponin I (High Sensitivity): 199 ng/L (ref <18)
Troponin I (High Sensitivity): 4154 ng/L (ref <18)
Troponin I (High Sensitivity): 7535 ng/L (ref <18)

## 2019-09-13 LAB — HIV ANTIBODY (ROUTINE TESTING W REFLEX): HIV Screen 4th Generation wRfx: NONREACTIVE

## 2019-09-13 LAB — C-REACTIVE PROTEIN: CRP: 0.7 mg/dL (ref <1.0)

## 2019-09-13 LAB — HEPARIN LEVEL (UNFRACTIONATED): Heparin Unfractionated: 0.32 IU/mL (ref 0.30–0.70)

## 2019-09-13 LAB — TSH: TSH: 0.847 u[IU]/mL (ref 0.350–4.500)

## 2019-09-13 LAB — SEDIMENTATION RATE: Sed Rate: 45 mm/hr — ABNORMAL HIGH (ref 0–16)

## 2019-09-13 LAB — HEMOGLOBIN A1C
Hgb A1c MFr Bld: 11.6 % — ABNORMAL HIGH (ref 4.8–5.6)
Mean Plasma Glucose: 286.22 mg/dL

## 2019-09-13 LAB — MAGNESIUM: Magnesium: 2.2 mg/dL (ref 1.7–2.4)

## 2019-09-13 LAB — LIPASE, BLOOD: Lipase: 22 U/L (ref 11–51)

## 2019-09-13 MED ORDER — NITROGLYCERIN 0.4 MG SL SUBL
0.4000 mg | SUBLINGUAL_TABLET | SUBLINGUAL | Status: DC | PRN
  Administered 2019-09-13 (×3): 0.4 mg via SUBLINGUAL
  Filled 2019-09-13: qty 1

## 2019-09-13 MED ORDER — ASPIRIN 81 MG PO CHEW
324.0000 mg | CHEWABLE_TABLET | ORAL | Status: AC
  Administered 2019-09-13: 18:00:00 324 mg via ORAL
  Filled 2019-09-13: qty 4

## 2019-09-13 MED ORDER — NITROGLYCERIN 0.4 MG SL SUBL: 0.4000 mg | SUBLINGUAL_TABLET | SUBLINGUAL | Status: DC | PRN

## 2019-09-13 MED ORDER — ASPIRIN 300 MG RE SUPP: 300.0000 mg | RECTAL | Status: AC

## 2019-09-13 MED ORDER — HEPARIN BOLUS VIA INFUSION
4000.0000 [IU] | Freq: Once | INTRAVENOUS | Status: AC
  Administered 2019-09-13: 14:00:00 4000 [IU] via INTRAVENOUS
  Filled 2019-09-13: qty 4000

## 2019-09-13 MED ORDER — PANTOPRAZOLE SODIUM 40 MG PO TBEC
40.0000 mg | DELAYED_RELEASE_TABLET | Freq: Every day | ORAL | Status: DC
  Administered 2019-09-14 – 2019-09-16 (×3): 40 mg via ORAL
  Filled 2019-09-13 (×3): qty 1

## 2019-09-13 MED ORDER — INSULIN ASPART 100 UNIT/ML ~~LOC~~ SOLN
0.0000 [IU] | Freq: Every day | SUBCUTANEOUS | Status: DC
  Administered 2019-09-13: 21:00:00 3 [IU] via SUBCUTANEOUS

## 2019-09-13 MED ORDER — ACETAMINOPHEN 325 MG PO TABS
650.0000 mg | ORAL_TABLET | ORAL | Status: DC | PRN
  Administered 2019-09-13 – 2019-09-14 (×4): 650 mg via ORAL
  Filled 2019-09-13 (×4): qty 2

## 2019-09-13 MED ORDER — ONDANSETRON HCL 4 MG/2ML IJ SOLN: 4.0000 mg | Freq: Four times a day (QID) | INTRAMUSCULAR | Status: DC | PRN

## 2019-09-13 MED ORDER — FENTANYL CITRATE (PF) 100 MCG/2ML IJ SOLN
50.0000 ug | Freq: Once | INTRAMUSCULAR | Status: AC
  Administered 2019-09-13: 50 ug via INTRAVENOUS
  Filled 2019-09-13: qty 2

## 2019-09-13 MED ORDER — INSULIN ASPART 100 UNIT/ML ~~LOC~~ SOLN
0.0000 [IU] | Freq: Three times a day (TID) | SUBCUTANEOUS | Status: DC
  Administered 2019-09-13: 18:00:00 8 [IU] via SUBCUTANEOUS
  Administered 2019-09-14: 18:00:00 3 [IU] via SUBCUTANEOUS
  Administered 2019-09-14 (×2): 8 [IU] via SUBCUTANEOUS
  Administered 2019-09-15: 3 [IU] via SUBCUTANEOUS
  Administered 2019-09-15: 11 [IU] via SUBCUTANEOUS
  Administered 2019-09-15: 3 [IU] via SUBCUTANEOUS
  Administered 2019-09-16 (×2): 8 [IU] via SUBCUTANEOUS

## 2019-09-13 MED ORDER — HEPARIN (PORCINE) 25000 UT/250ML-% IV SOLN
1200.0000 [IU]/h | INTRAVENOUS | Status: DC
  Administered 2019-09-13 – 2019-09-15 (×3): 1200 [IU]/h via INTRAVENOUS
  Filled 2019-09-13 (×3): qty 250

## 2019-09-13 MED ORDER — ASPIRIN 81 MG PO CHEW
324.0000 mg | CHEWABLE_TABLET | Freq: Once | ORAL | Status: AC
  Administered 2019-09-13: 11:00:00 324 mg via ORAL
  Filled 2019-09-13: qty 4

## 2019-09-13 MED ORDER — CARVEDILOL 12.5 MG PO TABS
12.5000 mg | ORAL_TABLET | Freq: Two times a day (BID) | ORAL | Status: DC
  Administered 2019-09-13 – 2019-09-16 (×6): 12.5 mg via ORAL
  Filled 2019-09-13 (×6): qty 1

## 2019-09-13 MED ORDER — SODIUM CHLORIDE (PF) 0.9 % IJ SOLN
INTRAMUSCULAR | Status: AC
  Filled 2019-09-13: qty 100

## 2019-09-13 MED ORDER — ATORVASTATIN CALCIUM 80 MG PO TABS
80.0000 mg | ORAL_TABLET | Freq: Every day | ORAL | Status: DC
  Administered 2019-09-13 – 2019-09-15 (×3): 80 mg via ORAL
  Filled 2019-09-13 (×3): qty 1

## 2019-09-13 MED ORDER — SODIUM CHLORIDE 0.9% FLUSH: 3.0000 mL | INTRAVENOUS | Status: DC | PRN

## 2019-09-13 MED ORDER — ASPIRIN EC 81 MG PO TBEC
81.0000 mg | DELAYED_RELEASE_TABLET | Freq: Every day | ORAL | Status: DC
  Administered 2019-09-14 – 2019-09-15 (×2): 81 mg via ORAL
  Filled 2019-09-13 (×2): qty 1

## 2019-09-13 MED ORDER — HYDROMORPHONE HCL 1 MG/ML IJ SOLN
0.5000 mg | Freq: Once | INTRAMUSCULAR | Status: AC
  Administered 2019-09-13: 0.5 mg via INTRAVENOUS
  Filled 2019-09-13: qty 1

## 2019-09-13 MED ORDER — HYDROMORPHONE HCL 1 MG/ML IJ SOLN
0.5000 mg | Freq: Once | INTRAMUSCULAR | Status: AC
  Administered 2019-09-13: 14:00:00 0.5 mg via INTRAVENOUS
  Filled 2019-09-13: qty 1

## 2019-09-13 MED ORDER — SODIUM CHLORIDE 0.9 % IV SOLN: 250.0000 mL | INTRAVENOUS | Status: DC | PRN

## 2019-09-13 MED ORDER — SODIUM CHLORIDE 0.9% FLUSH
3.0000 mL | Freq: Two times a day (BID) | INTRAVENOUS | Status: DC
  Administered 2019-09-13 – 2019-09-15 (×5): 3 mL via INTRAVENOUS

## 2019-09-13 MED ORDER — NITROGLYCERIN IN D5W 200-5 MCG/ML-% IV SOLN
0.0000 ug/min | INTRAVENOUS | Status: DC
  Administered 2019-09-13: 14:00:00 5 ug/min via INTRAVENOUS
  Administered 2019-09-14: 21:00:00 15 ug/min via INTRAVENOUS
  Administered 2019-09-14: 06:00:00 50 ug/min via INTRAVENOUS
  Filled 2019-09-13 (×4): qty 250

## 2019-09-13 MED ORDER — ALPRAZOLAM 0.25 MG PO TABS
0.2500 mg | ORAL_TABLET | Freq: Two times a day (BID) | ORAL | Status: DC | PRN
  Administered 2019-09-16: 0.25 mg via ORAL
  Filled 2019-09-13: qty 1

## 2019-09-13 MED ORDER — ZOLPIDEM TARTRATE 5 MG PO TABS
5.0000 mg | ORAL_TABLET | Freq: Every evening | ORAL | Status: DC | PRN
  Administered 2019-09-15: 5 mg via ORAL
  Filled 2019-09-13: qty 1

## 2019-09-13 MED ORDER — IOHEXOL 350 MG/ML SOLN
100.0000 mL | Freq: Once | INTRAVENOUS | Status: AC | PRN
  Administered 2019-09-13: 100 mL via INTRAVENOUS

## 2019-09-13 MED ORDER — METOPROLOL TARTRATE 25 MG PO TABS
25.0000 mg | ORAL_TABLET | Freq: Once | ORAL | Status: AC
  Administered 2019-09-13: 14:00:00 25 mg via ORAL
  Filled 2019-09-13: qty 1

## 2019-09-13 MED ORDER — FENTANYL CITRATE (PF) 100 MCG/2ML IJ SOLN
50.0000 ug | Freq: Once | INTRAMUSCULAR | Status: AC
  Administered 2019-09-13: 11:00:00 50 ug via INTRAVENOUS
  Filled 2019-09-13: qty 2

## 2019-09-13 NOTE — ED Triage Notes (Signed)
Patient states he was awakened this AM with mid chest pain. Patient also c/o SOB.

## 2019-09-13 NOTE — Progress Notes (Signed)
  Echocardiogram 2D Echocardiogram has been performed.  Kirk Barker 09/13/2019, 5:58 PM

## 2019-09-13 NOTE — ED Provider Notes (Signed)
[facility]-EMERGENCY DEPT Provider Note   CSN: [id number] Arrival date & time: 09/13/19  1016     History Chief Complaint  Patient presents with  . Chest Pain    Kirk Barker is a 44 y.o. male presenting for evaluation of chest pain.  Patient states he was awoken from sleep (around 9AM) with severe midsternal chest pain.  He describes it as a pressure.  He has associated shortness of breath.  Patient states he had some mild discomfort yesterday, but nothing like this.  He reports associated nausea, no vomiting.  He denies fevers, cough, abdominal pain, urinary symptoms, normal bowel movements.  He states he has no medical problems and takes no medications daily.  He has not taken anything for his pain.  He reports tobacco use, denies alcohol use, reports marijuana use, denies other drug use.  He denies a family history of early cardiac death.  Additional history obtained from chart review.  Per chart review, patient is persistently hypertensive in ED, likely has undiagnosed hypertension.  Additionally, patient has been found to have elevated blood sugars and multiple ED visits, likely also has undiagnosed diabetes.  HPI     Past Medical History:  Diagnosis Date  . Median arcuate ligament syndrome [facility])     Patient Active Problem List   Diagnosis Date Noted  . NSTEMI (non-ST elevated myocardial infarction) (HCC) 09/13/2019  . Median arcuate ligament syndrome (HCC) 06/26/2017  . Abdominal pain, epigastric 06/26/2017  . Pancreatitis 06/17/2017    Past Surgical History:  Procedure Laterality Date  . CHOLECYSTECTOMY         Family History  Problem Relation Age of Onset  . Stroke Mother   . Diabetes Father   . Brain cancer Maternal Grandmother     Social History   Tobacco Use  . Smoking status: Current Every Day Smoker    Packs/day: 0.50    Types: Cigarettes  . Smokeless tobacco: Never Used  Substance Use Topics  . Alcohol use: No  . Drug  use: No    Home Medications Prior to Admission medications   Medication Sig Start Date End Date Taking? Authorizing Provider  diltiazem (CARDIZEM CD) 120 MG 24 hr capsule Take 1 capsule (120 mg total) by mouth daily. Patient not taking: Reported on 06/26/2017 06/17/17   Salary, Evelena Asa, MD  Hyoscyamine Sulfate SL (LEVSIN/SL) 0.125 MG SUBL Place 0.125 mg under the tongue every 6 (six) hours as needed. 12/11/17   Triplett, Rulon Eisenmenger B, FNP  metFORMIN (GLUCOPHAGE) 500 MG tablet Take 1 tablet (500 mg total) by mouth 2 (two) times daily with a meal. 12/07/17   Don Perking, [place], MD  metoCLOPramide (REGLAN) 5 MG tablet Take 1 tablet (5 mg total) by mouth 4 (four) times daily -  before meals and at bedtime. Patient not taking: Reported on 06/26/2017 06/17/17 06/17/18  Salary, Jetty Duhamel D, MD  metoprolol tartrate (LOPRESSOR) 100 MG tablet Take 1 tablet (100 mg total) by mouth 2 (two) times daily. Patient not taking: Reported on 06/26/2017 06/17/17   Salary, Jetty Duhamel D, MD  oxyCODONE-acetaminophen (PERCOCET/ROXICET) 5-325 MG tablet Take 1-2 tablets by mouth every 8 (eight) hours as needed for severe pain. Patient not taking: Reported on 06/25/2017 06/17/17   Salary, Jetty Duhamel D, MD  pantoprazole (PROTONIX) 40 MG tablet Take 1 tablet (40 mg total) by mouth daily. Patient not taking: Reported on 06/26/2017 06/17/17 06/17/18  Salary, Jetty Duhamel D, MD  predniSONE (STERAPRED UNI-PAK 21 TAB) 10 MG (21) TBPK tablet As per  packaging instructions 03/03/18   Phineas SemenGoodman, [place], MD    Allergies    Bee venom and Other  Review of Systems   Review of Systems  Respiratory: Positive for shortness of breath.   Cardiovascular: Positive for chest pain.  Gastrointestinal: Positive for nausea.  All other systems reviewed and are negative.   Physical Exam Updated Vital Signs BP (!) 168/110   Pulse 95   Temp 98.6 F (37 C) (Oral)   Resp 12   Ht 6' (1.829 m)   Wt 104.3 kg   SpO2 100%   BMI 31.19 kg/m   Physical Exam Vitals and  nursing note reviewed.  Constitutional:      General: He is in acute distress.     Appearance: He is well-developed. He is ill-appearing.     Comments: Patient appears extremely uncomfortable due to pain, arching off the bed.  HENT:     Head: Normocephalic and atraumatic.  Eyes:     Extraocular Movements: Extraocular movements intact.     Conjunctiva/sclera: Conjunctivae normal.     Pupils: Pupils are equal, round, and reactive to light.  Cardiovascular:     Rate and Rhythm: Regular rhythm. Tachycardia present.     Pulses: Normal pulses.     Comments: Tachycardic around 115 Pulmonary:     Effort: Pulmonary effort is normal. No respiratory distress.     Breath sounds: Normal breath sounds. No wheezing.  Abdominal:     General: There is no distension.     Palpations: Abdomen is soft. There is no mass.     Tenderness: There is no abdominal tenderness. There is no guarding or rebound.  Musculoskeletal:        General: Normal range of motion.     Cervical back: Normal range of motion and neck supple.     Right lower leg: No edema.     Left lower leg: No edema.  Skin:    General: Skin is warm and dry.     Capillary Refill: Capillary refill takes less than 2 seconds.  Neurological:     Mental Status: He is alert and oriented to person, place, and time.     ED Results / Procedures / Treatments   Labs (all labs ordered are listed, but only abnormal results are displayed) Labs Reviewed  CBC WITH DIFFERENTIAL/PLATELET - Abnormal; Notable for the following components:      Result Value   Neutro Abs 8.5 (*)    All other components within normal limits  COMPREHENSIVE METABOLIC PANEL - Abnormal; Notable for the following components:   Glucose, Bld 347 (*)    Total Protein 8.5 (*)    All other components within normal limits  I-STAT CHEM 8, ED - Abnormal; Notable for the following components:   Glucose, Bld 348 (*)    Calcium, Ion 1.12 (*)    All other components within normal  limits  TROPONIN I (HIGH SENSITIVITY) - Abnormal; Notable for the following components:   Troponin I (High Sensitivity) 61 (*)    All other components within normal limits  TROPONIN I (HIGH SENSITIVITY) - Abnormal; Notable for the following components:   Troponin I (High Sensitivity) 199 (*)    All other components within normal limits  RESPIRATORY PANEL BY RT PCR (FLU A&B, COVID)  LIPASE, BLOOD    EKG EKG Interpretation  Date/Time:  Saturday September 13 2019 10:59:41 EDT Ventricular Rate:  106 PR Interval:    QRS Duration: 88 QT Interval:  351 QTC Calculation:  467 R Axis:   63 Text Interpretation: Sinus tachycardia Non-specific ST-t changes Confirmed by Raeford Razor [id number]) on 09/13/2019 11:01:28 AM   Radiology DG Chest Port 1 View  Result Date: 09/13/2019 CLINICAL DATA:  44 year old male with a history chest pain EXAM: PORTABLE CHEST 1 VIEW COMPARISON:  02/22/2018 FINDINGS: Pain cardiomediastinal silhouette unchanged in size and contour. No evidence of central vascular congestion. No pneumothorax or pleural effusion. No confluent airspace disease. No displaced fracture IMPRESSION: Negative for acute cardiopulmonary disease Electronically Signed   By: Gilmer Mor D.O.   On: 09/13/2019 11:13   CT Angio Chest/Abd/Pel for Dissection W and/or W/WO  Result Date: 09/13/2019 CLINICAL DATA:  Acute chest pain beginning at 7 a.m. today. Abnormal EKG. The pain is located anteriorly on the left. Clinical concern for EXAM: CT ANGIOGRAPHY CHEST, ABDOMEN AND PELVIS TECHNIQUE: Non-contrast CT of the chest was initially obtained. Multidetector CT imaging through the chest, abdomen and pelvis was performed using the standard protocol during bolus administration of intravenous contrast. Multiplanar reconstructed images and MIPs were obtained and reviewed to evaluate the vascular anatomy. CONTRAST:  OMNIPAQUE IOHEXOL 350 MG/ML SOLN COMPARISON:  06/17/2017 FINDINGS: CTA CHEST FINDINGS  Cardiovascular: Preferential opacification of the thoracic aorta. No evidence of thoracic aortic aneurysm or dissection. No pulmonary emboli seen. Normal heart size. No pericardial effusion. Mediastinum/Nodes: No enlarged mediastinal, hilar, or axillary lymph nodes. Thyroid gland, trachea, and esophagus demonstrate no significant findings. Lungs/Pleura: Stable scarring at the medial right lung base. No pleural fluid or pneumothorax. Musculoskeletal: Mild thoracic and lower cervical spine degenerative changes. Review of the MIP images confirms the above findings. CTA ABDOMEN AND PELVIS FINDINGS VASCULAR Aorta: Minimal atheromatous calcifications distally. No aneurysm or dissection. Celiac: Patent without evidence of aneurysm, dissection, vasculitis or significant stenosis. SMA: Patent without evidence of aneurysm, dissection, vasculitis or significant stenosis. Renals: Both renal arteries are patent without evidence of aneurysm, dissection, vasculitis, fibromuscular dysplasia or significant stenosis. IMA: Patent without evidence of aneurysm, dissection, vasculitis or significant stenosis. Inflow: Mild bilateral internal iliac artery calcifications. No aneurysm, dissection or stenosis. Veins: No obvious venous abnormality within the limitations of this arterial phase study. Review of the MIP images confirms the above findings. NON-VASCULAR Hepatobiliary: No focal liver abnormality is seen. Status post cholecystectomy. No biliary dilatation. Pancreas: Unremarkable. No pancreatic ductal dilatation or surrounding inflammatory changes. Spleen: Normal in size without focal abnormality. Adrenals/Urinary Tract: Adrenal glands are unremarkable. Kidneys are normal, without renal calculi, focal lesion, or hydronephrosis. Bladder is unremarkable. Stomach/Bowel: Stomach is within normal limits. Appendix appears normal. No evidence of bowel wall thickening, distention, or inflammatory changes. Lymphatic: No enlarged lymph nodes.  Reproductive: Prostate is unremarkable. Other: No abdominal wall hernia or abnormality. No abdominopelvic ascites. Musculoskeletal: Mild lumbar spine degenerative changes. Review of the MIP images confirms the above findings. IMPRESSION: 1. No aortic dissection or aneurysm. 2. Minimal calcified aortic atherosclerosis. 3. No pulmonary emboli. 4. No acute abnormality in the chest, abdomen or pelvis. Aortic Atherosclerosis (ICD10-I70.0). Electronically Signed   By: Beckie Salts M.D.   On: 09/13/2019 11:51    Procedures .Critical Care Performed by: Alveria Apley, PA-C Authorized by: Alveria Apley, PA-C   Critical care provider statement:    Critical care time (minutes):  45   Critical care time was exclusive of:  Separately billable procedures and treating other patients and teaching time   Critical care was necessary to treat or prevent imminent or life-threatening deterioration of the following conditions:  Cardiac failure   Critical care  was time spent personally by me on the following activities:  Blood draw for specimens, development of treatment plan with patient or surrogate, discussions with consultants, evaluation of patient's response to treatment, examination of patient, obtaining history from patient or surrogate, ordering and performing treatments and interventions, ordering and review of laboratory studies, ordering and review of radiographic studies, pulse oximetry, re-evaluation of patient's condition and review of old charts   I assumed direction of critical care for this patient from another provider in my specialty: no   Comments:     Patient presenting with acute onset of chest pain concerning for NSTEMI.  Evaded troponins, started on heparin and nitro drip, and admitted to cardiology service.   (including critical care time)  Medications Ordered in ED Medications  nitroGLYCERIN (NITROSTAT) SL tablet 0.4 mg (0.4 mg Sublingual Given 09/13/19 1109)  sodium chloride (PF) 0.9  % injection (has no administration in time range)  nitroGLYCERIN 50 mg in dextrose 5 % 250 mL (0.2 mg/mL) infusion (5 mcg/min Intravenous Rate/Dose Verify 09/13/19 1434)  heparin ADULT infusion 100 units/mL (25000 units/254mL sodium chloride 0.45%) (1,200 Units/hr Intravenous New Bag/Given 09/13/19 1415)  aspirin chewable tablet 324 mg (324 mg Oral Given 09/13/19 1047)  fentaNYL (SUBLIMAZE) injection 50 mcg (50 mcg Intravenous Given 09/13/19 1052)  iohexol (OMNIPAQUE) 350 MG/ML injection 100 mL (100 mLs Intravenous Contrast Given 09/13/19 1113)  fentaNYL (SUBLIMAZE) injection 50 mcg (50 mcg Intravenous Given 09/13/19 1142)  HYDROmorphone (DILAUDID) injection 0.5 mg (0.5 mg Intravenous Given 09/13/19 1242)  HYDROmorphone (DILAUDID) injection 0.5 mg (0.5 mg Intravenous Given 09/13/19 1354)  metoprolol tartrate (LOPRESSOR) tablet 25 mg (25 mg Oral Given 09/13/19 1354)  heparin bolus via infusion 4,000 Units (4,000 Units Intravenous Bolus from Bag 09/13/19 1414)    ED Course  I have reviewed the triage vital signs and the nursing notes.  Pertinent labs & imaging results that were available during my care of the patient were reviewed by me and considered in my medical decision making (see chart for details).    MDM Rules/Calculators/A&P                      Patient presenting for evaluation of chest pain.  On exam, patient appears extremely uncomfortable, arching off the bed.  History is concerning in that he likely has undiagnosed/unmanaged hypertension and diabetes.  Heart score of 5.  EKG change from previous, but not consistent with STEMI.  Patient is extremely hypertensive upon arrival, concern for possible dissection.  Will obtain CTA.  We will also obtain cardiac work-up labs/labs.  Aspirin and nitro given. Case discussed with attending, Dr. Juleen China evaluated the pt.   Patient reports no improvement of pain with rebound and nitro.  Fentanyl given with mild improvement.  Patient once again reporting  10 out of 10 pain, another round of fentanyl given.  Labs interpreted by me.  Elevated troponin at 61, will need delta.  Regardless, patient will likely need to be admitted to the hospital.  Hyperglycemia at 350 without signs of DKA.  Repeat EKG unchanged.  Patient continues to report severe pain, Dilaudid given.  CTA negative for dissection or PE.  Chest x-ray viewed interpreted by me, no pneumonia pneumothorax, effusion.  Repeat troponin I 199.  Concerning for NSTEMI, heparin gtt started.  Will consult with cardiology.  Discussed with Dr. Cristal Deer up from cardiology, who recommended nitro drip, beta-blocker, and admission to cardiology service.  Patient will likely need a heart cath within the next  few days.   Final Clinical Impression(s) / ED Diagnoses Final diagnoses:  NSTEMI (non-ST elevated myocardial infarction) [facility])    Rx / [place] Orders ED Discharge Orders    None       Franchot Heidelberg, PA-C 09/13/19 1434    Virgel Manifold, MD 09/17/19 [phone]

## 2019-09-13 NOTE — ED Provider Notes (Signed)
Medical screening examination/treatment/procedure(s) were conducted as a shared visit with non-physician practitioner(s) and myself.  I personally evaluated the patient during the encounter.  EKG Interpretation  Date/Time:  Saturday September 13 2019 10:59:41 EDT Ventricular Rate:  106 PR Interval:    QRS Duration: 88 QT Interval:  351 QTC Calculation: 467 R Axis:   63 Text Interpretation: Sinus tachycardia Non-specific ST-t changes Confirmed by Raeford Razor [phone]) on 09/13/2019 11:01:28 AM  44yM with CP. Appeared very uncomfortable on arrival. EKG with concerning changes inferiorly but I think this is more PR depression than actual STE. Also some STE in V6. Improved on subsequent tracings. Dissection study negative. Still having CP but this has been constant since onset. Troponin elevated and increasing although not dramatically. Dissection study negative. With elevation in trop and ongoing CP, will admit. Heparin. Cardiology consult.   CRITICAL CARE Performed by: Raeford Razor Total critical care time: 35 minutes Critical care time was exclusive of separately billable procedures and treating other patients. Critical care was necessary to treat or prevent imminent or life-threatening deterioration. Critical care was time spent personally by me on the following activities: development of treatment plan with patient and/or surrogate as well as nursing, discussions with consultants, evaluation of patient's response to treatment, examination of patient, obtaining history from patient or surrogate, ordering and performing treatments and interventions, ordering and review of laboratory studies, ordering and review of radiographic studies, pulse oximetry and re-evaluation of patient's condition.    Raeford Razor, MD 09/13/19 1357

## 2019-09-13 NOTE — ED Notes (Signed)
Attempted to call report to [facility], nurse will call back.  Contacted [facility] for transport to [facility].

## 2019-09-13 NOTE — H&P (Addendum)
Cardiology Admission History and Physical:   [name] MRN: [id number]; DOB: [birth year]   Admission date: 09/13/2019  Primary Care Provider: [facility] [place] Primary Cardiologist: New Primary Electrophysiologist:  None   Chief Complaint:  Chest pain  Patient Profile:   Kirk Barker is a 44 y.o. male with pmh of undiagnosed DM2, chronic epigastric pain, pancreatitis, GERD, tobacco use, and median arcuate ligament syndrome who is being seen for chest pain.   History of Present Illness:   [name] has not been seen by cardiology in the past. No pmh of MI, stent, heart failure, stroke,HLD, HTN. Care everywhere reports a h/o fo DM2 but patient says he is not taking anything for this. Patient reports tobacco (1/2 ppd) Denies alcohol/drug use. He has chronic daily lower chest pain and epigastric pain for the last 5-6 years for which he has undergone multiple imaging studies and EGDs ultimately diagnosed with MARS. He does not take any medications for HTN or HLD although EHR shows Lopressor and diltiazem. He says the only medication he takes is antacid for GERD. Denies family history of cardiac disease.   The patient presented to [facility] ED 09/13/19 for chest pain. Pain has been progressively worsening over the last week. The first episode patient had mid-sternal chest pressure that lasted about 10 minutes and self resolved. A couple days later he had the same pain but it was worse and for a longer time period. Today patient was awakened from sleep from severe midsternal chest pain. It is a pressure and non radiating. He had associated SOB and diaphoresis. No N/V or diaphoresis. He did not take anything for the pain. Denies fever, chills, recent illness.  Pain is different from chronic epigastric/lower chest pain he has had in the past.   In the ED BP 192/122, pulse 119, afebrile, RR 17, 97% O2. Labs show glucose 348, potassium 4.0, creatinine 1.07, lipase and LFTS  wnl. WBC 10.5, Hgb 15.8. HS troponin 61>199. EKG with some minimal ST elevation inferolateral leads. CXR unremarkable. CTA chest/abd/pelvis negative for dissection, aneurysm, and PE; minimal calcified aortic atherosclerosis. He was given NTG, aspirin, and fentanyl for the pain. Started on IV heparin. CP persisted and he was started on IV NTG. COVID negative. Patient was transferred to [facility] for further work-up.   Upon arrival to [facility] he reported persistent pain and nitro gtt was titrated. Says he had a few hours of being chest pain free after SL NTG while at [facility] ED. On interview he appears uncomfortable from the pain. BP also severely elevated which he says happens when he has severe pain. EKG ordered.    Past Medical History:  Diagnosis Date  . Median arcuate ligament syndrome [facility])     Past Surgical History:  Procedure Laterality Date  . CHOLECYSTECTOMY       Medications Prior to Admission: Prior to Admission medications   Medication Sig Start Date End Date Taking? Authorizing Provider  diltiazem (CARDIZEM CD) 120 MG 24 hr capsule Take 1 capsule (120 mg total) by mouth daily. Patient not taking: Reported on 06/26/2017 06/17/17   Salary, Evelena Asa, MD  Hyoscyamine Sulfate SL (LEVSIN/SL) 0.125 MG SUBL Place 0.125 mg under the tongue every 6 (six) hours as needed. 12/11/17   Triplett, Rulon Eisenmenger B, FNP  metFORMIN (GLUCOPHAGE) 500 MG tablet Take 1 tablet (500 mg total) by mouth 2 (two) times daily with a meal. 12/07/17   Don Perking, [place], MD  metoCLOPramide (REGLAN) 5 MG tablet Take 1  tablet (5 mg total) by mouth 4 (four) times daily -  before meals and at bedtime. Patient not taking: Reported on 06/26/2017 06/17/17 06/17/18  Salary, [name], MD  metoprolol tartrate (LOPRESSOR) 100 MG tablet Take 1 tablet (100 mg total) by mouth 2 (two) times daily. Patient not taking: Reported on 06/26/2017 06/17/17   Salary, [name], MD  oxyCODONE-acetaminophen (PERCOCET/ROXICET) 5-325 MG tablet Take 1-2 tablets by  mouth every 8 (eight) hours as needed for severe pain. Patient not taking: Reported on 06/25/2017 06/17/17   Salary, [name], MD  pantoprazole (PROTONIX) 40 MG tablet Take 1 tablet (40 mg total) by mouth daily. Patient not taking: Reported on 06/26/2017 06/17/17 06/17/18  Salary, [name], MD  predniSONE (STERAPRED UNI-PAK 21 TAB) 10 MG (21) TBPK tablet As per packaging instructions 03/03/18   Phineas SemenGoodman, [place], MD     Allergies:    Allergies  Allergen Reactions  . Bee Venom   . Other Itching    Social History:   Social History   Socioeconomic History  . Marital status: Single    Spouse name: Not on file  . Number of children: Not on file  . Years of education: Not on file  . Highest education level: Not on file  Occupational History    Employer: [employer]  Tobacco Use  . Smoking status: Current Every Day Smoker    Packs/day: 0.50    Types: Cigarettes  . Smokeless tobacco: Never Used  Substance and Sexual Activity  . Alcohol use: No  . Drug use: No  . Sexual activity: Yes  Other Topics Concern  . Not on file  Social History Narrative  . Not on file   Social Determinants of Health   Financial Resource Strain:   . Difficulty of Paying Living Expenses:   Food Insecurity:   . Worried About Programme researcher, broadcasting/film/videounning Out of Food in the Last Year:   . Baristaan Out of Food in the Last Year:   Transportation Needs:   . Freight forwarderLack of Transportation (Medical):   Marland Kitchen. Lack of Transportation (Non-Medical):   Physical Activity:   . Days of Exercise per Week:   . Minutes of Exercise per Session:   Stress:   . Feeling of Stress :   Social Connections:   . Frequency of Communication with Friends and Family:   . Frequency of Social Gatherings with Friends and Family:   . Attends Religious Services:   . Active Member of Clubs or Organizations:   . Attends BankerClub or Organization Meetings:   Marland Kitchen. Marital Status:   Intimate Partner Violence:   . Fear of Current or Ex-Partner:   . Emotionally Abused:   Marland Kitchen.  Physically Abused:   . Sexually Abused:     Family History:   The patient's family history includes Brain cancer in his maternal grandmother; Diabetes in his father; Stroke in his mother.    ROS:  Please see the history of present illness.  All other ROS reviewed and negative.     Physical Exam/Data:   Vitals:   09/13/19 1605 09/13/19 1608 09/13/19 1622 09/13/19 1627  BP: (!) 170/109  (!) 166/109 (!) 165/121  Pulse: 78  96 84  Resp: 17  17 13   Temp: 98.6 F (37 C) 98.6 F (37 C)    TempSrc: Oral Oral    SpO2: 100%  98% 98%  Weight:      Height:        Intake/Output Summary (Last 24 hours) at 09/13/2019 1628  Last data filed at 09/13/2019 1434 Gross per 24 hour  Intake 0.72 ml  Output --  Net 0.72 ml   Last 3 Weights 09/13/2019 03/03/2018 02/22/2018  Weight (lbs) 230 lb 230 lb 220 lb  Weight (kg) 104.327 kg 104.327 kg 99.791 kg     Body mass index is 31.19 kg/m.  General:  Well nourished, well developed with active 5/10 chest pain HEENT: normal Lymph: no adenopathy Neck: no JVD Endocrine:  No thryomegaly Vascular: No carotid bruits; FA pulses 2+ bilaterally without bruits  Cardiac:  normal S1, S2; RRR; no murmur Lungs:  clear to auscultation bilaterally, no wheezing, rhonchi or rales  Abd: soft, nontender, no hepatomegaly  Ext: no edema Musculoskeletal:  No deformities, BUE and BLE strength normal and equal Skin: warm and dry  Neuro:  CNs 2-12 intact, no focal abnormalities noted Psych:  Normal affect    EKG:  The ECG that was done 09/13/19 was personally reviewed and demonstrates sinus tach, 117 bpm with minimal ST elevation inferolateral leads  Relevant CV Studies:  Will order Echo  Laboratory Data:  High Sensitivity Troponin:   Recent Labs  Lab 09/13/19 1044 09/13/19 1233  TROPONINIHS 61* 199*      Chemistry Recent Labs  Lab 09/13/19 1044 09/13/19 1052  NA 137 138  K 4.0 3.9  CL 99 98  CO2 25  --   GLUCOSE 347* 348*  BUN 8 8  CREATININE 1.07  1.10  CALCIUM 9.7  --   GFRNONAA >60  --   GFRAA >60  --   ANIONGAP 13  --     Recent Labs  Lab 09/13/19 1044  PROT 8.5*  ALBUMIN 4.3  AST 20  ALT 25  ALKPHOS 104  BILITOT 0.7   Hematology Recent Labs  Lab 09/13/19 1044 09/13/19 1052  WBC 10.5  --   RBC 4.88  --   HGB 15.8 16.0  HCT 44.7 47.0  MCV 91.6  --   MCH 32.4  --   MCHC 35.3  --   RDW 12.5  --   PLT 306  --    BNPNo results for input(s): BNP, PROBNP in the last 168 hours.  DDimer No results for input(s): DDIMER in the last 168 hours.   Radiology/Studies:  DG Chest Port 1 View  Result Date: 09/13/2019 CLINICAL DATA:  44 year old male with a history chest pain EXAM: PORTABLE CHEST 1 VIEW COMPARISON:  02/22/2018 FINDINGS: Pain cardiomediastinal silhouette unchanged in size and contour. No evidence of central vascular congestion. No pneumothorax or pleural effusion. No confluent airspace disease. No displaced fracture IMPRESSION: Negative for acute cardiopulmonary disease Electronically Signed   By: Gilmer Mor D.O.   On: 09/13/2019 11:13   CT Angio Chest/Abd/Pel for Dissection W and/or W/WO  Result Date: 09/13/2019 CLINICAL DATA:  Acute chest pain beginning at 7 a.m. today. Abnormal EKG. The pain is located anteriorly on the left. Clinical concern for EXAM: CT ANGIOGRAPHY CHEST, ABDOMEN AND PELVIS TECHNIQUE: Non-contrast CT of the chest was initially obtained. Multidetector CT imaging through the chest, abdomen and pelvis was performed using the standard protocol during bolus administration of intravenous contrast. Multiplanar reconstructed images and MIPs were obtained and reviewed to evaluate the vascular anatomy. CONTRAST:  OMNIPAQUE IOHEXOL 350 MG/ML SOLN COMPARISON:  06/17/2017 FINDINGS: CTA CHEST FINDINGS Cardiovascular: Preferential opacification of the thoracic aorta. No evidence of thoracic aortic aneurysm or dissection. No pulmonary emboli seen. Normal heart size. No pericardial effusion.  Mediastinum/Nodes: No enlarged mediastinal, hilar,  or axillary lymph nodes. Thyroid gland, trachea, and esophagus demonstrate no significant findings. Lungs/Pleura: Stable scarring at the medial right lung base. No pleural fluid or pneumothorax. Musculoskeletal: Mild thoracic and lower cervical spine degenerative changes. Review of the MIP images confirms the above findings. CTA ABDOMEN AND PELVIS FINDINGS VASCULAR Aorta: Minimal atheromatous calcifications distally. No aneurysm or dissection. Celiac: Patent without evidence of aneurysm, dissection, vasculitis or significant stenosis. SMA: Patent without evidence of aneurysm, dissection, vasculitis or significant stenosis. Renals: Both renal arteries are patent without evidence of aneurysm, dissection, vasculitis, fibromuscular dysplasia or significant stenosis. IMA: Patent without evidence of aneurysm, dissection, vasculitis or significant stenosis. Inflow: Mild bilateral internal iliac artery calcifications. No aneurysm, dissection or stenosis. Veins: No obvious venous abnormality within the limitations of this arterial phase study. Review of the MIP images confirms the above findings. NON-VASCULAR Hepatobiliary: No focal liver abnormality is seen. Status post cholecystectomy. No biliary dilatation. Pancreas: Unremarkable. No pancreatic ductal dilatation or surrounding inflammatory changes. Spleen: Normal in size without focal abnormality. Adrenals/Urinary Tract: Adrenal glands are unremarkable. Kidneys are normal, without renal calculi, focal lesion, or hydronephrosis. Bladder is unremarkable. Stomach/Bowel: Stomach is within normal limits. Appendix appears normal. No evidence of bowel wall thickening, distention, or inflammatory changes. Lymphatic: No enlarged lymph nodes. Reproductive: Prostate is unremarkable. Other: No abdominal wall hernia or abnormality. No abdominopelvic ascites. Musculoskeletal: Mild lumbar spine degenerative changes. Review of the MIP  images confirms the above findings. IMPRESSION: 1. No aortic dissection or aneurysm. 2. Minimal calcified aortic atherosclerosis. 3. No pulmonary emboli. 4. No acute abnormality in the chest, abdomen or pelvis. Aortic Atherosclerosis (ICD10-I70.0). Electronically Signed   By: Beckie Salts M.D.   On: 09/13/2019 11:51    TIMI Risk Score for Unstable Angina or Non-ST Elevation MI:   The patient's TIMI risk score is 3, which indicates a 13% risk of all cause mortality, new or recurrent myocardial infarction or need for urgent revascularization in the next 14 days.   Assessment and Plan:   NSTEMI Presented with progressive UA relieved with NTG. Hs trop rising 61>199. EKG with possible ischemic changes, possible PR depression, although no reciprocal changes. Imaging negative for PE, dissection, aneurysm. Started on IV heparin and IV NTG. BP also severely elevated in the ED.  - admit to cardiology - continue to trend troponin - order echo - Risk stratify with A1C and lipid panel.  - check TSH - Start coreg 12.5mg  BID - continue IV heparin and IV NTG - Start Aspirin daily - will also check inflammatory markers for minimal ST elevation inferolateral leads - Plan for cardiac cath. Creatinine stable. Cath possible Monday if CP under control Risks and benefits of cardiac catheterization have been discussed with the patient.  These include bleeding, infection, kidney damage, stroke, heart attack, death.  The patient understands these risks and is willing to proceed. - MD to see  HTN - 192/122 on admission started on IV NTG - Not on meds at baseline - pressures elevated. Start coreg as above.   GERD/chronic epigastric pain - long history of epigastric pain  - continue protonix  DM2 - BG levels 200-300s. Patient says might have had diabetes in the past - SSI for now - check A1C   Severity of Illness: The appropriate patient status for this patient is INPATIENT. Inpatient status is judged to  be reasonable and necessary in order to provide the required intensity of service to ensure the patient's safety. The patient's presenting symptoms, physical exam findings, and initial  radiographic and laboratory data in the context of their chronic comorbidities is felt to place them at high risk for further clinical deterioration. Furthermore, it is not anticipated that the patient will be medically stable for discharge from the hospital within 2 midnights of admission. The following factors support the patient status of inpatient.   " The patient's presenting symptoms include chest pain. " The worrisome physical exam findings include chest pain. " The initial radiographic and laboratory data are worrisome because of elevated troponin. " The chronic co-morbidities include DM2, chronic abdominal pain.   * I certify that at the point of admission it is my clinical judgment that the patient will require inpatient hospital care spanning beyond 2 midnights from the point of admission due to high intensity of service, high risk for further deterioration and high frequency of surveillance required.*    For questions or updates, please contact [place] Please consult [email or web address] for contact info under        Signed, Essica Kiker Ninfa Meeker, PA-C  09/13/2019 4:28 PM

## 2019-09-13 NOTE — ED Triage Notes (Signed)
Patient reports chest pains starting at 0700 today, waking him out of his sleep. Abnormal EKG, left side chest pain, nitro given at ED at 1044. Pain 10/10.

## 2019-09-13 NOTE — ED Notes (Signed)
Patient (and wife) at bedside notified of patient's NPO status.

## 2019-09-13 NOTE — ED Notes (Addendum)
Date and time results received: 09/13/19 1:29 PM  (use smartphrase ".now" to insert current time)  Test: Troponin Critical Value: 199  Name of Provider Notified: Juleen China & Sophia PA

## 2019-09-13 NOTE — ED Notes (Signed)
Report given to Sam at [facility].

## 2019-09-13 NOTE — Progress Notes (Addendum)
Pt arrived to unit on IV hep and IV nitro at , still c/o of CP 4/10.  RN paged Cardiology.   Cristal Deer, MD and Fransico Michael, [place] arrived at bedside. Received verbal order to tiltrate up his Nitro until CP resolved and stat EKG.  Nitro titrated to and pt has no more CP.    Will keep NPO for now for possible cath, update on oncoming RN

## 2019-09-13 NOTE — Progress Notes (Addendum)
ANTICOAGULATION CONSULT NOTE - Initial Consult  Pharmacy Consult for heparin Indication: chest pain/ACS  Allergies  Allergen Reactions  . Bee Venom   . Other Itching    Patient Measurements: Height: 6' (182.9 cm) Weight: 104.3 kg (230 lb) IBW/kg (Calculated) : 77.6 Heparin Dosing Weight: 99 kg  Vital Signs: Temp: 98.6 F (37 C) (04/17 1058) Temp Source: Oral (04/17 1058) BP: 168/110 (04/17 1315) Pulse Rate: 95 (04/17 1315)  Labs: Recent Labs    09/13/19 1044 09/13/19 1052 09/13/19 1233  HGB 15.8 16.0  --   HCT 44.7 47.0  --   PLT 306  --   --   CREATININE 1.07 1.10  --   TROPONINIHS 61*  --  199*    Estimated Creatinine Clearance: 104.8 mL/min (by C-G formula based on SCr of 1.1 mg/dL).   Medical History: Past Medical History:  Diagnosis Date  . Median arcuate ligament syndrome (HCC)     Medications: No PTA medications  Assessment: 42 yoM admitted with chest pain. Pt not on any medications PTA, including anticoagulants. Pharmacy consulted to dose/monitor heparin drip for ACS.  Today, 09/13/19 -CBC: WNL -SCr 1.1, CrCl > 60 mL/min  Goal of Therapy:  Heparin level 0.3-0.7 units/ml Monitor platelets by anticoagulation protocol: Yes   Plan:  Give 4000 units bolus x 1 Start heparin infusion at 1200 units/hr Check anti-Xa level in 6 hours and daily while on heparin Continue to monitor H&H and platelets  Cindi Carbon, PharmD 09/13/2019,1:47 PM

## 2019-09-13 NOTE — Progress Notes (Signed)
ANTICOAGULATION CONSULT NOTE  Pharmacy Consult for heparin Indication: chest pain/ACS  Allergies  Allergen Reactions  . Bee Venom   . Other Itching    Patient Measurements: Height: 6' (182.9 cm) Weight: 104.3 kg (230 lb) IBW/kg (Calculated) : 77.6 Heparin Dosing Weight: 99 kg  Vital Signs: Temp: 98.9 F (37.2 C) (04/17 2021) Temp Source: Oral (04/17 2021) BP: 149/92 (04/17 2021) Pulse Rate: 96 (04/17 2021)  Labs: Recent Labs    09/13/19 1044 09/13/19 1044 09/13/19 1052 09/13/19 1233 09/13/19 1652 09/13/19 1927 09/13/19 2046  HGB 15.8  --  16.0  --   --   --   --   HCT 44.7  --  47.0  --   --   --   --   PLT 306  --   --   --   --   --   --   HEPARINUNFRC  --   --   --   --   --   --  0.32  CREATININE 1.07  --  1.10  --   --   --   --   TROPONINIHS 61*   < >  --  199* 4,154* 7,535*  --    < > = values in this interval not displayed.    Estimated Creatinine Clearance: 104.8 mL/min (by C-G formula based on SCr of 1.1 mg/dL).   Medical History: Past Medical History:  Diagnosis Date  . Median arcuate ligament syndrome (HCC)     Medications: No PTA medications  Assessment: 47 yoM admitted with chest pain. Pt not on any medications PTA, including anticoagulants. Pharmacy consulted to dose/monitor heparin drip for ACS.  Initial heparin level at goal on 1200 units/hr. No bleeding issues noted.   Goal of Therapy:  Heparin level 0.3-0.7 units/ml Monitor platelets by anticoagulation protocol: Yes   Plan:  Continue heparin at 1200 units/hr Recheck cbc and heparin level with am labs  Sheppard Coil PharmD., BCPS Clinical Pharmacist 09/13/2019 9:13 PM

## 2019-09-14 ENCOUNTER — Inpatient Hospital Stay (HOSPITAL_COMMUNITY): Payer: Medicaid Other

## 2019-09-14 DIAGNOSIS — Z716 Tobacco abuse counseling: Secondary | ICD-10-CM

## 2019-09-14 DIAGNOSIS — Z720 Tobacco use: Secondary | ICD-10-CM | POA: Diagnosis not present

## 2019-09-14 DIAGNOSIS — E7800 Pure hypercholesterolemia, unspecified: Secondary | ICD-10-CM

## 2019-09-14 DIAGNOSIS — R079 Chest pain, unspecified: Secondary | ICD-10-CM

## 2019-09-14 DIAGNOSIS — E1169 Type 2 diabetes mellitus with other specified complication: Secondary | ICD-10-CM

## 2019-09-14 DIAGNOSIS — E119 Type 2 diabetes mellitus without complications: Secondary | ICD-10-CM

## 2019-09-14 DIAGNOSIS — I10 Essential (primary) hypertension: Secondary | ICD-10-CM | POA: Diagnosis not present

## 2019-09-14 DIAGNOSIS — E1159 Type 2 diabetes mellitus with other circulatory complications: Secondary | ICD-10-CM

## 2019-09-14 DIAGNOSIS — I214 Non-ST elevation (NSTEMI) myocardial infarction: Secondary | ICD-10-CM | POA: Diagnosis not present

## 2019-09-14 LAB — BASIC METABOLIC PANEL
Anion gap: 11 (ref 5–15)
BUN: 9 mg/dL (ref 6–20)
CO2: 23 mmol/L (ref 22–32)
Calcium: 9.4 mg/dL (ref 8.9–10.3)
Chloride: 101 mmol/L (ref 98–111)
Creatinine, Ser: 1.19 mg/dL (ref 0.61–1.24)
GFR calc Af Amer: >60 mL/min (ref >60)
GFR calc non Af Amer: >60 mL/min (ref >60)
Glucose, Bld: 244 mg/dL — ABNORMAL HIGH (ref 70–99)
Potassium: 3.6 mmol/L (ref 3.5–5.1)
Sodium: 135 mmol/L (ref 135–145)

## 2019-09-14 LAB — CBC
HCT: 40.4 % (ref 39.0–52.0)
Hemoglobin: 14.5 g/dL (ref 13.0–17.0)
MCH: 32 pg (ref 26.0–34.0)
MCHC: 35.9 g/dL (ref 30.0–36.0)
MCV: 89.2 fL (ref 80.0–100.0)
Platelets: 286 10*3/uL (ref 150–400)
RBC: 4.53 MIL/uL (ref 4.22–5.81)
RDW: 12.5 % (ref 11.5–15.5)
WBC: 10.5 10*3/uL (ref 4.0–10.5)
nRBC: 0 % (ref 0.0–0.2)

## 2019-09-14 LAB — GLUCOSE, CAPILLARY
Glucose-Capillary: 298 mg/dL — ABNORMAL HIGH (ref 70–99)
Glucose-Capillary: 255 mg/dL — ABNORMAL HIGH (ref 70–99)
Glucose-Capillary: 166 mg/dL — ABNORMAL HIGH (ref 70–99)
Glucose-Capillary: 180 mg/dL — ABNORMAL HIGH (ref 70–99)

## 2019-09-14 LAB — LIPID PANEL
Cholesterol: 175 mg/dL (ref 0–200)
HDL: 37 mg/dL — ABNORMAL LOW (ref >40)
LDL Cholesterol: 126 mg/dL — ABNORMAL HIGH (ref 0–99)
Total CHOL/HDL Ratio: 4.7 RATIO
Triglycerides: 61 mg/dL (ref <150)
VLDL: 12 mg/dL (ref 0–40)

## 2019-09-14 LAB — HEPARIN LEVEL (UNFRACTIONATED): Heparin Unfractionated: 0.42 IU/mL (ref 0.30–0.70)

## 2019-09-14 LAB — ECHOCARDIOGRAM COMPLETE
Height: 72 in
Weight: 3573.22 oz

## 2019-09-14 MED ORDER — OXYCODONE HCL 5 MG PO TABS
5.0000 mg | ORAL_TABLET | ORAL | Status: DC | PRN
  Administered 2019-09-14 – 2019-09-15 (×7): 5 mg via ORAL
  Filled 2019-09-14 (×8): qty 1

## 2019-09-14 MED ORDER — ACETAMINOPHEN 325 MG PO TABS
650.0000 mg | ORAL_TABLET | Freq: Four times a day (QID) | ORAL | Status: AC
  Administered 2019-09-14 – 2019-09-15 (×5): 650 mg via ORAL
  Filled 2019-09-14 (×5): qty 2

## 2019-09-14 MED ORDER — MORPHINE SULFATE (PF) 2 MG/ML IV SOLN
2.0000 mg | Freq: Once | INTRAVENOUS | Status: AC
  Administered 2019-09-14: 2 mg via INTRAVENOUS
  Filled 2019-09-14: qty 1

## 2019-09-14 NOTE — Progress Notes (Signed)
Pt c/o pressure chest pain 10/10. V/S stable. Ntg gtt increased to 60 mcg/min. EKG done. Dr Mayford Knife informed. Currently pt is chest pain free. Will continue to monitor pt.

## 2019-09-14 NOTE — Progress Notes (Signed)
*  PRELIMINARY RESULTS* Echocardiogram 2D Echocardiogram has been performed.  Stacey Drain 09/14/2019, 4:05 PM

## 2019-09-14 NOTE — Progress Notes (Signed)
Progress Note  Patient Name: Kirk Barker Date of Encounter: 09/14/2019  Primary Cardiologist: Buford Dresser, MD   Subjective   He was chest pain free on the nitro drip for most of the evening, but he had an episode about 4:30 AM of 10/10 chest pain. Nitro drip was uptitrated to 60 mcg/min and the pain resolved. However, he has a terrible nitro headache that has not responded to tylenol. We will intensify his pain control today.  We reviewed the results of his limited echo, which showed wall motion abnormalities in the inferior/inferolateral wall. His troponins also continue to rise, most recent 7535. His A1c is 11.6, and his LDL is 126. His ESR/CRP are not significantly elevated to suggest a myocarditis. TSH normal  We discussed that taken together, this is highly suggestive of an MI.  Blood pressure much better controlled now that the pain is controlled.  He is understandably overwhelmed and emotional about these diagnoses. We spent significant time together discussing what this means long term and how we can manage. He is very committed to doing whatever it takes to get his health improved.  Inpatient Medications    Scheduled Meds: . acetaminophen  650 mg Oral Q6H  . aspirin EC  81 mg Oral Daily  . atorvastatin  80 mg Oral q1800  . carvedilol  12.5 mg Oral BID WC  . insulin aspart  0-15 Units Subcutaneous TID WC  . insulin aspart  0-5 Units Subcutaneous QHS  .  morphine injection  2 mg Intravenous Once  . pantoprazole  40 mg Oral Daily  . sodium chloride flush  3 mL Intravenous Q12H   Continuous Infusions: . sodium chloride    . heparin 1,200 Units/hr (09/14/19 0640)  . nitroGLYCERIN 50 mcg/min (09/14/19 0640)   PRN Meds: sodium chloride, ALPRAZolam, nitroGLYCERIN, ondansetron (ZOFRAN) IV, oxyCODONE, sodium chloride flush, zolpidem   Vital Signs    Vitals:   09/13/19 2040 09/13/19 2137 09/14/19 0020 09/14/19 0407  BP: (!) 153/99 (!) 140/99 130/83 124/90    Pulse: 95 96 88 96  Resp: 16 20 20 14   Temp:   98.4 F (36.9 C) 98.5 F (36.9 C)  TempSrc:   Oral Oral  SpO2: 99% 100% 93% 100%  Weight:    101.3 kg  Height:        Intake/Output Summary (Last 24 hours) at 09/14/2019 0939 Last data filed at 09/14/2019 0854 Gross per 24 hour  Intake 396.32 ml  Output 300 ml  Net 96.32 ml   Last 3 Weights 09/14/2019 09/13/2019 03/03/2018  Weight (lbs) 223 lb 5.2 oz 230 lb 230 lb  Weight (kg) 101.3 kg 104.327 kg 104.327 kg      Telemetry    NSR - Personally Reviewed  ECG    NSR with mild flattening of T waves, no ST elevations that meet STEMI criteria - Personally Reviewed  Physical Exam   GEN: No acute distress.   Neck: No JVD Cardiac: RRR, no murmurs, rubs, or gallops.  Respiratory: Clear to auscultation bilaterally. GI: Soft, nontender, non-distended  MS: No edema; No deformity. Neuro:  Nonfocal  Psych: Normal affect   Labs    High Sensitivity Troponin:   Recent Labs  Lab 09/13/19 1044 09/13/19 1233 09/13/19 1652 09/13/19 1927  TROPONINIHS 61* 199* 4,154* 7,535*      Chemistry Recent Labs  Lab 09/13/19 1044 09/13/19 1052 09/14/19 0332  NA 137 138 135  K 4.0 3.9 3.6  CL 99 98 101  CO2 25  --  23  GLUCOSE 347* 348* 244*  BUN 8 8 9   CREATININE 1.07 1.10 1.19  CALCIUM 9.7  --  9.4  PROT 8.5*  --   --   ALBUMIN 4.3  --   --   AST 20  --   --   ALT 25  --   --   ALKPHOS 104  --   --   BILITOT 0.7  --   --   GFRNONAA >60  --  >60  GFRAA >60  --  >60  ANIONGAP 13  --  11     Hematology Recent Labs  Lab 09/13/19 1044 09/13/19 1052 09/14/19 0332  WBC 10.5  --  10.5  RBC 4.88  --  4.53  HGB 15.8 16.0 14.5  HCT 44.7 47.0 40.4  MCV 91.6  --  89.2  MCH 32.4  --  32.0  MCHC 35.3  --  35.9  RDW 12.5  --  12.5  PLT 306  --  286    BNPNo results for input(s): BNP, PROBNP in the last 168 hours.   DDimer No results for input(s): DDIMER in the last 168 hours.   Radiology    DG Chest Port 1 View  Result  Date: 09/13/2019 CLINICAL DATA:  44 year old male with a history chest pain EXAM: PORTABLE CHEST 1 VIEW COMPARISON:  02/22/2018 FINDINGS: Pain cardiomediastinal silhouette unchanged in size and contour. No evidence of central vascular congestion. No pneumothorax or pleural effusion. No confluent airspace disease. No displaced fracture IMPRESSION: Negative for acute cardiopulmonary disease Electronically Signed   By: Corrie Mckusick D.O.   On: 09/13/2019 11:13   CT Angio Chest/Abd/Pel for Dissection W and/or W/WO  Result Date: 09/13/2019 CLINICAL DATA:  Acute chest pain beginning at 7 a.m. today. Abnormal EKG. The pain is located anteriorly on the left. Clinical concern for EXAM: CT ANGIOGRAPHY CHEST, ABDOMEN AND PELVIS TECHNIQUE: Non-contrast CT of the chest was initially obtained. Multidetector CT imaging through the chest, abdomen and pelvis was performed using the standard protocol during bolus administration of intravenous contrast. Multiplanar reconstructed images and MIPs were obtained and reviewed to evaluate the vascular anatomy. CONTRAST:  159m OMNIPAQUE IOHEXOL 350 MG/ML SOLN COMPARISON:  06/17/2017 FINDINGS: CTA CHEST FINDINGS Cardiovascular: Preferential opacification of the thoracic aorta. No evidence of thoracic aortic aneurysm or dissection. No pulmonary emboli seen. Normal heart size. No pericardial effusion. Mediastinum/Nodes: No enlarged mediastinal, hilar, or axillary lymph nodes. Thyroid gland, trachea, and esophagus demonstrate no significant findings. Lungs/Pleura: Stable scarring at the medial right lung base. No pleural fluid or pneumothorax. Musculoskeletal: Mild thoracic and lower cervical spine degenerative changes. Review of the MIP images confirms the above findings. CTA ABDOMEN AND PELVIS FINDINGS VASCULAR Aorta: Minimal atheromatous calcifications distally. No aneurysm or dissection. Celiac: Patent without evidence of aneurysm, dissection, vasculitis or significant stenosis. SMA:  Patent without evidence of aneurysm, dissection, vasculitis or significant stenosis. Renals: Both renal arteries are patent without evidence of aneurysm, dissection, vasculitis, fibromuscular dysplasia or significant stenosis. IMA: Patent without evidence of aneurysm, dissection, vasculitis or significant stenosis. Inflow: Mild bilateral internal iliac artery calcifications. No aneurysm, dissection or stenosis. Veins: No obvious venous abnormality within the limitations of this arterial phase study. Review of the MIP images confirms the above findings. NON-VASCULAR Hepatobiliary: No focal liver abnormality is seen. Status post cholecystectomy. No biliary dilatation. Pancreas: Unremarkable. No pancreatic ductal dilatation or surrounding inflammatory changes. Spleen: Normal in size without focal abnormality. Adrenals/Urinary Tract: Adrenal glands are unremarkable. Kidneys are normal, without  renal calculi, focal lesion, or hydronephrosis. Bladder is unremarkable. Stomach/Bowel: Stomach is within normal limits. Appendix appears normal. No evidence of bowel wall thickening, distention, or inflammatory changes. Lymphatic: No enlarged lymph nodes. Reproductive: Prostate is unremarkable. Other: No abdominal wall hernia or abnormality. No abdominopelvic ascites. Musculoskeletal: Mild lumbar spine degenerative changes. Review of the MIP images confirms the above findings. IMPRESSION: 1. No aortic dissection or aneurysm. 2. Minimal calcified aortic atherosclerosis. 3. No pulmonary emboli. 4. No acute abnormality in the chest, abdomen or pelvis. Aortic Atherosclerosis (ICD10-I70.0). Electronically Signed   By: Claudie Revering M.D.   On: 09/13/2019 11:51   ECHOCARDIOGRAM LIMITED  Result Date: 09/13/2019    ECHOCARDIOGRAM LIMITED REPORT   Patient Name:   SAIFULLAH JOLLEY Date of Exam: 09/13/2019 Medical Rec #:  [id number]    Height:       72.0 in Accession #:    [id number]   Weight:       230.0 lb Date of Birth:  03-13-1976     BSA:           2.261 m Patient Age:    43 years     BP:           162/111 mmHg Patient Gender: M            HR:           74 bpm. Exam Location:  Inpatient Procedure: Limited Echo and Color Doppler Indications:    Chest Pain 786.50 / R07.9  History:        Patient has no prior history of Echocardiogram examinations.                 Acute MI.  Sonographer:    Clayton Lefort RDCS (AE) Referring Phys: [id number] [place]  1. Left ventricular ejection fraction, by estimation, is 60 to 65%. The left ventricle has normal function. The left ventricle demonstrates regional wall motion abnormalities (see scoring diagram/findings for description). There is mild concentric left ventricular hypertrophy. Left ventricular diastolic function could not be evaluated. There is hypokinesis of the left ventricular, entire inferolateral wall and inferior wall.  2. Right ventricular systolic function was not well visualized. The right ventricular size is normal.  3. The mitral valve is normal in structure. Trivial mitral valve regurgitation. No evidence of mitral stenosis.  4. The aortic valve was not assessed. FINDINGS  Left Ventricle: Left ventricular ejection fraction, by estimation, is 60 to 65%. The left ventricle has normal function. The left ventricle demonstrates regional wall motion abnormalities. The left ventricular internal cavity size was normal in size. There is mild concentric left ventricular hypertrophy. Right Ventricle: The right ventricular size is normal. No increase in right ventricular wall thickness. Right ventricular systolic function was not well visualized. Left Atrium: Left atrial size was not assessed. Right Atrium: Right atrial size was not assessed. Pericardium: There is no evidence of pericardial effusion. Mitral Valve: The mitral valve is normal in structure. Normal mobility of the mitral valve leaflets. Trivial mitral valve regurgitation. No evidence of mitral valve stenosis. Tricuspid Valve:  The tricuspid valve is normal in structure. Aortic Valve: The aortic valve was not assessed. Pulmonic Valve: The pulmonic valve was not well visualized. Aorta: The aortic root was not well visualized. Venous: The inferior vena cava was not well visualized. IAS/Shunts: No atrial level shunt detected by color flow Doppler.  LEFT VENTRICLE PLAX 2D LVIDd:         5.00 cm LVIDs:  4.10 cm LV PW:         1.40 cm LV IVS:        1.20 cm  LV Volumes (MOD) LV vol d, MOD A2C: 112.0 ml LV vol d, MOD A4C: 118.0 ml LV vol s, MOD A2C: 63.8 ml LV vol s, MOD A4C: 71.2 ml LV SV MOD A2C:     48.2 ml LV SV MOD A4C:     118.0 ml LV SV MOD BP:      47.4 ml LEFT ATRIUM         Index LA diam:    3.00 cm 1.33 cm/m   AORTA Ao Root diam: 3.50 cm Fransico Him MD Electronically signed by Fransico Him MD Signature Date/Time: 09/13/2019/7:26:44 PM    Final     Cardiac Studies   Limited echo, above  Patient Profile     44 y.o. male with previously undiagnosed type II diabetes (A1c 11.6), chronic epigastric pain with pancreatitis, GERD, median arcuate ligament syndrome, tobacco abuse, hypertension who presented with chest pain. ECG, clinical scenario, and hsTn support NSTEMI.  Assessment & Plan    NSTEMI: -rising hsTn, most recent >7k -on heparin drip -on nitro drip -daily aspirin -started atorvastatin -started carvedilol 12.5 mg BID. With BP improving with pain control, may need to decrease this if his blood pressure drops to allow for max nitro drip for pain relief -risk factors: type II diabetes (A1c 11.6), tobacco use -limited echo with wall motion abnormalities in inferior/inferolateral leads. Full echo pending. -ESR/CRP and TSH unremarkable -plan for cath tomorrow, unless he develops chest pain that cannot be controlled with nitro drip. Consented yesterday. NPO at midnight. -will ask cardiac rehab to see tomorrow  Tobacco use: The patient was counseled on tobacco cessation today for 5 minutes.  Counseling  included reviewing the risks of smoking tobacco products, how it impacts the patient's current medical diagnoses and different strategies for quitting.  Pharmacotherapy to aid in tobacco cessation was not prescribed today. He is committed to no more smoking.  Type II diabetes: -A1c 11.6, supporting that this is significant and not new. On no meds at home. He thinks he may have been told this in the past but hasn't taken meds -will ask the diabetes educator to see him this admission -on SSI, but will need medications for home. Need to see if SGLT2i would be covered given his CAD. Will likely need insulin given A1c, but he is hesitant about injections.  Hypertension: -much improved with pain control -taking no meds at home -started carvedilol here  Hypercholesterolemia: -LDL goal <70, was 126 this admission -continue atorvastatin 80 mg daily, recheck as outpatient  We spent 45 minutes together at bedside today discussing all of the above. He will see me as an outpatient once he is out of the hospital. He would also like a referral to [facility] at discharge. He lives in [place] but works in [place], so he would prefer/is willing to travel within the [place] area for care.  For questions or updates, please contact [place] Please consult [email or web address] for contact info under        Signed, Buford Dresser, MD  09/14/2019, 9:39 AM

## 2019-09-14 NOTE — Progress Notes (Signed)
ANTICOAGULATION CONSULT NOTE  Pharmacy Consult for heparin Indication: chest pain/ACS  Allergies  Allergen Reactions  . Bee Venom   . Other Itching    Patient Measurements: Height: 6' (182.9 cm) Weight: 101.3 kg (223 lb 5.2 oz) IBW/kg (Calculated) : 77.6 Heparin Dosing Weight: 99 kg  Vital Signs: Temp: 98.5 F (36.9 C) (04/18 0407) Temp Source: Oral (04/18 0407) BP: 124/90 (04/18 0407) Pulse Rate: 96 (04/18 0407)  Labs: Recent Labs    09/13/19 1044 09/13/19 1044 09/13/19 1052 09/13/19 1233 09/13/19 1652 09/13/19 1927 09/13/19 2046 09/14/19 0332  HGB 15.8   < > 16.0  --   --   --   --  14.5  HCT 44.7  --  47.0  --   --   --   --  40.4  PLT 306  --   --   --   --   --   --  286  HEPARINUNFRC  --   --   --   --   --   --  0.32 0.42  CREATININE 1.07  --  1.10  --   --   --   --  1.19  TROPONINIHS 61*   < >  --  199* 4,154* 7,535*  --   --    < > = values in this interval not displayed.    Estimated Creatinine Clearance: 95.6 mL/min (by C-G formula based on SCr of 1.19 mg/dL).   Medical History: Past Medical History:  Diagnosis Date  . Median arcuate ligament syndrome (HCC)     Assessment: 20 yoM admitted with chest pain. Pt not on anticoagulation PTA.  Pharmacy consulted to dose/monitor heparin drip for ACS.Plans for cath on 4/19 -heparin level at goal  Goal of Therapy:  Heparin level 0.3-0.7 units/ml Monitor platelets by anticoagulation protocol: Yes   Plan:  Continue heparin at 1200 units/hr Daily heparin level and CBC  Harland German, PharmD Clinical Pharmacist **Pharmacist phone directory can now be found on [email or web address] ([email or web address]).  Listed under [facility].

## 2019-09-14 NOTE — Progress Notes (Signed)
Pt's wife requested to talk to Cristal Deer, MD. RN paged MD.

## 2019-09-15 ENCOUNTER — Encounter (HOSPITAL_COMMUNITY)
Admission: EM | Disposition: A | Discharge status: Home / Self Care | Payer: Self-pay | Source: Home / Self Care | Attending: Cardiology

## 2019-09-15 DIAGNOSIS — I214 Non-ST elevation (NSTEMI) myocardial infarction: Secondary | ICD-10-CM | POA: Diagnosis not present

## 2019-09-15 DIAGNOSIS — E7800 Pure hypercholesterolemia, unspecified: Secondary | ICD-10-CM | POA: Diagnosis not present

## 2019-09-15 DIAGNOSIS — I2510 Atherosclerotic heart disease of native coronary artery without angina pectoris: Secondary | ICD-10-CM

## 2019-09-15 DIAGNOSIS — I10 Essential (primary) hypertension: Secondary | ICD-10-CM | POA: Diagnosis not present

## 2019-09-15 DIAGNOSIS — Z720 Tobacco use: Secondary | ICD-10-CM | POA: Diagnosis not present

## 2019-09-15 HISTORY — PX: LEFT HEART CATH AND CORONARY ANGIOGRAPHY: CATH118249

## 2019-09-15 LAB — CBC
HCT: 40.3 % (ref 39.0–52.0)
Hemoglobin: 14.2 g/dL (ref 13.0–17.0)
MCH: 31.9 pg (ref 26.0–34.0)
MCHC: 35.2 g/dL (ref 30.0–36.0)
MCV: 90.6 fL (ref 80.0–100.0)
Platelets: 286 10*3/uL (ref 150–400)
RBC: 4.45 MIL/uL (ref 4.22–5.81)
RDW: 12.6 % (ref 11.5–15.5)
WBC: 11 10*3/uL — ABNORMAL HIGH (ref 4.0–10.5)
nRBC: 0 % (ref 0.0–0.2)

## 2019-09-15 LAB — BASIC METABOLIC PANEL
Anion gap: 13 (ref 5–15)
BUN: 9 mg/dL (ref 6–20)
CO2: 23 mmol/L (ref 22–32)
Calcium: 9.5 mg/dL (ref 8.9–10.3)
Chloride: 97 mmol/L — ABNORMAL LOW (ref 98–111)
Creatinine, Ser: 1.18 mg/dL (ref 0.61–1.24)
GFR calc Af Amer: >60 mL/min (ref >60)
GFR calc non Af Amer: >60 mL/min (ref >60)
Glucose, Bld: 178 mg/dL — ABNORMAL HIGH (ref 70–99)
Potassium: 3.4 mmol/L — ABNORMAL LOW (ref 3.5–5.1)
Sodium: 133 mmol/L — ABNORMAL LOW (ref 135–145)

## 2019-09-15 LAB — GLUCOSE, CAPILLARY
Glucose-Capillary: 177 mg/dL — ABNORMAL HIGH (ref 70–99)
Glucose-Capillary: 178 mg/dL — ABNORMAL HIGH (ref 70–99)
Glucose-Capillary: 329 mg/dL — ABNORMAL HIGH (ref 70–99)
Glucose-Capillary: 188 mg/dL — ABNORMAL HIGH (ref 70–99)

## 2019-09-15 LAB — POCT ACTIVATED CLOTTING TIME
Activated Clotting Time: 301 seconds
Activated Clotting Time: 290 seconds

## 2019-09-15 LAB — HEPARIN LEVEL (UNFRACTIONATED): Heparin Unfractionated: 0.35 IU/mL (ref 0.30–0.70)

## 2019-09-15 SURGERY — LEFT HEART CATH AND CORONARY ANGIOGRAPHY
Anesthesia: LOCAL

## 2019-09-15 MED ORDER — MIDAZOLAM HCL 2 MG/2ML IJ SOLN
INTRAMUSCULAR | Status: DC | PRN
  Administered 2019-09-15 (×2): 1 mg via INTRAVENOUS

## 2019-09-15 MED ORDER — LIDOCAINE HCL (PF) 1 % IJ SOLN
INTRAMUSCULAR | Status: AC
  Filled 2019-09-15: qty 30

## 2019-09-15 MED ORDER — SODIUM CHLORIDE 0.9 % IV SOLN: INTRAVENOUS | Status: AC

## 2019-09-15 MED ORDER — TICAGRELOR 90 MG PO TABS
90.0000 mg | ORAL_TABLET | Freq: Two times a day (BID) | ORAL | Status: DC
  Administered 2019-09-15 – 2019-09-16 (×2): 90 mg via ORAL
  Filled 2019-09-15 (×2): qty 1

## 2019-09-15 MED ORDER — SODIUM CHLORIDE 0.9 % IV SOLN: 250.0000 mL | INTRAVENOUS | Status: DC | PRN

## 2019-09-15 MED ORDER — LABETALOL HCL 5 MG/ML IV SOLN: 10.0000 mg | INTRAVENOUS | Status: AC | PRN

## 2019-09-15 MED ORDER — HEPARIN (PORCINE) IN NACL 1000-0.9 UT/500ML-% IV SOLN
INTRAVENOUS | Status: DC | PRN
  Administered 2019-09-15 (×2): 500 mL

## 2019-09-15 MED ORDER — LIVING WELL WITH DIABETES BOOK
Freq: Once | Status: AC
  Filled 2019-09-15 (×2): qty 1

## 2019-09-15 MED ORDER — LOSARTAN POTASSIUM 50 MG PO TABS
50.0000 mg | ORAL_TABLET | Freq: Every day | ORAL | Status: DC
  Administered 2019-09-16: 50 mg via ORAL
  Filled 2019-09-15: qty 1

## 2019-09-15 MED ORDER — MIDAZOLAM HCL 2 MG/2ML IJ SOLN
INTRAMUSCULAR | Status: AC
  Filled 2019-09-15: qty 2

## 2019-09-15 MED ORDER — NITROGLYCERIN 1 MG/10 ML FOR IR/CATH LAB
INTRA_ARTERIAL | Status: DC | PRN
  Administered 2019-09-15 (×2): 200 ug via INTRACORONARY

## 2019-09-15 MED ORDER — HEPARIN (PORCINE) IN NACL 1000-0.9 UT/500ML-% IV SOLN
INTRAVENOUS | Status: AC
  Filled 2019-09-15: qty 1000

## 2019-09-15 MED ORDER — VERAPAMIL HCL 2.5 MG/ML IV SOLN
INTRAVENOUS | Status: AC
  Filled 2019-09-15: qty 2

## 2019-09-15 MED ORDER — VERAPAMIL HCL 2.5 MG/ML IV SOLN
INTRAVENOUS | Status: DC | PRN
  Administered 2019-09-15: 10 mL via INTRA_ARTERIAL

## 2019-09-15 MED ORDER — IOHEXOL 350 MG/ML SOLN
INTRAVENOUS | Status: DC | PRN
  Administered 2019-09-15: 150 mL via INTRA_ARTERIAL

## 2019-09-15 MED ORDER — SODIUM CHLORIDE 0.9% FLUSH: 3.0000 mL | INTRAVENOUS | Status: DC | PRN

## 2019-09-15 MED ORDER — LABETALOL HCL 5 MG/ML IV SOLN
INTRAVENOUS | Status: AC
  Filled 2019-09-15: qty 4

## 2019-09-15 MED ORDER — ASPIRIN 81 MG PO CHEW: 81.0000 mg | CHEWABLE_TABLET | ORAL | Status: DC

## 2019-09-15 MED ORDER — SODIUM CHLORIDE 0.9% FLUSH
3.0000 mL | Freq: Two times a day (BID) | INTRAVENOUS | Status: DC
  Administered 2019-09-15 – 2019-09-16 (×2): 3 mL via INTRAVENOUS

## 2019-09-15 MED ORDER — ASPIRIN 81 MG PO CHEW
81.0000 mg | CHEWABLE_TABLET | Freq: Every day | ORAL | Status: DC
  Administered 2019-09-16: 81 mg via ORAL
  Filled 2019-09-15: qty 1

## 2019-09-15 MED ORDER — TICAGRELOR 90 MG PO TABS
ORAL_TABLET | ORAL | Status: DC | PRN
  Administered 2019-09-15: 180 mg via ORAL

## 2019-09-15 MED ORDER — NITROGLYCERIN IN D5W 200-5 MCG/ML-% IV SOLN
0.0000 ug/min | INTRAVENOUS | Status: DC
  Administered 2019-09-15: 14:00:00 15 ug/min via INTRAVENOUS

## 2019-09-15 MED ORDER — LOSARTAN POTASSIUM 25 MG PO TABS
25.0000 mg | ORAL_TABLET | Freq: Every day | ORAL | Status: DC
  Administered 2019-09-15: 11:00:00 25 mg via ORAL
  Filled 2019-09-15: qty 1

## 2019-09-15 MED ORDER — FENTANYL CITRATE (PF) 100 MCG/2ML IJ SOLN
INTRAMUSCULAR | Status: AC
  Filled 2019-09-15: qty 2

## 2019-09-15 MED ORDER — ACETAMINOPHEN 325 MG PO TABS: 650.0000 mg | ORAL_TABLET | ORAL | Status: DC | PRN

## 2019-09-15 MED ORDER — FENTANYL CITRATE (PF) 100 MCG/2ML IJ SOLN
INTRAMUSCULAR | Status: DC | PRN
  Administered 2019-09-15: 50 ug via INTRAVENOUS
  Administered 2019-09-15: 25 ug via INTRAVENOUS

## 2019-09-15 MED ORDER — HEPARIN SODIUM (PORCINE) 1000 UNIT/ML IJ SOLN
INTRAMUSCULAR | Status: DC | PRN
  Administered 2019-09-15: 2500 [IU] via INTRAVENOUS
  Administered 2019-09-15: 2000 [IU] via INTRAVENOUS
  Administered 2019-09-15 (×2): 5000 [IU] via INTRAVENOUS

## 2019-09-15 MED ORDER — SODIUM CHLORIDE 0.9 % WEIGHT BASED INFUSION
1.0000 mL/kg/h | INTRAVENOUS | Status: DC
  Administered 2019-09-15: 12:00:00 1 mL/kg/h via INTRAVENOUS

## 2019-09-15 MED ORDER — HEPARIN SODIUM (PORCINE) 1000 UNIT/ML IJ SOLN
INTRAMUSCULAR | Status: AC
  Filled 2019-09-15: qty 1

## 2019-09-15 MED ORDER — LABETALOL HCL 5 MG/ML IV SOLN
INTRAVENOUS | Status: DC | PRN
  Administered 2019-09-15 (×2): 10 mg via INTRAVENOUS

## 2019-09-15 MED ORDER — HYDRALAZINE HCL 20 MG/ML IJ SOLN
10.0000 mg | INTRAMUSCULAR | Status: AC | PRN
  Administered 2019-09-15: 10 mg via INTRAVENOUS
  Filled 2019-09-15: qty 1

## 2019-09-15 MED ORDER — HEPARIN SODIUM (PORCINE) 5000 UNIT/ML IJ SOLN
5000.0000 [IU] | Freq: Three times a day (TID) | INTRAMUSCULAR | Status: DC
  Administered 2019-09-15 – 2019-09-16 (×2): 5000 [IU] via SUBCUTANEOUS
  Filled 2019-09-15: qty 1

## 2019-09-15 MED ORDER — SODIUM CHLORIDE 0.9 % WEIGHT BASED INFUSION
3.0000 mL/kg/h | INTRAVENOUS | Status: DC
  Administered 2019-09-15: 3 mL/kg/h via INTRAVENOUS

## 2019-09-15 MED ORDER — OXYCODONE HCL 5 MG PO TABS
10.0000 mg | ORAL_TABLET | ORAL | Status: DC | PRN
  Administered 2019-09-15: 10 mg via ORAL

## 2019-09-15 MED ORDER — LIDOCAINE HCL (PF) 1 % IJ SOLN
INTRAMUSCULAR | Status: DC | PRN
  Administered 2019-09-15: 2 mL via INTRADERMAL

## 2019-09-15 MED ORDER — NITROGLYCERIN 1 MG/10 ML FOR IR/CATH LAB
INTRA_ARTERIAL | Status: AC
  Filled 2019-09-15: qty 10

## 2019-09-15 MED ORDER — SODIUM CHLORIDE 0.9 % WEIGHT BASED INFUSION: 3.0000 mL/kg/h | INTRAVENOUS | Status: DC

## 2019-09-15 MED ORDER — TICAGRELOR 90 MG PO TABS
ORAL_TABLET | ORAL | Status: AC
  Filled 2019-09-15: qty 2

## 2019-09-15 MED ORDER — ONDANSETRON HCL 4 MG/2ML IJ SOLN: 4.0000 mg | Freq: Four times a day (QID) | INTRAMUSCULAR | Status: DC | PRN

## 2019-09-15 MED ORDER — SODIUM CHLORIDE 0.9 % WEIGHT BASED INFUSION: 1.0000 mL/kg/h | INTRAVENOUS | Status: DC

## 2019-09-15 MED ORDER — SODIUM CHLORIDE 0.9% FLUSH
3.0000 mL | Freq: Two times a day (BID) | INTRAVENOUS | Status: DC
  Administered 2019-09-15: 12:00:00 3 mL via INTRAVENOUS

## 2019-09-15 SURGICAL SUPPLY — 18 items
BALLN EMERGE MR 2.25X12 (BALLOONS) ×2
BALLN SAPPHIRE ~~LOC~~ 2.75X12 (BALLOONS) ×1 IMPLANT
BALLOON EMERGE MR 2.25X12 (BALLOONS) IMPLANT
CATH 5FR JL3.5 JR4 ANG PIG MP (CATHETERS) ×1 IMPLANT
CATH VISTA GUIDE 6FR XB3.5 (CATHETERS) ×1 IMPLANT
DEVICE RAD COMP TR BAND LRG (VASCULAR PRODUCTS) ×1 IMPLANT
GLIDESHEATH SLEND A-KIT 6F 22G (SHEATH) ×1 IMPLANT
GUIDEWIRE INQWIRE 1.5J.035X260 (WIRE) IMPLANT
INQWIRE 1.5J .035X260CM (WIRE) ×2
KIT ENCORE 26 ADVANTAGE (KITS) ×1 IMPLANT
KIT HEART LEFT (KITS) ×2 IMPLANT
PACK CARDIAC CATHETERIZATION (CUSTOM PROCEDURE TRAY) ×2 IMPLANT
SHEATH PROBE COVER 6X72 (BAG) ×1 IMPLANT
STENT RESOLUTE ONYX 2.5X18 (Permanent Stent) ×1 IMPLANT
TRANSDUCER W/STOPCOCK (MISCELLANEOUS) ×2 IMPLANT
TUBING CIL FLEX 10 FLL-RA (TUBING) ×2 IMPLANT
WIRE ASAHI PROWATER 180CM (WIRE) ×1 IMPLANT
WIRE COUGAR XT STRL 190CM (WIRE) ×1 IMPLANT

## 2019-09-15 NOTE — Progress Notes (Signed)
ANTICOAGULATION CONSULT NOTE  Pharmacy Consult for heparin Indication: chest pain/ACS  Allergies  Allergen Reactions  . Bee Venom   . Other Itching    Patient Measurements: Height: 6' (182.9 cm) Weight: 98.7 kg (217 lb 9.5 oz) IBW/kg (Calculated) : 77.6 Heparin Dosing Weight: 99 kg  Vital Signs: Temp: 98.8 F (37.1 C) (04/19 0558) Temp Source: Oral (04/18 2100) BP: 150/83 (04/19 0558) Pulse Rate: 82 (04/19 0558)  Labs: Recent Labs    09/13/19 1044 09/13/19 1044 09/13/19 1052 09/13/19 1052 09/13/19 1233 09/13/19 1652 09/13/19 1927 09/13/19 2046 09/14/19 0332 09/15/19 0357  HGB 15.8   < > 16.0   < >  --   --   --   --  14.5 14.2  HCT 44.7   < > 47.0  --   --   --   --   --  40.4 40.3  PLT 306  --   --   --   --   --   --   --  286 286  HEPARINUNFRC  --   --   --   --   --   --   --  0.32 0.42 0.35  CREATININE 1.07   < > 1.10  --   --   --   --   --  1.19 1.18  TROPONINIHS 61*   < >  --   --  199* 4,154* 7,535*  --   --   --    < > = values in this interval not displayed.    Estimated Creatinine Clearance: 95.2 mL/min (by C-G formula based on SCr of 1.18 mg/dL).   Medical History: Past Medical History:  Diagnosis Date  . Median arcuate ligament syndrome (HCC)     Assessment: 53 yoM admitted with chest pain. Pt not on anticoagulation PTA.  Pharmacy consulted to dose/monitor heparin drip for ACS. Plans for cath on 4/19. Heparin remains therapeutic, CBC stable.   Goal of Therapy:  Heparin level 0.3-0.7 units/ml Monitor platelets by anticoagulation protocol: Yes   Plan:  Continue heparin at 1200 units/hr Daily heparin level and CBC  Fredonia Highland, PharmD, BCPS Clinical Pharmacist [phone] Please check AMION for all [facility] numbers 09/15/2019

## 2019-09-15 NOTE — CV Procedure (Signed)
   Non-ST elevation myocardial infarction is indication.  Coronary angiography, left ventriculography, and stent implantation first obtuse marginal.  Real-time vascular ultrasound used to achieve access in the right radial.  99% [place] 101 bifurcation stenosis in the mid first obtuse marginal.  The branch has a 90 degree angle from the straight segment.  Single stent deployed across a coequal sized branch 2.25 x 18 Onyx postdilated 2.75 in the proximal two thirds of the stent.  The jailed segment contained ostial to proximal 40% narrowing that increased to approximately 60% but with TIMI grade III flow.  Moderate diffuse plaque in RCA, LAD, circumflex, with widely patent left main.  Mid to distal left ventricular branch contains 75% narrowing.  LVEF 50%.  LVEDP 23 mmHg.

## 2019-09-15 NOTE — Interval H&P Note (Signed)
Cath Lab Visit (complete for each Cath Lab visit)  Clinical Evaluation Leading to the Procedure:   ACS: Yes.    Non-ACS:    Anginal Classification: CCS IV  Anti-ischemic medical therapy: Maximal Therapy (2 or more classes of medications)  Non-Invasive Test Results: No non-invasive testing performed  Prior CABG: No previous CABG      History and Physical Interval Note:  09/15/2019 12:47 PM  Kirk Barker  has presented today for surgery, with the diagnosis of NSTEMI.  The various methods of treatment have been discussed with the patient and family. After consideration of risks, benefits and other options for treatment, the patient has consented to  Procedure(s): LEFT HEART CATH AND CORONARY ANGIOGRAPHY (N/A) as a surgical intervention.  The patient's history has been reviewed, patient examined, no change in status, stable for surgery.  I have reviewed the patient's chart and labs.  Questions were answered to the patient's satisfaction.     Lyn Records III

## 2019-09-15 NOTE — Progress Notes (Addendum)
Inpatient Diabetes Program Recommendations  AACE/ADA: New Consensus Statement on Inpatient Glycemic Control (2015)  Target Ranges:  Prepandial:   less than 140 mg/dL      Peak postprandial:   less than 180 mg/dL (1-2 hours)      Critically ill patients:  140 - 180 mg/dL   Lab Results  Component Value Date   GLUCAP 329 (H) 09/15/2019   HGBA1C 11.6 (H) 09/13/2019    Review of Glycemic Control  Diabetes history: DM2 Outpatient Diabetes medications: None Current orders for Inpatient glycemic control: Novolog 0-15 units tidwc and hs  HgbA1C - 11.6% - poor glycemic control 177, 178, 329 mg/dL  Inpatient Diabetes Program Recommendations:     For discharge:  Farxiga 10 mg QD Metformin 500 mg bid  Spoke to pt at bedside regarding HgbA1C and goals.  Will need to f/u with PCP for diabetes management within a week of discharge. May need insulin (when eating at home) meal coverage.  Thank you. Ailene Ards, RD, LDN, CDE Inpatient Diabetes Coordinator [phone]

## 2019-09-15 NOTE — Progress Notes (Addendum)
Progress Note  Patient Name: Kirk Barker Date of Encounter: 09/15/2019  Primary Cardiologist: Jodelle Red, MD   Subjective   Denies any chest pain, continue to have severe headache.   Inpatient Medications    Scheduled Meds: . acetaminophen  650 mg Oral Q6H  . aspirin EC  81 mg Oral Daily  . atorvastatin  80 mg Oral q1800  . carvedilol  12.5 mg Oral BID WC  . insulin aspart  0-15 Units Subcutaneous TID WC  . insulin aspart  0-5 Units Subcutaneous QHS  . pantoprazole  40 mg Oral Daily  . sodium chloride flush  3 mL Intravenous Q12H   Continuous Infusions: . sodium chloride    . heparin 1,200 Units/hr (09/15/19 3546)  . nitroGLYCERIN 15 mcg/min (09/15/19 0015)   PRN Meds: sodium chloride, ALPRAZolam, nitroGLYCERIN, ondansetron (ZOFRAN) IV, oxyCODONE, sodium chloride flush, zolpidem   Vital Signs    Vitals:   09/14/19 2100 09/15/19 0500 09/15/19 0558 09/15/19 0804  BP: 127/72  (!) 150/83 (!) 144/83  Pulse: 85  82   Resp: 19  20   Temp: 98 F (36.7 C)  98.8 F (37.1 C)   TempSrc: Oral     SpO2: 99%  100%   Weight:  98.7 kg    Height:        Intake/Output Summary (Last 24 hours) at 09/15/2019 0913 Last data filed at 09/15/2019 0015 Gross per 24 hour  Intake 974.19 ml  Output 300 ml  Net 674.19 ml   Last 3 Weights 09/15/2019 09/14/2019 09/13/2019  Weight (lbs) 217 lb 9.5 oz 223 lb 5.2 oz 230 lb  Weight (kg) 98.7 kg 101.3 kg 104.327 kg      Telemetry    NSR with HR 80s - Personally Reviewed  ECG    NSR without significant ST-T wave changes - Personally Reviewed  Physical Exam   GEN: No acute distress.   Neck: No JVD Cardiac: RRR, no murmurs, rubs, or gallops.  Respiratory: Clear to auscultation bilaterally. GI: Soft, nontender, non-distended  MS: No edema; No deformity. Neuro:  Nonfocal  Psych: Normal affect   Labs    High Sensitivity Troponin:   Recent Labs  Lab 09/13/19 1044 09/13/19 1233 09/13/19 1652 09/13/19 1927    TROPONINIHS 61* 199* 4,154* 7,535*      Chemistry Recent Labs  Lab 09/13/19 1044 09/13/19 1044 09/13/19 1052 09/14/19 0332 09/15/19 0357  NA 137   < > 138 135 133*  K 4.0   < > 3.9 3.6 3.4*  CL 99   < > 98 101 97*  CO2 25  --   --  23 23  GLUCOSE 347*   < > 348* 244* 178*  BUN 8   < > 8 9 9   CREATININE 1.07   < > 1.10 1.19 1.18  CALCIUM 9.7  --   --  9.4 9.5  PROT 8.5*  --   --   --   --   ALBUMIN 4.3  --   --   --   --   AST 20  --   --   --   --   ALT 25  --   --   --   --   ALKPHOS 104  --   --   --   --   BILITOT 0.7  --   --   --   --   GFRNONAA >60  --   --  >60 >60  GFRAA >60  --   --  >  60 >60  ANIONGAP 13  --   --  11 13   < > = values in this interval not displayed.     Hematology Recent Labs  Lab 09/13/19 1044 09/13/19 1044 09/13/19 1052 09/14/19 0332 09/15/19 0357  WBC 10.5  --   --  10.5 11.0*  RBC 4.88  --   --  4.53 4.45  HGB 15.8   < > 16.0 14.5 14.2  HCT 44.7   < > 47.0 40.4 40.3  MCV 91.6  --   --  89.2 90.6  MCH 32.4  --   --  32.0 31.9  MCHC 35.3  --   --  35.9 35.2  RDW 12.5  --   --  12.5 12.6  PLT 306  --   --  286 286   < > = values in this interval not displayed.    BNPNo results for input(s): BNP, PROBNP in the last 168 hours.   DDimer No results for input(s): DDIMER in the last 168 hours.   Radiology    DG Chest Port 1 View  Result Date: 09/13/2019 CLINICAL DATA:  44-year-old male with a history chest pain EXAM: PORTABLE CHEST 1 VIEW COMPARISON:  02/22/2018 FINDINGS: Pain cardiomediastinal silhouette unchanged in size and contour. No evidence of central vascular congestion. No pneumothorax or pleural effusion. No confluent airspace disease. No displaced fracture IMPRESSION: Negative for acute cardiopulmonary disease Electronically Signed   By: Jaime  Wagner D.O.   On: 09/13/2019 11:13   ECHOCARDIOGRAM COMPLETE  Result Date: 09/14/2019    ECHOCARDIOGRAM REPORT   Patient Name:   Kirk Barker Date of Exam: 09/14/2019 Medical Rec  #:  [id number]    Height:       72.0 in Accession #:    [id number]   Weight:       223.3 lb Date of Birth:  06/02/1975     BSA:          2.233 m Patient Age:    44 years     BP:           152/90 mmHg Patient Gender: M            HR:           87 bpm. Exam Location:  Inpatient Procedure: 2D Echo, Cardiac Doppler and Color Doppler Indications:    Chest Pain 786.50 / R07.9  History:        Patient has prior history of Echocardiogram examinations, most                 recent 09/13/2019. Previous Myocardial Infarction; Risk                 Factors:Hypertension, Diabetes, Dyslipidemia and Current Smoker.  Sonographer:    Bernard White RCS Referring Phys: [id number] CADENCE H FURTH IMPRESSIONS  1. Left ventricular ejection fraction, by estimation, is 40 to 45%. The left ventricle has mildly decreased function. The left ventricle demonstrates regional wall motion abnormalities. Global hypokinesis, appears worse in lateral wall. There is moderate left ventricular hypertrophy. Left ventricular diastolic parameters are consistent with Grade I diastolic dysfunction (impaired relaxation).  2. Right ventricular systolic function is normal. The right ventricular size is normal. Tricuspid regurgitation signal is inadequate for assessing PA pressure.  3. The mitral valve is normal in structure. No evidence of mitral valve regurgitation.  4. The aortic valve is tricuspid. Aortic valve regurgitation is not visualized. No aortic stenosis is present.  5. The inferior   vena cava is normal in size with greater than 50% respiratory variability, suggesting right atrial pressure of 3 mmHg.  6. Compared to prior study on 09/13/19, no significant change FINDINGS  Left Ventricle: Left ventricular ejection fraction, by estimation, is 40 to 45%. The left ventricle has mildly decreased function. The left ventricle demonstrates regional wall motion abnormalities. The left ventricular internal cavity size was normal in size. There is moderate left  ventricular hypertrophy. Left ventricular diastolic parameters are consistent with Grade I diastolic dysfunction (impaired relaxation). Right Ventricle: The right ventricular size is normal. No increase in right ventricular wall thickness. Right ventricular systolic function is normal. Tricuspid regurgitation signal is inadequate for assessing PA pressure. Left Atrium: Left atrial size was normal in size. Right Atrium: Right atrial size was normal in size. Pericardium: There is no evidence of pericardial effusion. Mitral Valve: The mitral valve is normal in structure. No evidence of mitral valve regurgitation. Tricuspid Valve: The tricuspid valve is normal in structure. Tricuspid valve regurgitation is not demonstrated. Aortic Valve: The aortic valve is tricuspid. Aortic valve regurgitation is not visualized. No aortic stenosis is present. Pulmonic Valve: The pulmonic valve was not well visualized. Pulmonic valve regurgitation is not visualized. Aorta: The aortic root is normal in size and structure. Venous: The inferior vena cava is normal in size with greater than 50% respiratory variability, suggesting right atrial pressure of 3 mmHg. IAS/Shunts: The interatrial septum was not well visualized.  LEFT VENTRICLE PLAX 2D LVIDd:         4.90 cm      Diastology LVIDs:         3.20 cm      LV e' lateral:   5.98 cm/s LV PW:         1.40 cm      LV E/e' lateral: 9.1 LV IVS:        1.20 cm      LV e' medial:    4.57 cm/s LVOT diam:     1.90 cm      LV E/e' medial:  11.9 LV SV:         42 LV SV Index:   19 LVOT Area:     2.84 cm  LV Volumes (MOD) LV vol d, MOD A2C: 105.0 ml LV vol d, MOD A4C: 115.5 ml LV vol s, MOD A2C: 43.8 ml LV vol s, MOD A4C: 61.9 ml LV SV MOD A2C:     61.2 ml LV SV MOD A4C:     115.5 ml LV SV MOD BP:      54.4 ml RIGHT VENTRICLE RV S prime:     12.10 cm/s TAPSE (M-mode): 1.9 cm LEFT ATRIUM           Index       RIGHT ATRIUM           Index LA diam:      3.20 cm 1.43 cm/m  RA Area:     13.20 cm LA  Vol (A2C): 51.4 ml 23.02 ml/m RA Volume:   30.40 ml  13.62 ml/m LA Vol (A4C): 43.2 ml 19.35 ml/m  AORTIC VALVE LVOT Vmax:   86.70 cm/s LVOT Vmean:  56.600 cm/s LVOT VTI:    0.147 m  AORTA Ao Root diam: 2.90 cm MITRAL VALVE MV Area (PHT): 2.76 cm    SHUNTS MV Decel Time: 275 msec    Systemic VTI:  0.15 m MV E velocity: 54.50 cm/s  Systemic Diam: 1.90 cm MV A velocity:  89.50 cm/s MV E/A ratio:  0.61 Epifanio Lescheshristopher Schumann MD Electronically signed by Epifanio Lescheshristopher Schumann MD Signature Date/Time: 09/14/2019/8:37:39 PM    Final    CT Angio Chest/Abd/Pel for Dissection W and/or W/WO  Result Date: 09/13/2019 CLINICAL DATA:  Acute chest pain beginning at 7 a.m. today. Abnormal EKG. The pain is located anteriorly on the left. Clinical concern for EXAM: CT ANGIOGRAPHY CHEST, ABDOMEN AND PELVIS TECHNIQUE: Non-contrast CT of the chest was initially obtained. Multidetector CT imaging through the chest, abdomen and pelvis was performed using the standard protocol during bolus administration of intravenous contrast. Multiplanar reconstructed images and MIPs were obtained and reviewed to evaluate the vascular anatomy. CONTRAST:  100mL OMNIPAQUE IOHEXOL 350 MG/ML SOLN COMPARISON:  06/17/2017 FINDINGS: CTA CHEST FINDINGS Cardiovascular: Preferential opacification of the thoracic aorta. No evidence of thoracic aortic aneurysm or dissection. No pulmonary emboli seen. Normal heart size. No pericardial effusion. Mediastinum/Nodes: No enlarged mediastinal, hilar, or axillary lymph nodes. Thyroid gland, trachea, and esophagus demonstrate no significant findings. Lungs/Pleura: Stable scarring at the medial right lung base. No pleural fluid or pneumothorax. Musculoskeletal: Mild thoracic and lower cervical spine degenerative changes. Review of the MIP images confirms the above findings. CTA ABDOMEN AND PELVIS FINDINGS VASCULAR Aorta: Minimal atheromatous calcifications distally. No aneurysm or dissection. Celiac: Patent without evidence  of aneurysm, dissection, vasculitis or significant stenosis. SMA: Patent without evidence of aneurysm, dissection, vasculitis or significant stenosis. Renals: Both renal arteries are patent without evidence of aneurysm, dissection, vasculitis, fibromuscular dysplasia or significant stenosis. IMA: Patent without evidence of aneurysm, dissection, vasculitis or significant stenosis. Inflow: Mild bilateral internal iliac artery calcifications. No aneurysm, dissection or stenosis. Veins: No obvious venous abnormality within the limitations of this arterial phase study. Review of the MIP images confirms the above findings. NON-VASCULAR Hepatobiliary: No focal liver abnormality is seen. Status post cholecystectomy. No biliary dilatation. Pancreas: Unremarkable. No pancreatic ductal dilatation or surrounding inflammatory changes. Spleen: Normal in size without focal abnormality. Adrenals/Urinary Tract: Adrenal glands are unremarkable. Kidneys are normal, without renal calculi, focal lesion, or hydronephrosis. Bladder is unremarkable. Stomach/Bowel: Stomach is within normal limits. Appendix appears normal. No evidence of bowel wall thickening, distention, or inflammatory changes. Lymphatic: No enlarged lymph nodes. Reproductive: Prostate is unremarkable. Other: No abdominal wall hernia or abnormality. No abdominopelvic ascites. Musculoskeletal: Mild lumbar spine degenerative changes. Review of the MIP images confirms the above findings. IMPRESSION: 1. No aortic dissection or aneurysm. 2. Minimal calcified aortic atherosclerosis. 3. No pulmonary emboli. 4. No acute abnormality in the chest, abdomen or pelvis. Aortic Atherosclerosis (ICD10-I70.0). Electronically Signed   By: Beckie SaltsSteven  Reid M.D.   On: 09/13/2019 11:51   ECHOCARDIOGRAM LIMITED  Result Date: 09/13/2019    ECHOCARDIOGRAM LIMITED REPORT   Patient Name:   Kirk Barker Date of Exam: 09/13/2019 Medical Rec #:  [id number]    Height:       72.0 in Accession #:     [id number]   Weight:       230.0 lb Date of Birth:  02/12/1976     BSA:          2.261 m Patient Age:    44 years     BP:           162/111 mmHg Patient Gender: M            HR:           74 bpm. Exam Location:  Inpatient Procedure: Limited Echo and Color Doppler Indications:    Chest Pain 786.50 /  R07.9  History:        Patient has no prior history of Echocardiogram examinations.                 Acute MI.  Sonographer:    Ross Ludwig RDCS (AE) Referring Phys: [id number] BRIDGETTE CHRISTOPHER IMPRESSIONS  1. Left ventricular ejection fraction, by estimation, is 60 to 65%. The left ventricle has normal function. The left ventricle demonstrates regional wall motion abnormalities (see scoring diagram/findings for description). There is mild concentric left ventricular hypertrophy. Left ventricular diastolic function could not be evaluated. There is hypokinesis of the left ventricular, entire inferolateral wall and inferior wall.  2. Right ventricular systolic function was not well visualized. The right ventricular size is normal.  3. The mitral valve is normal in structure. Trivial mitral valve regurgitation. No evidence of mitral stenosis.  4. The aortic valve was not assessed. FINDINGS  Left Ventricle: Left ventricular ejection fraction, by estimation, is 60 to 65%. The left ventricle has normal function. The left ventricle demonstrates regional wall motion abnormalities. The left ventricular internal cavity size was normal in size. There is mild concentric left ventricular hypertrophy. Right Ventricle: The right ventricular size is normal. No increase in right ventricular wall thickness. Right ventricular systolic function was not well visualized. Left Atrium: Left atrial size was not assessed. Right Atrium: Right atrial size was not assessed. Pericardium: There is no evidence of pericardial effusion. Mitral Valve: The mitral valve is normal in structure. Normal mobility of the mitral valve leaflets. Trivial mitral valve  regurgitation. No evidence of mitral valve stenosis. Tricuspid Valve: The tricuspid valve is normal in structure. Aortic Valve: The aortic valve was not assessed. Pulmonic Valve: The pulmonic valve was not well visualized. Aorta: The aortic root was not well visualized. Venous: The inferior vena cava was not well visualized. IAS/Shunts: No atrial level shunt detected by color flow Doppler.  LEFT VENTRICLE PLAX 2D LVIDd:         5.00 cm LVIDs:         4.10 cm LV PW:         1.40 cm LV IVS:        1.20 cm  LV Volumes (MOD) LV vol d, MOD A2C: 112.0 ml LV vol d, MOD A4C: 118.0 ml LV vol s, MOD A2C: 63.8 ml LV vol s, MOD A4C: 71.2 ml LV SV MOD A2C:     48.2 ml LV SV MOD A4C:     118.0 ml LV SV MOD BP:      47.4 ml LEFT ATRIUM         Index LA diam:    3.00 cm 1.33 cm/m   AORTA Ao Root diam: 3.50 cm Armanda Magic MD Electronically signed by Armanda Magic MD Signature Date/Time: 09/13/2019/7:26:44 PM    Final     Cardiac Studies   Echo 09/14/2019 1. Left ventricular ejection fraction, by estimation, is 40 to 45%. The  left ventricle has mildly decreased function. The left ventricle  demonstrates regional wall motion abnormalities. Global hypokinesis,  appears worse in lateral wall. There is  moderate left ventricular hypertrophy. Left ventricular diastolic  parameters are consistent with Grade I diastolic dysfunction (impaired  relaxation).  2. Right ventricular systolic function is normal. The right ventricular  size is normal. Tricuspid regurgitation signal is inadequate for assessing  PA pressure.  3. The mitral valve is normal in structure. No evidence of mitral valve  regurgitation.  4. The aortic valve is tricuspid. Aortic valve  regurgitation is not  visualized. No aortic stenosis is present.  5. The inferior vena cava is normal in size with greater than 50%  respiratory variability, suggesting right atrial pressure of 3 mmHg.  6. Compared to prior study on 09/13/19, no significant change    Patient Profile     44 y.o. male with PMH of DM II, chronic epigastric pain, pancreatitis, tobacco use and median arcuate ligament syndrome who presented with chest pain and was ruled in for NSTEMI by enzyme.   Assessment & Plan    1. NSTEMI  - Echo 09/14/2019 showed EF 40-45%, global hypokinesis worse in the lateral wall, grade 1 DD  - troponin peaked on Saturday at 7535.    - verified with cath lab, currently on the add-on board for cath this afternoon  - Risk and benefit of procedure explained to the patient who display clear understanding and agree to proceed. Discussed with patient possible procedural risk include bleeding, vascular injury, renal injury, arrythmia, MI, stroke and loss of limb or life.  2. HTN: controlled on coreg.   3. HLD: Continue on high intensity statin lipitor 80mg   4. DM II: not on any home med for this. Likely start on SGLT2 and followup with PCP to consider insulin  5. Chronic epigastric pain  6. Tobacco use: tobacco cessation imperative  7. Headache: associated with IV nitro, getting oxycodone every 3 hours and acetaminophen.  For questions or updates, please contact [place] Please consult [email or web address] for contact info under     Signed, Almyra Deforest, [place]  09/15/2019, 9:13 AM     The patient was seen, examined and discussed with Almyra Deforest, PA-C and I agree with the above.   The patient denies any chest pain today.  He seems to be comfortable.  His echocardiogram yesterday showed new wall motion abnormalities and decreased LVEF 40 to 45%.  He has significant risk factors including hypertension hyperlipidemia and diabetes as well as smoking.  His creatinine as high as 7500, attentiveness normal, 1.19, hemoglobin 14.2, platelets 286.  We will continue IV heparin till cath this afternoon, continue aspirin, high-dose atorvastatin and carvedilol, add low-dose losartan.  Ena Dawley, MD 09/15/2019

## 2019-09-15 NOTE — H&P (View-Only) (Signed)
Progress Note  Patient Name: Kirk Barker Date of Encounter: 09/15/2019  Primary Cardiologist: Jodelle Red, MD   Subjective   Denies any chest pain, continue to have severe headache.   Inpatient Medications    Scheduled Meds: . acetaminophen  650 mg Oral Q6H  . aspirin EC  81 mg Oral Daily  . atorvastatin  80 mg Oral q1800  . carvedilol  12.5 mg Oral BID WC  . insulin aspart  0-15 Units Subcutaneous TID WC  . insulin aspart  0-5 Units Subcutaneous QHS  . pantoprazole  40 mg Oral Daily  . sodium chloride flush  3 mL Intravenous Q12H   Continuous Infusions: . sodium chloride    . heparin 1,200 Units/hr (09/15/19 3546)  . nitroGLYCERIN 15 mcg/min (09/15/19 0015)   PRN Meds: sodium chloride, ALPRAZolam, nitroGLYCERIN, ondansetron (ZOFRAN) IV, oxyCODONE, sodium chloride flush, zolpidem   Vital Signs    Vitals:   09/14/19 2100 09/15/19 0500 09/15/19 0558 09/15/19 0804  BP: 127/72  (!) 150/83 (!) 144/83  Pulse: 85  82   Resp: 19  20   Temp: 98 F (36.7 C)  98.8 F (37.1 C)   TempSrc: Oral     SpO2: 99%  100%   Weight:  98.7 kg    Height:        Intake/Output Summary (Last 24 hours) at 09/15/2019 0913 Last data filed at 09/15/2019 0015 Gross per 24 hour  Intake 974.19 ml  Output 300 ml  Net 674.19 ml   Last 3 Weights 09/15/2019 09/14/2019 09/13/2019  Weight (lbs) 217 lb 9.5 oz 223 lb 5.2 oz 230 lb  Weight (kg) 98.7 kg 101.3 kg 104.327 kg      Telemetry    NSR with HR 80s - Personally Reviewed  ECG    NSR without significant ST-T wave changes - Personally Reviewed  Physical Exam   GEN: No acute distress.   Neck: No JVD Cardiac: RRR, no murmurs, rubs, or gallops.  Respiratory: Clear to auscultation bilaterally. GI: Soft, nontender, non-distended  MS: No edema; No deformity. Neuro:  Nonfocal  Psych: Normal affect   Labs    High Sensitivity Troponin:   Recent Labs  Lab 09/13/19 1044 09/13/19 1233 09/13/19 1652 09/13/19 1927    TROPONINIHS 61* 199* 4,154* 7,535*      Chemistry Recent Labs  Lab 09/13/19 1044 09/13/19 1044 09/13/19 1052 09/14/19 0332 09/15/19 0357  NA 137   < > 138 135 133*  K 4.0   < > 3.9 3.6 3.4*  CL 99   < > 98 101 97*  CO2 25  --   --  23 23  GLUCOSE 347*   < > 348* 244* 178*  BUN 8   < > 8 9 9   CREATININE 1.07   < > 1.10 1.19 1.18  CALCIUM 9.7  --   --  9.4 9.5  PROT 8.5*  --   --   --   --   ALBUMIN 4.3  --   --   --   --   AST 20  --   --   --   --   ALT 25  --   --   --   --   ALKPHOS 104  --   --   --   --   BILITOT 0.7  --   --   --   --   GFRNONAA >60  --   --  >60 >60  GFRAA >60  --   --  >  60 >60  ANIONGAP 13  --   --  11 13   < > = values in this interval not displayed.     Hematology Recent Labs  Lab 09/13/19 1044 09/13/19 1044 09/13/19 1052 09/14/19 0332 09/15/19 0357  WBC 10.5  --   --  10.5 11.0*  RBC 4.88  --   --  4.53 4.45  HGB 15.8   < > 16.0 14.5 14.2  HCT 44.7   < > 47.0 40.4 40.3  MCV 91.6  --   --  89.2 90.6  MCH 32.4  --   --  32.0 31.9  MCHC 35.3  --   --  35.9 35.2  RDW 12.5  --   --  12.5 12.6  PLT 306  --   --  286 286   < > = values in this interval not displayed.    BNPNo results for input(s): BNP, PROBNP in the last 168 hours.   DDimer No results for input(s): DDIMER in the last 168 hours.   Radiology    DG Chest Port 1 View  Result Date: 09/13/2019 CLINICAL DATA:  44 year old male with a history chest pain EXAM: PORTABLE CHEST 1 VIEW COMPARISON:  02/22/2018 FINDINGS: Pain cardiomediastinal silhouette unchanged in size and contour. No evidence of central vascular congestion. No pneumothorax or pleural effusion. No confluent airspace disease. No displaced fracture IMPRESSION: Negative for acute cardiopulmonary disease Electronically Signed   By: Gilmer Mor D.O.   On: 09/13/2019 11:13   ECHOCARDIOGRAM COMPLETE  Result Date: 09/14/2019    ECHOCARDIOGRAM REPORT   Patient Name:   AZAM GERVASI Date of Exam: 09/14/2019 Medical Rec  #:  [id number]    Height:       72.0 in Accession #:    [id number]   Weight:       223.3 lb Date of Birth:  1975-11-22     BSA:          2.233 m Patient Age:    44 years     BP:           152/90 mmHg Patient Gender: M            HR:           87 bpm. Exam Location:  Inpatient Procedure: 2D Echo, Cardiac Doppler and Color Doppler Indications:    Chest Pain 786.50 / R07.9  History:        Patient has prior history of Echocardiogram examinations, most                 recent 09/13/2019. Previous Myocardial Infarction; Risk                 Factors:Hypertension, Diabetes, Dyslipidemia and Current Smoker.  Sonographer:    Celesta Gentile RCS Referring Phys: [id number] CADENCE H FURTH IMPRESSIONS  1. Left ventricular ejection fraction, by estimation, is 40 to 45%. The left ventricle has mildly decreased function. The left ventricle demonstrates regional wall motion abnormalities. Global hypokinesis, appears worse in lateral wall. There is moderate left ventricular hypertrophy. Left ventricular diastolic parameters are consistent with Grade I diastolic dysfunction (impaired relaxation).  2. Right ventricular systolic function is normal. The right ventricular size is normal. Tricuspid regurgitation signal is inadequate for assessing PA pressure.  3. The mitral valve is normal in structure. No evidence of mitral valve regurgitation.  4. The aortic valve is tricuspid. Aortic valve regurgitation is not visualized. No aortic stenosis is present.  5. The inferior  vena cava is normal in size with greater than 50% respiratory variability, suggesting right atrial pressure of 3 mmHg.  6. Compared to prior study on 09/13/19, no significant change FINDINGS  Left Ventricle: Left ventricular ejection fraction, by estimation, is 40 to 45%. The left ventricle has mildly decreased function. The left ventricle demonstrates regional wall motion abnormalities. The left ventricular internal cavity size was normal in size. There is moderate left  ventricular hypertrophy. Left ventricular diastolic parameters are consistent with Grade I diastolic dysfunction (impaired relaxation). Right Ventricle: The right ventricular size is normal. No increase in right ventricular wall thickness. Right ventricular systolic function is normal. Tricuspid regurgitation signal is inadequate for assessing PA pressure. Left Atrium: Left atrial size was normal in size. Right Atrium: Right atrial size was normal in size. Pericardium: There is no evidence of pericardial effusion. Mitral Valve: The mitral valve is normal in structure. No evidence of mitral valve regurgitation. Tricuspid Valve: The tricuspid valve is normal in structure. Tricuspid valve regurgitation is not demonstrated. Aortic Valve: The aortic valve is tricuspid. Aortic valve regurgitation is not visualized. No aortic stenosis is present. Pulmonic Valve: The pulmonic valve was not well visualized. Pulmonic valve regurgitation is not visualized. Aorta: The aortic root is normal in size and structure. Venous: The inferior vena cava is normal in size with greater than 50% respiratory variability, suggesting right atrial pressure of 3 mmHg. IAS/Shunts: The interatrial septum was not well visualized.  LEFT VENTRICLE PLAX 2D LVIDd:         4.90 cm      Diastology LVIDs:         3.20 cm      LV e' lateral:   5.98 cm/s LV PW:         1.40 cm      LV E/e' lateral: 9.1 LV IVS:        1.20 cm      LV e' medial:    4.57 cm/s LVOT diam:     1.90 cm      LV E/e' medial:  11.9 LV SV:         42 LV SV Index:   19 LVOT Area:     2.84 cm  LV Volumes (MOD) LV vol d, MOD A2C: 105.0 ml LV vol d, MOD A4C: 115.5 ml LV vol s, MOD A2C: 43.8 ml LV vol s, MOD A4C: 61.9 ml LV SV MOD A2C:     61.2 ml LV SV MOD A4C:     115.5 ml LV SV MOD BP:      54.4 ml RIGHT VENTRICLE RV S prime:     12.10 cm/s TAPSE (M-mode): 1.9 cm LEFT ATRIUM           Index       RIGHT ATRIUM           Index LA diam:      3.20 cm 1.43 cm/m  RA Area:     13.20 cm LA  Vol (A2C): 51.4 ml 23.02 ml/m RA Volume:   30.40 ml  13.62 ml/m LA Vol (A4C): 43.2 ml 19.35 ml/m  AORTIC VALVE LVOT Vmax:   86.70 cm/s LVOT Vmean:  56.600 cm/s LVOT VTI:    0.147 m  AORTA Ao Root diam: 2.90 cm MITRAL VALVE MV Area (PHT): 2.76 cm    SHUNTS MV Decel Time: 275 msec    Systemic VTI:  0.15 m MV E velocity: 54.50 cm/s  Systemic Diam: 1.90 cm MV A velocity:  89.50 cm/s MV E/A ratio:  0.61 Epifanio Lescheshristopher Schumann MD Electronically signed by Epifanio Lescheshristopher Schumann MD Signature Date/Time: 09/14/2019/8:37:39 PM    Final    CT Angio Chest/Abd/Pel for Dissection W and/or W/WO  Result Date: 09/13/2019 CLINICAL DATA:  Acute chest pain beginning at 7 a.m. today. Abnormal EKG. The pain is located anteriorly on the left. Clinical concern for EXAM: CT ANGIOGRAPHY CHEST, ABDOMEN AND PELVIS TECHNIQUE: Non-contrast CT of the chest was initially obtained. Multidetector CT imaging through the chest, abdomen and pelvis was performed using the standard protocol during bolus administration of intravenous contrast. Multiplanar reconstructed images and MIPs were obtained and reviewed to evaluate the vascular anatomy. CONTRAST:  100mL OMNIPAQUE IOHEXOL 350 MG/ML SOLN COMPARISON:  06/17/2017 FINDINGS: CTA CHEST FINDINGS Cardiovascular: Preferential opacification of the thoracic aorta. No evidence of thoracic aortic aneurysm or dissection. No pulmonary emboli seen. Normal heart size. No pericardial effusion. Mediastinum/Nodes: No enlarged mediastinal, hilar, or axillary lymph nodes. Thyroid gland, trachea, and esophagus demonstrate no significant findings. Lungs/Pleura: Stable scarring at the medial right lung base. No pleural fluid or pneumothorax. Musculoskeletal: Mild thoracic and lower cervical spine degenerative changes. Review of the MIP images confirms the above findings. CTA ABDOMEN AND PELVIS FINDINGS VASCULAR Aorta: Minimal atheromatous calcifications distally. No aneurysm or dissection. Celiac: Patent without evidence  of aneurysm, dissection, vasculitis or significant stenosis. SMA: Patent without evidence of aneurysm, dissection, vasculitis or significant stenosis. Renals: Both renal arteries are patent without evidence of aneurysm, dissection, vasculitis, fibromuscular dysplasia or significant stenosis. IMA: Patent without evidence of aneurysm, dissection, vasculitis or significant stenosis. Inflow: Mild bilateral internal iliac artery calcifications. No aneurysm, dissection or stenosis. Veins: No obvious venous abnormality within the limitations of this arterial phase study. Review of the MIP images confirms the above findings. NON-VASCULAR Hepatobiliary: No focal liver abnormality is seen. Status post cholecystectomy. No biliary dilatation. Pancreas: Unremarkable. No pancreatic ductal dilatation or surrounding inflammatory changes. Spleen: Normal in size without focal abnormality. Adrenals/Urinary Tract: Adrenal glands are unremarkable. Kidneys are normal, without renal calculi, focal lesion, or hydronephrosis. Bladder is unremarkable. Stomach/Bowel: Stomach is within normal limits. Appendix appears normal. No evidence of bowel wall thickening, distention, or inflammatory changes. Lymphatic: No enlarged lymph nodes. Reproductive: Prostate is unremarkable. Other: No abdominal wall hernia or abnormality. No abdominopelvic ascites. Musculoskeletal: Mild lumbar spine degenerative changes. Review of the MIP images confirms the above findings. IMPRESSION: 1. No aortic dissection or aneurysm. 2. Minimal calcified aortic atherosclerosis. 3. No pulmonary emboli. 4. No acute abnormality in the chest, abdomen or pelvis. Aortic Atherosclerosis (ICD10-I70.0). Electronically Signed   By: Beckie SaltsSteven  Reid M.D.   On: 09/13/2019 11:51   ECHOCARDIOGRAM LIMITED  Result Date: 09/13/2019    ECHOCARDIOGRAM LIMITED REPORT   Patient Name:   Patsy BaltimoreDAMIEN Mcleary Date of Exam: 09/13/2019 Medical Rec #:  [id number]    Height:       72.0 in Accession #:     [id number]   Weight:       230.0 lb Date of Birth:  02/12/1976     BSA:          2.261 m Patient Age:    44 years     BP:           162/111 mmHg Patient Gender: M            HR:           74 bpm. Exam Location:  Inpatient Procedure: Limited Echo and Color Doppler Indications:    Chest Pain 786.50 /  R07.9  History:        Patient has no prior history of Echocardiogram examinations.                 Acute MI.  Sonographer:    Ross Ludwig RDCS (AE) Referring Phys: [id number] BRIDGETTE CHRISTOPHER IMPRESSIONS  1. Left ventricular ejection fraction, by estimation, is 60 to 65%. The left ventricle has normal function. The left ventricle demonstrates regional wall motion abnormalities (see scoring diagram/findings for description). There is mild concentric left ventricular hypertrophy. Left ventricular diastolic function could not be evaluated. There is hypokinesis of the left ventricular, entire inferolateral wall and inferior wall.  2. Right ventricular systolic function was not well visualized. The right ventricular size is normal.  3. The mitral valve is normal in structure. Trivial mitral valve regurgitation. No evidence of mitral stenosis.  4. The aortic valve was not assessed. FINDINGS  Left Ventricle: Left ventricular ejection fraction, by estimation, is 60 to 65%. The left ventricle has normal function. The left ventricle demonstrates regional wall motion abnormalities. The left ventricular internal cavity size was normal in size. There is mild concentric left ventricular hypertrophy. Right Ventricle: The right ventricular size is normal. No increase in right ventricular wall thickness. Right ventricular systolic function was not well visualized. Left Atrium: Left atrial size was not assessed. Right Atrium: Right atrial size was not assessed. Pericardium: There is no evidence of pericardial effusion. Mitral Valve: The mitral valve is normal in structure. Normal mobility of the mitral valve leaflets. Trivial mitral valve  regurgitation. No evidence of mitral valve stenosis. Tricuspid Valve: The tricuspid valve is normal in structure. Aortic Valve: The aortic valve was not assessed. Pulmonic Valve: The pulmonic valve was not well visualized. Aorta: The aortic root was not well visualized. Venous: The inferior vena cava was not well visualized. IAS/Shunts: No atrial level shunt detected by color flow Doppler.  LEFT VENTRICLE PLAX 2D LVIDd:         5.00 cm LVIDs:         4.10 cm LV PW:         1.40 cm LV IVS:        1.20 cm  LV Volumes (MOD) LV vol d, MOD A2C: 112.0 ml LV vol d, MOD A4C: 118.0 ml LV vol s, MOD A2C: 63.8 ml LV vol s, MOD A4C: 71.2 ml LV SV MOD A2C:     48.2 ml LV SV MOD A4C:     118.0 ml LV SV MOD BP:      47.4 ml LEFT ATRIUM         Index LA diam:    3.00 cm 1.33 cm/m   AORTA Ao Root diam: 3.50 cm Armanda Magic MD Electronically signed by Armanda Magic MD Signature Date/Time: 09/13/2019/7:26:44 PM    Final     Cardiac Studies   Echo 09/14/2019 1. Left ventricular ejection fraction, by estimation, is 40 to 45%. The  left ventricle has mildly decreased function. The left ventricle  demonstrates regional wall motion abnormalities. Global hypokinesis,  appears worse in lateral wall. There is  moderate left ventricular hypertrophy. Left ventricular diastolic  parameters are consistent with Grade I diastolic dysfunction (impaired  relaxation).  2. Right ventricular systolic function is normal. The right ventricular  size is normal. Tricuspid regurgitation signal is inadequate for assessing  PA pressure.  3. The mitral valve is normal in structure. No evidence of mitral valve  regurgitation.  4. The aortic valve is tricuspid. Aortic valve  regurgitation is not  visualized. No aortic stenosis is present.  5. The inferior vena cava is normal in size with greater than 50%  respiratory variability, suggesting right atrial pressure of 3 mmHg.  6. Compared to prior study on 09/13/19, no significant change    Patient Profile     44 y.o. male with PMH of DM II, chronic epigastric pain, pancreatitis, tobacco use and median arcuate ligament syndrome who presented with chest pain and was ruled in for NSTEMI by enzyme.   Assessment & Plan    1. NSTEMI  - Echo 09/14/2019 showed EF 40-45%, global hypokinesis worse in the lateral wall, grade 1 DD  - troponin peaked on Saturday at 7535.    - verified with cath lab, currently on the add-on board for cath this afternoon  - Risk and benefit of procedure explained to the patient who display clear understanding and agree to proceed. Discussed with patient possible procedural risk include bleeding, vascular injury, renal injury, arrythmia, MI, stroke and loss of limb or life.  2. HTN: controlled on coreg.   3. HLD: Continue on high intensity statin lipitor 80mg   4. DM II: not on any home med for this. Likely start on SGLT2 and followup with PCP to consider insulin  5. Chronic epigastric pain  6. Tobacco use: tobacco cessation imperative  7. Headache: associated with IV nitro, getting oxycodone every 3 hours and acetaminophen.  For questions or updates, please contact [place] Please consult [email or web address] for contact info under     Signed, Almyra Deforest, [place]  09/15/2019, 9:13 AM     The patient was seen, examined and discussed with Almyra Deforest, PA-C and I agree with the above.   The patient denies any chest pain today.  He seems to be comfortable.  His echocardiogram yesterday showed new wall motion abnormalities and decreased LVEF 40 to 45%.  He has significant risk factors including hypertension hyperlipidemia and diabetes as well as smoking.  His creatinine as high as 7500, attentiveness normal, 1.19, hemoglobin 14.2, platelets 286.  We will continue IV heparin till cath this afternoon, continue aspirin, high-dose atorvastatin and carvedilol, add low-dose losartan.  Ena Dawley, MD 09/15/2019

## 2019-09-16 DIAGNOSIS — I25110 Atherosclerotic heart disease of native coronary artery with unstable angina pectoris: Secondary | ICD-10-CM

## 2019-09-16 DIAGNOSIS — I214 Non-ST elevation (NSTEMI) myocardial infarction: Secondary | ICD-10-CM | POA: Diagnosis not present

## 2019-09-16 DIAGNOSIS — I10 Essential (primary) hypertension: Secondary | ICD-10-CM | POA: Diagnosis not present

## 2019-09-16 DIAGNOSIS — I2510 Atherosclerotic heart disease of native coronary artery without angina pectoris: Secondary | ICD-10-CM

## 2019-09-16 DIAGNOSIS — E7800 Pure hypercholesterolemia, unspecified: Secondary | ICD-10-CM | POA: Diagnosis not present

## 2019-09-16 LAB — BASIC METABOLIC PANEL
Anion gap: 12 (ref 5–15)
BUN: 8 mg/dL (ref 6–20)
CO2: 19 mmol/L — ABNORMAL LOW (ref 22–32)
Calcium: 9.1 mg/dL (ref 8.9–10.3)
Chloride: 101 mmol/L (ref 98–111)
Creatinine, Ser: 1 mg/dL (ref 0.61–1.24)
GFR calc Af Amer: >60 mL/min (ref >60)
GFR calc non Af Amer: >60 mL/min (ref >60)
Glucose, Bld: 242 mg/dL — ABNORMAL HIGH (ref 70–99)
Potassium: 3.6 mmol/L (ref 3.5–5.1)
Sodium: 132 mmol/L — ABNORMAL LOW (ref 135–145)

## 2019-09-16 LAB — CBC
HCT: 41.3 % (ref 39.0–52.0)
Hemoglobin: 14.6 g/dL (ref 13.0–17.0)
MCH: 32.4 pg (ref 26.0–34.0)
MCHC: 35.4 g/dL (ref 30.0–36.0)
MCV: 91.6 fL (ref 80.0–100.0)
Platelets: 262 10*3/uL (ref 150–400)
RBC: 4.51 MIL/uL (ref 4.22–5.81)
RDW: 12.5 % (ref 11.5–15.5)
WBC: 9.8 10*3/uL (ref 4.0–10.5)
nRBC: 0 % (ref 0.0–0.2)

## 2019-09-16 LAB — GLUCOSE, CAPILLARY
Glucose-Capillary: 270 mg/dL — ABNORMAL HIGH (ref 70–99)
Glucose-Capillary: 264 mg/dL — ABNORMAL HIGH (ref 70–99)

## 2019-09-16 MED ORDER — HEART ATTACK BOUNCING BOOK
Freq: Once | Status: AC
  Filled 2019-09-16: qty 1

## 2019-09-16 MED ORDER — CARVEDILOL 25 MG PO TABS: 25.0000 mg | ORAL_TABLET | Freq: Two times a day (BID) | ORAL | 2 refills | Status: DC

## 2019-09-16 MED ORDER — LOSARTAN POTASSIUM 50 MG PO TABS: 50.0000 mg | ORAL_TABLET | Freq: Every day | ORAL | 2 refills | Status: DC

## 2019-09-16 MED ORDER — METFORMIN HCL 500 MG PO TABS: 500.0000 mg | ORAL_TABLET | Freq: Two times a day (BID) | ORAL | 1 refills | Status: DC

## 2019-09-16 MED ORDER — THE SENSUOUS HEART BOOK
Freq: Once | Status: AC
  Filled 2019-09-16: qty 1

## 2019-09-16 MED ORDER — CARVEDILOL 25 MG PO TABS: 25.0000 mg | ORAL_TABLET | Freq: Two times a day (BID) | ORAL | Status: DC

## 2019-09-16 MED ORDER — ASPIRIN 81 MG PO CHEW: 81.0000 mg | CHEWABLE_TABLET | Freq: Every day | ORAL | Status: DC

## 2019-09-16 MED ORDER — FARXIGA 10 MG PO TABS: 10.0000 mg | ORAL_TABLET | Freq: Every day | ORAL | 1 refills | Status: DC

## 2019-09-16 MED ORDER — ANGIOPLASTY BOOK
Freq: Once | Status: AC
  Filled 2019-09-16: qty 1

## 2019-09-16 MED ORDER — ATORVASTATIN CALCIUM 80 MG PO TABS: 80.0000 mg | ORAL_TABLET | Freq: Every day | ORAL | 2 refills | Status: DC

## 2019-09-16 MED ORDER — NITROGLYCERIN 0.4 MG SL SUBL: 0.4000 mg | SUBLINGUAL_TABLET | SUBLINGUAL | 1 refills | Status: DC | PRN

## 2019-09-16 MED ORDER — TICAGRELOR 90 MG PO TABS: 90.0000 mg | ORAL_TABLET | Freq: Two times a day (BID) | ORAL | 11 refills | Status: DC

## 2019-09-16 MED ORDER — PANTOPRAZOLE SODIUM 40 MG PO TBEC: 40.0000 mg | DELAYED_RELEASE_TABLET | Freq: Every day | ORAL | 2 refills | Status: DC

## 2019-09-16 MED FILL — PANTOPRAZOLE SOD DR 40 MG T: 40 | 30 days supply | Qty: 30 | Fill #0

## 2019-09-16 MED FILL — metFORMIN HCL 500 MG TABS: 500 | 30 days supply | Qty: 60 | Fill #0

## 2019-09-16 MED FILL — LOSARTAN POTASSIUM 50 MG TA: 50 | 30 days supply | Qty: 30 | Fill #0

## 2019-09-16 MED FILL — ATORVASTATIN CALCIUM 80 MG: 80 | 30 days supply | Qty: 30 | Fill #0

## 2019-09-16 MED FILL — BRILINTA 90 MG TABLET: 90 | 30 days supply | Qty: 60 | Fill #0

## 2019-09-16 MED FILL — NITROGLYCERIN 0.4 MG TAB SL: 0.4 | 8 days supply | Qty: 25 | Fill #0

## 2019-09-16 MED FILL — CARVEDILOL 25 MG TABLET: 25 | 30 days supply | Qty: 60 | Fill #0

## 2019-09-16 NOTE — TOC Benefit Eligibility Note (Signed)
Transition of Care [facility]) Benefit Eligibility Note    Patient Details  Name: Oneill Bais MRN: [id number] Date of Birth: 07/30/1975   Medication/Dose: BRILINTA  90 MG BID  Covered?: Yes  Tier: 3 Drug  Prescription Coverage [facility], [facility] and [facility]  Spoke with Person/Company/Phone Number:: CAROL  @   [employer] RX # [phone]  Co-Pay: $C 319.30  Prior Approval: No  Deductible: Unmet(OUT-OF-POCKET:UNMET)  Additional Notes: ALTERNATIVE : CLOPIDOGREL 75 MG BID , COVER- YES, CO-PAY- ZERO DOLLARS, TIER-1 DRUG, P/A- NO    Mardene Sayer Phone Number: 09/16/2019, 1:35 PM

## 2019-09-16 NOTE — Discharge Instructions (Signed)
Medication Changes: - Start Aspirin 81mg  daily and Brilinta 90mg  twice daily. Both of these medications help keep the stent in your heart open. - Start Carvedilol (Coreg) 25mg  twice daily. - Start Losartan 50mg  daily.  - Start Lipitor 80mg  daily for your cholesterol.  - Start Farxiga 10mg  daily for your diabetes. - Start Metformin 500mg  twice daily for your diabetes. Start this 48 hours after your cardiac catheterization (evening of 09/17/2019).  - Take sublingual Nitro as needed for chest pain as instructed. - Can take Protonix 40mg  daily (instead of the Omeprazole that you have at home) for your chronic stomach problems.   I spoke with are Case Manager who said that you do not need a referral to [facility]. You can just call their office and schedule a New Patient appointment. Their number is [phone]. You may also want to check with your insurance to make sure that they are in network for you.  Post NSTEMI: NO HEAVY LIFTING X 2 WEEKS. NO SEXUAL ACTIVITY X 2 WEEKS. NO DRIVING DAYS. NO SOAKING BATHS, HOT TUBS, POOLS, ETC., X 7 DAYS.  Radial Site Care: Refer to this sheet in the next few weeks. These instructions provide you with information on caring for yourself after your procedure. Your caregiver may also give you more specific instructions. Your treatment has been planned according to current medical practices, but problems sometimes occur. Call your caregiver if you have any problems or questions after your procedure. HOME CARE INSTRUCTIONS  You may shower the day after the procedure.Remove the bandage (dressing) and gently wash the site with plain soap and water.Gently pat the site dry.   Do not apply powder or lotion to the site.   Do not submerge the affected site in water for 3 to 5 days.   Inspect the site at least twice daily.   Do not flex or bend the affected arm for 24 hours.   No lifting over 5 pounds (2.3 kg) for 5 days after your procedure.   Do  not drive home if you are discharged the same day of the procedure. Have someone else drive you.  What to expect:  Any bruising will usually fade within 1 to 2 weeks.   Blood that collects in the tissue (hematoma) may be painful to the touch. It should usually decrease in size and tenderness within 1 to 2 weeks.  SEEK IMMEDIATE MEDICAL CARE IF:  You have unusual pain at the radial site.   You have redness, warmth, swelling, or pain at the radial site.   You have drainage (other than a small amount of blood on the dressing).   You have chills.   You have a fever or persistent symptoms for more than 72 hours.   You have a fever and your symptoms suddenly get worse.   Your arm becomes pale, cool, tingly, or numb.   You have heavy bleeding from the site. Hold pressure on the site.

## 2019-09-16 NOTE — Discharge Summary (Addendum)
Discharge Summary    [name] MRN: [id number]; DOB: [birth year]  Admit date: 09/13/2019 Discharge date: 09/16/2019  Primary Care Provider: Soundra Pilon, FNP  Primary Cardiologist: Jodelle Red, MD  Primary Electrophysiologist:  None   Discharge Diagnoses    Principal Problem:   NSTEMI (non-ST elevated myocardial infarction) [facility]) Active Problems:   CAD (coronary artery disease)   Hypertension   Type II diabetes mellitus (HCC)   Hypercholesteremia   Tobacco use    Diagnostic Studies/Procedures    Limited Echo 09/13/2019:  Impressions:  1. Left ventricular ejection fraction, by estimation, is 60 to 65%. The  left ventricle has normal function. The left ventricle demonstrates  regional wall motion abnormalities (see scoring diagram/findings for  description). There is mild concentric left ventricular hypertrophy. Left ventricular diastolic function could not be evaluated. There is hypokinesis of the left ventricular, entire  inferolateral wall and inferior wall.  2. Right ventricular systolic function was not well visualized. The right  ventricular size is normal.  3. The mitral valve is normal in structure. Trivial mitral valve  regurgitation. No evidence of mitral stenosis.  4. The aortic valve was not assessed.  _______________  Complete Echo 09/14/2019:  Impressions:  1. Left ventricular ejection fraction, by estimation, is 40 to 45%. The  left ventricle has mildly decreased function. The left ventricle  demonstrates regional wall motion abnormalities. Global hypokinesis,  appears worse in lateral wall. There is  moderate left ventricular hypertrophy. Left ventricular diastolic  parameters are consistent with Grade I diastolic dysfunction (impaired  relaxation).  2. Right ventricular systolic function is normal. The right ventricular  size is normal. Tricuspid regurgitation signal is inadequate for assessing  PA pressure.  3. The  mitral valve is normal in structure. No evidence of mitral valve  regurgitation.  4. The aortic valve is tricuspid. Aortic valve regurgitation is not  visualized. No aortic stenosis is present.  5. The inferior vena cava is normal in size with greater than 50%  respiratory variability, suggesting right atrial pressure of 3 mmHg.  6. Compared to prior study on 09/13/19, no significant change.  _______________  Left Heart Catheterization 09/15/2019:   Non-ST elevation myocardial infarction without EKG changes but abnormal high-sensitivity troponin I ischemic biomarkers.   High-grade, thrombotic obstruction in the equivalent of the ramus intermedius prior to a bifurcation into 2 large branches. The more perpendicular branch contained proximal 40% narrowing with widely patent ostium.   Successful PCI and stent implantation across the perpendicular branch reducing the 99% stenosis to 0% with TIMI grade III flow. The device was a 2.5 x 18 Onyx postdilated to 2.75 mm in the proximal two thirds of the stent.   Widely patent left main   30% proximal LAD with mild to moderate diffuse mid and distal disease.   Widely patent circumflex with minimal luminal irregularities.   Codominant/dominant right coronary with 75% stenosis in the distal one third of the first left ventricular branch.   Mildly reduced to low normal LV systolic function with EF 45 to 50%. No obvious regional wall motion abnormality noted.   Hypertension treated with intracoronary nitroglycerin and 2 doses of IV labetalol totaling 20 mg.    Recommendations:   Preventive therapy: Aggressive lipid-lowering to LDL less than 70. Glycemic control to hemoglobin A1c less than 7. Blood pressure control to 130/80 mmHg or less. Smoking cessation.   I increased losartan to 50 mg/day. May need to increase carvedilol to 25 mg twice daily.  Aspirin and Brilinta x12 months.   Discharge in a.m.   Diagnostic Dominance:  Right  Intervention     History of Present Illness     Kirk Barker is a 44 year old male with a history of undiagnosed type 2 diabetes mellitus, chronic epigastric pain, pancreatitis, GERD, tobacco use, and median arcuate ligament syndrome who presented to the [facility] ED on 09/13/2019 with chest pain. He had no known cardiac history prior to this admission and had never been seen by Cardiology. Per Care Everywhere, patient has a history of type 2 diabetes; however, patient reported he was not taking any medications for this. He is current tobacco user smoking 1/2 pack per day. He denied any alcohol or drug use. He has chronic daily lower chest pain/epigastric pain for the last 5-6 years for which he has undergone multiple imaging studies and EGDs and was ultimately diagnosed with median arcuate ligament syndrome. He does not take any medications for hypertension or hyperlipidemia although electronic health record shows Lopressor and Diltiazem. He reports the only medication he takes in an antacid for GERD. No family history of cardiac disease.    Patient presented to the [facility] ED on 09/13/19 for chest pain. Pain reported progressively worsening chest pain over the last week. The first episode patient had was mid-sternal chest pressure that lasted about 10 minutes and self resolved. A couple days later he had the same pain but it was worse and lasted for a longer time period. On day of presentation, patient was awakened from sleep with severe midsternal chest pain. He describes it as a non-radiating pressure with associated shortness of breath and diaphoresis. No nausea/vomiting. He did not take anything for the pain. Denies fever, chills, recent illness. Pain is different from chronic epigastric/lower chest pain he has had in the past.    In the ED, BP 192/122, pulse 119, afebrile, RR 17, 97% O2. Labs showed glucose 348, potassium 4.0, creatinine 1.07, lipase and LFTS wnl. WBC 10.5, Hgb  15.8. High-sensitivity elevated at troponin 61>199. EKG with some minimal ST elevation inferolateral leads. Chest x-ray unremarkable. CTA chest/abd/pelvis negative for dissection, aneurysm, and PE but did show minimal calcified aortic atherosclerosis. He was given Nitro, Aspirin, and Fentanyl for the pain. He was started on IV Heparin. Chest pain persisted and he was started on IV Nitro. COVID negative. Patient was transferred to [facility] for further work-up.    Upon arrival to [facility], he reported persistent pain and Nitro drip was titrated. He states he had a few hours of being chest pain free after sublingual Nitro while in the [facility] ED. On interview, he appears uncomfortable from the pain. BP also severely elevated which he says happens when he has severe pain.   Hospital Course     Consultants: cardiology   NSTEMI  High-sensitivity peaked at 7,535. Limited Echo on 09/13/2019 showed LVEF of 60-65% with hypokinesis of the entire inferolateral wall and inferior wall. Complete Echo on 09/14/2019 showed  LVEF of 40-45% with global hypokinesis, worse in the lateral wall, and grade 1 diastolic dysfunction. Patient underwent left heart catheterization on 09/15/2019 which showed high-grade thrombotic obstruction in the equivalent of the ramus intermedius prior to bifurcation into 2 large branches. Successfully treated with PCI/DES to perpendicular branch reducing the 99% stenosis to 0% with TIMI grade III flow. Otherwise, non-obstructive CAD. See full report above. Hypertension was treated with intracoronary Nitro and 2 doses of IV Labetalol during the procedure. Cath  site soft with no signs of hematoma but very tender to palpation. Patient tolerated the procedure well. Renal function stable. Able to ambulate without any problems.   - Continue dual antiplatelet therapy with Aspirin 81mg  and Brilinta 90mg  twice daily.  - Continue beta-blocker and high-intensity statin. - Discussed  importance of aggressive risk factor modification. LDL goal <70. Hemoglobin A1c goal <7.0. BP goal <130/80. Complete smoking cessation. Patient seems very motivated to make these lifestyle changes.    Chronic Epigastric Pain  Patient states this has not been bothering him this admission. He would like to continue the Protonix which he has been taking here so will prescribe this at discharge.  Hypertension  BP has been markedly elevated at times. As high as the 200's/110's. Started on Losartan and Coreg this admission and doses have been uptitrated. BP still elevated today and heart rates in the 90's to low 100's so will increase Coreg to 25mg  twice daily. Continue Losartan 50mg  daily. Likely will need to make further medication adjustments at follow-up visit. Will repeat BMET at follow-up.  Hyperlipidemia  Lipid panel this admission: Total Cholesterol 175, Triglycerides 61, HDL 37, LDL 126. LDL goal <70 given CAD.  - Started on Lipitor 80mg  daily this admission.  - Will need repeat lipid panel and LFTs in 6-8 weeks.   Type 2 Diabetes Mellitus  Hemoglobin A1c 11.6 this admission. Managed with sliding scale insulin during admission. Diabetes Coordinator consulted and recommended Farxiga 10mg  daily and Metformin 500mg  twice daily at discharge so both of these will be prescribed. Can start Metformin 48 hours after cardiac catheterization. (After sending in prescriptions, informed that patient will need prior authorization before starting Farxiga - will take care of this at outpatient follow-up).   Tobacco Abuse  Complete cessation encouraged. Patient in agreement.   Headache  Resolved after he was able to come off IV Nitro drip.   Patient seen and examined by Dr. Meda Coffee today and determined to be stable for discharge. Outpatient follow-up has been arranged. Medications as below.    Did the patient have an acute coronary syndrome (MI, NSTEMI, STEMI, etc) this admission?:  Yes                                AHA/ACC Clinical Performance & Quality Measures: 1. Aspirin prescribed? - Yes 2. ADP Receptor Inhibitor (Plavix/Clopidogrel, Brilinta/Ticagrelor or Effient/Prasugrel) prescribed (includes medically managed patients)? - Yes 3. Beta Blocker prescribed? - Yes 4. High Intensity Statin (Lipitor 40-80mg  or Crestor 20-40mg ) prescribed? - Yes 5. EF assessed during THIS hospitalization? - Yes 6. For EF <40%, was ACEI/ARB prescribed? - Not Applicable (EF >/= 21%) 7. For EF <40%, Aldosterone Antagonist (Spironolactone or Eplerenone) prescribed? - Not Applicable (EF >/= 30%) 8. Cardiac Rehab Phase II ordered (Included Medically managed Patients)? - Yes   _____________  Discharge Vitals Blood pressure (!) 147/91, pulse 92, temperature 99 F (37.2 C), temperature source Oral, resp. rate 13, height 6' (1.829 m), weight 97.3 kg, SpO2 100 %.  Filed Weights   09/14/19 0407 09/15/19 0500 09/16/19 0442  Weight: 101.3 kg 98.7 kg 97.3 kg   General: 44 y.o. male resting comfortably in no acute distress. HEENT: Normocephalic and atraumatic.  Neck: Supple.  Heart: RRR. Distinct S1 and S2. No murmurs, gallops, or rubs. Radial and distal pedal pulses 2+ and equal bilaterally. Right radial cath site soft with no signs of hematoma but very tender to palpation. Sensation intact.  Lungs: No increased work of breathing. Clear to ausculation bilaterally. No wheezes, rhonchi, or rales.  Abdomen: Soft, non-distended, and non-tender to palpation.  Extremities: No lower extremity edema.    Skin: Warm and dry. Neuro: Alert and oriented x3. No focal deficits. Psych: Normal affect. Responds appropriately.  Labs & Radiologic Studies    CBC Recent Labs    09/15/19 0357 09/16/19 0445  WBC 11.0* 9.8  HGB 14.2 14.6  HCT 40.3 41.3  MCV 90.6 91.6  PLT 286 262   Basic Metabolic Panel Recent Labs    45/40/98 1652 09/14/19 0332 09/15/19 0357 09/16/19 0445  NA  --    < > 133* 132*  K  --    < > 3.4* 3.6    CL  --    < > 97* 101  CO2  --    < > 23 19*  GLUCOSE  --    < > 178* 242*  BUN  --    < > 9 8  CREATININE  --    < > 1.18 1.00  CALCIUM  --    < > 9.5 9.1  MG 2.2  --   --   --    < > = values in this interval not displayed.   Liver Function Tests No results for input(s): AST, ALT, ALKPHOS, BILITOT, PROT, ALBUMIN in the last 72 hours. No results for input(s): LIPASE, AMYLASE in the last 72 hours. High Sensitivity Troponin:   Recent Labs  Lab 09/13/19 1044 09/13/19 1233 09/13/19 1652 09/13/19 1927  TROPONINIHS 61* 199* 4,154* 7,535*    BNP Invalid input(s): POCBNP D-Dimer No results for input(s): DDIMER in the last 72 hours. Hemoglobin A1C Recent Labs    09/13/19 1652  HGBA1C 11.6*   Fasting Lipid Panel Recent Labs    09/14/19 0332  CHOL 175  HDL 37*  LDLCALC 126*  TRIG 61  CHOLHDL 4.7   Thyroid Function Tests Recent Labs    09/13/19 1652  TSH 0.847   _____________  CARDIAC CATHETERIZATION  Result Date: 09/15/2019  Non-ST elevation myocardial infarction without EKG changes but abnormal high-sensitivity troponin I ischemic biomarkers.  High-grade, thrombotic obstruction in the equivalent of the ramus intermedius prior to a bifurcation into 2 large branches.  The more perpendicular branch contained proximal 40% narrowing with widely patent ostium.  Successful PCI and stent implantation across the perpendicular branch reducing the 99% stenosis to 0% with TIMI grade III flow.  The device was a 2.5 x 18 Onyx postdilated to 2.75 mm in the proximal two thirds of the stent.  Widely patent left main  30% proximal LAD with mild to moderate diffuse mid and distal disease.  Widely patent circumflex with minimal luminal irregularities.  Codominant/dominant right coronary with 75% stenosis in the distal one third of the first left ventricular branch.  Mildly reduced to low normal LV systolic function with EF 45 to 50%.  No obvious regional wall motion abnormality noted.   Hypertension treated with intracoronary nitroglycerin and 2 doses of IV labetalol totaling 20 mg. RECOMMENDATIONS:  Preventive therapy: Aggressive lipid-lowering to LDL less than 70.  Glycemic control to hemoglobin A1c less than 7.  Blood pressure control to 130/80 mmHg or less.  Smoking cessation.  I increased losartan to 50 mg/day.  May need to increase carvedilol to 25 mg twice daily.  Aspirin and Brilinta x12 months.  Discharge in a.m.  DG Chest Port 1 View  Result Date: 09/13/2019 CLINICAL DATA:  44 year old male  with a history chest pain EXAM: PORTABLE CHEST 1 VIEW COMPARISON:  02/22/2018 FINDINGS: Pain cardiomediastinal silhouette unchanged in size and contour. No evidence of central vascular congestion. No pneumothorax or pleural effusion. No confluent airspace disease. No displaced fracture IMPRESSION: Negative for acute cardiopulmonary disease Electronically Signed   By: Gilmer Mor D.O.   On: 09/13/2019 11:13   ECHOCARDIOGRAM COMPLETE  Result Date: 09/14/2019    ECHOCARDIOGRAM REPORT   Patient Name:   DILAN NOVOSAD Date of Exam: 09/14/2019 Medical Rec #:  [id number]    Height:       72.0 in Accession #:    [id number]   Weight:       223.3 lb Date of Birth:  1976-05-23     BSA:          2.233 m Patient Age:    44 years     BP:           152/90 mmHg Patient Gender: M            HR:           87 bpm. Exam Location:  Inpatient Procedure: 2D Echo, Cardiac Doppler and Color Doppler Indications:    Chest Pain 786.50 / R07.9  History:        Patient has prior history of Echocardiogram examinations, most                 recent 09/13/2019. Previous Myocardial Infarction; Risk                 Factors:Hypertension, Diabetes, Dyslipidemia and Current Smoker.  Sonographer:    Celesta Gentile RCS Referring Phys: [id number] CADENCE H FURTH IMPRESSIONS  1. Left ventricular ejection fraction, by estimation, is 40 to 45%. The left ventricle has mildly decreased function. The left ventricle demonstrates regional wall  motion abnormalities. Global hypokinesis, appears worse in lateral wall. There is moderate left ventricular hypertrophy. Left ventricular diastolic parameters are consistent with Grade I diastolic dysfunction (impaired relaxation).  2. Right ventricular systolic function is normal. The right ventricular size is normal. Tricuspid regurgitation signal is inadequate for assessing PA pressure.  3. The mitral valve is normal in structure. No evidence of mitral valve regurgitation.  4. The aortic valve is tricuspid. Aortic valve regurgitation is not visualized. No aortic stenosis is present.  5. The inferior vena cava is normal in size with greater than 50% respiratory variability, suggesting right atrial pressure of 3 mmHg.  6. Compared to prior study on 09/13/19, no significant change FINDINGS  Left Ventricle: Left ventricular ejection fraction, by estimation, is 40 to 45%. The left ventricle has mildly decreased function. The left ventricle demonstrates regional wall motion abnormalities. The left ventricular internal cavity size was normal in size. There is moderate left ventricular hypertrophy. Left ventricular diastolic parameters are consistent with Grade I diastolic dysfunction (impaired relaxation). Right Ventricle: The right ventricular size is normal. No increase in right ventricular wall thickness. Right ventricular systolic function is normal. Tricuspid regurgitation signal is inadequate for assessing PA pressure. Left Atrium: Left atrial size was normal in size. Right Atrium: Right atrial size was normal in size. Pericardium: There is no evidence of pericardial effusion. Mitral Valve: The mitral valve is normal in structure. No evidence of mitral valve regurgitation. Tricuspid Valve: The tricuspid valve is normal in structure. Tricuspid valve regurgitation is not demonstrated. Aortic Valve: The aortic valve is tricuspid. Aortic valve regurgitation is not visualized. No aortic stenosis is present. Pulmonic  Valve: The pulmonic valve  was not well visualized. Pulmonic valve regurgitation is not visualized. Aorta: The aortic root is normal in size and structure. Venous: The inferior vena cava is normal in size with greater than 50% respiratory variability, suggesting right atrial pressure of 3 mmHg. IAS/Shunts: The interatrial septum was not well visualized.  LEFT VENTRICLE PLAX 2D LVIDd:         4.90 cm      Diastology LVIDs:         3.20 cm      LV e' lateral:   5.98 cm/s LV PW:         1.40 cm      LV E/e' lateral: 9.1 LV IVS:        1.20 cm      LV e' medial:    4.57 cm/s LVOT diam:     1.90 cm      LV E/e' medial:  11.9 LV SV:         42 LV SV Index:   19 LVOT Area:     2.84 cm  LV Volumes (MOD) LV vol d, MOD A2C: 105.0 ml LV vol d, MOD A4C: 115.5 ml LV vol s, MOD A2C: 43.8 ml LV vol s, MOD A4C: 61.9 ml LV SV MOD A2C:     61.2 ml LV SV MOD A4C:     115.5 ml LV SV MOD BP:      54.4 ml RIGHT VENTRICLE RV S prime:     12.10 cm/s TAPSE (M-mode): 1.9 cm LEFT ATRIUM           Index       RIGHT ATRIUM           Index LA diam:      3.20 cm 1.43 cm/m  RA Area:     13.20 cm LA Vol (A2C): 51.4 ml 23.02 ml/m RA Volume:   30.40 ml  13.62 ml/m LA Vol (A4C): 43.2 ml 19.35 ml/m  AORTIC VALVE LVOT Vmax:   86.70 cm/s LVOT Vmean:  56.600 cm/s LVOT VTI:    0.147 m  AORTA Ao Root diam: 2.90 cm MITRAL VALVE MV Area (PHT): 2.76 cm    SHUNTS MV Decel Time: 275 msec    Systemic VTI:  0.15 m MV E velocity: 54.50 cm/s  Systemic Diam: 1.90 cm MV A velocity: 89.50 cm/s MV E/A ratio:  0.61 Epifanio Lesches MD Electronically signed by Epifanio Lesches MD Signature Date/Time: 09/14/2019/8:37:39 PM    Final    CT Angio Chest/Abd/Pel for Dissection W and/or W/WO  Result Date: 09/13/2019 CLINICAL DATA:  Acute chest pain beginning at 7 a.m. today. Abnormal EKG. The pain is located anteriorly on the left. Clinical concern for EXAM: CT ANGIOGRAPHY CHEST, ABDOMEN AND PELVIS TECHNIQUE: Non-contrast CT of the chest was initially obtained.  Multidetector CT imaging through the chest, abdomen and pelvis was performed using the standard protocol during bolus administration of intravenous contrast. Multiplanar reconstructed images and MIPs were obtained and reviewed to evaluate the vascular anatomy. CONTRAST:  OMNIPAQUE IOHEXOL 350 MG/ML SOLN COMPARISON:  06/17/2017 FINDINGS: CTA CHEST FINDINGS Cardiovascular: Preferential opacification of the thoracic aorta. No evidence of thoracic aortic aneurysm or dissection. No pulmonary emboli seen. Normal heart size. No pericardial effusion. Mediastinum/Nodes: No enlarged mediastinal, hilar, or axillary lymph nodes. Thyroid gland, trachea, and esophagus demonstrate no significant findings. Lungs/Pleura: Stable scarring at the medial right lung base. No pleural fluid or pneumothorax. Musculoskeletal: Mild thoracic and lower cervical spine degenerative changes. Review of the  MIP images confirms the above findings. CTA ABDOMEN AND PELVIS FINDINGS VASCULAR Aorta: Minimal atheromatous calcifications distally. No aneurysm or dissection. Celiac: Patent without evidence of aneurysm, dissection, vasculitis or significant stenosis. SMA: Patent without evidence of aneurysm, dissection, vasculitis or significant stenosis. Renals: Both renal arteries are patent without evidence of aneurysm, dissection, vasculitis, fibromuscular dysplasia or significant stenosis. IMA: Patent without evidence of aneurysm, dissection, vasculitis or significant stenosis. Inflow: Mild bilateral internal iliac artery calcifications. No aneurysm, dissection or stenosis. Veins: No obvious venous abnormality within the limitations of this arterial phase study. Review of the MIP images confirms the above findings. NON-VASCULAR Hepatobiliary: No focal liver abnormality is seen. Status post cholecystectomy. No biliary dilatation. Pancreas: Unremarkable. No pancreatic ductal dilatation or surrounding inflammatory changes. Spleen: Normal in size  without focal abnormality. Adrenals/Urinary Tract: Adrenal glands are unremarkable. Kidneys are normal, without renal calculi, focal lesion, or hydronephrosis. Bladder is unremarkable. Stomach/Bowel: Stomach is within normal limits. Appendix appears normal. No evidence of bowel wall thickening, distention, or inflammatory changes. Lymphatic: No enlarged lymph nodes. Reproductive: Prostate is unremarkable. Other: No abdominal wall hernia or abnormality. No abdominopelvic ascites. Musculoskeletal: Mild lumbar spine degenerative changes. Review of the MIP images confirms the above findings. IMPRESSION: 1. No aortic dissection or aneurysm. 2. Minimal calcified aortic atherosclerosis. 3. No pulmonary emboli. 4. No acute abnormality in the chest, abdomen or pelvis. Aortic Atherosclerosis (ICD10-I70.0). Electronically Signed   By: Beckie Salts M.D.   On: 09/13/2019 11:51   ECHOCARDIOGRAM LIMITED  Result Date: 09/13/2019    ECHOCARDIOGRAM LIMITED REPORT   Patient Name:   CURBY CARSWELL Date of Exam: 09/13/2019 Medical Rec #:  [id number]    Height:       72.0 in Accession #:    [id number]   Weight:       230.0 lb Date of Birth:  1975/07/06     BSA:          2.261 m Patient Age:    44 years     BP:           162/111 mmHg Patient Gender: M            HR:           74 bpm. Exam Location:  Inpatient Procedure: Limited Echo and Color Doppler Indications:    Chest Pain 786.50 / R07.9  History:        Patient has no prior history of Echocardiogram examinations.                 Acute MI.  Sonographer:    Ross Ludwig RDCS (AE) Referring Phys: [id number] BRIDGETTE CHRISTOPHER IMPRESSIONS  1. Left ventricular ejection fraction, by estimation, is 60 to 65%. The left ventricle has normal function. The left ventricle demonstrates regional wall motion abnormalities (see scoring diagram/findings for description). There is mild concentric left ventricular hypertrophy. Left ventricular diastolic function could not be evaluated. There is  hypokinesis of the left ventricular, entire inferolateral wall and inferior wall.  2. Right ventricular systolic function was not well visualized. The right ventricular size is normal.  3. The mitral valve is normal in structure. Trivial mitral valve regurgitation. No evidence of mitral stenosis.  4. The aortic valve was not assessed. FINDINGS  Left Ventricle: Left ventricular ejection fraction, by estimation, is 60 to 65%. The left ventricle has normal function. The left ventricle demonstrates regional wall motion abnormalities. The left ventricular internal cavity size was normal in size. There is mild concentric left ventricular hypertrophy.  Right Ventricle: The right ventricular size is normal. No increase in right ventricular wall thickness. Right ventricular systolic function was not well visualized. Left Atrium: Left atrial size was not assessed. Right Atrium: Right atrial size was not assessed. Pericardium: There is no evidence of pericardial effusion. Mitral Valve: The mitral valve is normal in structure. Normal mobility of the mitral valve leaflets. Trivial mitral valve regurgitation. No evidence of mitral valve stenosis. Tricuspid Valve: The tricuspid valve is normal in structure. Aortic Valve: The aortic valve was not assessed. Pulmonic Valve: The pulmonic valve was not well visualized. Aorta: The aortic root was not well visualized. Venous: The inferior vena cava was not well visualized. IAS/Shunts: No atrial level shunt detected by color flow Doppler.  LEFT VENTRICLE PLAX 2D LVIDd:         5.00 cm LVIDs:         4.10 cm LV PW:         1.40 cm LV IVS:        1.20 cm  LV Volumes (MOD) LV vol d, MOD A2C: 112.0 ml LV vol d, MOD A4C: 118.0 ml LV vol s, MOD A2C: 63.8 ml LV vol s, MOD A4C: 71.2 ml LV SV MOD A2C:     48.2 ml LV SV MOD A4C:     118.0 ml LV SV MOD BP:      47.4 ml LEFT ATRIUM         Index LA diam:    3.00 cm 1.33 cm/m   AORTA Ao Root diam: 3.50 cm Armanda Magic MD Electronically signed by Armanda Magic MD Signature Date/Time: 09/13/2019/7:26:44 PM    Final    Disposition   Patient is being discharged home today in good condition.  Follow-up Plans & Appointments    Follow-up Information    Corrin Parker, PA-C Follow up.   Specialties: Physician Assistant, Cardiology Why: Hospital follow-up scheduled for 09/26/2019 at 9:45am. Please arrive 15 minutes early for check-in. If this date/time does not work, please call our office to reschedule.  Contact information: [address] [place] [address] [phone]          Discharge Instructions    Amb Referral to Cardiac Rehabilitation   Complete by: As directed    Diagnosis:  NSTEMI Coronary Stents     After initial evaluation and assessments completed: Virtual Based Care may be provided alone or in conjunction with Phase 2 Cardiac Rehab based on patient barriers.: Yes   Diet - low sodium heart healthy   Complete by: As directed    Increase activity slowly   Complete by: As directed       Discharge Medications   Allergies as of 09/16/2019      Reactions   Bee Venom    Cat Hair Extract Anxiety, Itching, Other (See Comments)   Itching , watering eyes  Itching , watering eyes  Itching , watering eyes  Itching , watering eyes    Other Itching      Medication List    TAKE these medications   aspirin 81 MG chewable tablet Chew 1 tablet (81 mg total) by mouth daily. Start taking on: September 17, 2019   atorvastatin 80 MG tablet Commonly known as: LIPITOR Take 1 tablet (80 mg total) by mouth daily at 6 PM.   carvedilol 25 MG tablet Commonly known as: COREG Take 1 tablet (25 mg total) by mouth 2 (two) times daily with a meal.   Farxiga 10  MG Tabs tablet Generic drug: dapagliflozin propanediol Take 10 mg by mouth daily before breakfast.   losartan 50 MG tablet Commonly known as: COZAAR Take 1 tablet (50 mg total) by mouth daily. Start taking on: September 17, 2019   metFORMIN 500 MG  tablet Commonly known as: Glucophage Take 1 tablet (500 mg total) by mouth 2 (two) times daily with a meal.   nitroGLYCERIN 0.4 MG SL tablet Commonly known as: NITROSTAT Place 1 tablet (0.4 mg total) under the tongue every 5 (five) minutes as needed for chest pain.   pantoprazole 40 MG tablet Commonly known as: Protonix Take 1 tablet (40 mg total) by mouth daily.   ticagrelor 90 MG Tabs tablet Commonly known as: BRILINTA Take 1 tablet (90 mg total) by mouth 2 (two) times daily.          Outstanding Labs/Studies   Will repeat BMET at follow-up.   Duration of Discharge Encounter   Greater than 30 minutes including physician time.  Signed, Corrin Parkerallie E Vanessa Alesi, PA-C 09/16/2019, 12:41 PM   The patient was seen, examined and discussed with Corrin Parkerallie E Jaclyn Andy, PA-C  and I agree with the above.    The patient underwent left heart cath yesterday with findings of 99% bifurcation stenosis in the mid first obtuse marginal, s/p PCI with a 2.25 x 18 Onyx stent. The jailed segment contained ostial to proximal 40% narrowing that increased to approximately 60% but with TIMI grade III flow. Moderate diffuse plaque in RCA, LAD, circumflex, with widely patent left main. Mid to distal left ventricular branch contains 75% narrowing.  LVEF 50%.  LVEDP 23 mmHg.  Continue ASA, Brilinta, atorvastatin  The patient is asymptomatic today, cath insertion site without bleeding or bruit.  Crea stable at 1.0, Hb 14.6  Vitals: BP elevated, carvedilol increased to 25 mg PO BID today, continue losartan 50 mg po daily, reassess at the next clinic visit  Discharge today, arrange follow up within 7 days   Tobias AlexanderKatarina Nelson, MD 09/16/2019

## 2019-09-16 NOTE — Progress Notes (Signed)
   The patient underwent left heart cath yesterday with findings of 99% bifurcation stenosis in the mid first obtuse marginal, s/p PCI with a 2.25 x 18 Onyx stent. The jailed segment contained ostial to proximal 40% narrowing that increased to approximately 60% but with TIMI grade III flow. Moderate diffuse plaque in RCA, LAD, circumflex, with widely patent left main. Mid to distal left ventricular branch contains 75% narrowing.  LVEF 50%.  LVEDP 23 mmHg.  Continue ASA, Brilinta, atorvastatin  The patient is asymptomatic today, cath insertion site without bleeding or bruit.  Crea stable at 1.0, Hb 14.6  Vitals: BP elevated, carvedilol increased to 25 mg PO BID today, continue losartan 50 mg po daily, reassess at the next clinic visit  Discharge today, arrange follow up within 7 days   Tobias Alexander, MD 09/16/2019

## 2019-09-16 NOTE — Care Management (Addendum)
8677 09-16-19 Patient has Medicaid- cost for Brilinta will be $3.00. If prescriptions are sent to [facility]- the patient will receive a 30 day free supply of Brilinta. No further needs from Case Manager. Bethena Roys, RN, BSN Case Manager    1041 09-16-19 Case Manager spoke to patient and clarified information about insurance. Patient has [employer]- spouse to bring the card and Case Manager will send to admissions to place on file. Case Manager will then submit benefits check for cost. Adelfa Koh Ocie Cornfield, RN,BSN Case Manager    1325 09-16-19 Case Manager discussed cost of Brilinta with the patient $391.00- patient has not met his out of pocket expense. Patient states he has a health savings card that may assist him with the cost of Brilinta. PA is aware that the patient may need to be changed to Plavix. No further needs from Case Manager at this time. Bethena Roys, RN,BSN Case Manager

## 2019-09-16 NOTE — Progress Notes (Signed)
CARDIAC REHAB PHASE I   PRE:  Rate/Rhythm: 85 SR  BP:  Supine: 147/91  Sitting:   Standing:    SaO2: 100%RA  MODE:  Ambulation: 650 ft   POST:  Rate/Rhythm: 117 ST  BP:  Supine:   Sitting: 173/112  Standing:    SaO2: 100%RA [id number] Pt walked 650 ft on RA with steady gait. No CP. BP elevated after walk. MI education completed with pt who voiced understanding. Stressed importance of brilinta with stent. Reviewed NTG use, MI restrictions, walking for ex, carb counting and heart healthy food choices, smoking cessation and CRP 2. Gave smoking cessation handout and pt stated he was done with smoking. Referred to [place] CRP 2 and highly recommended pt to do as he has many new risk factors to modify and he is receptive to changing.  Discussed importance of getting diabetes under control. Gave diabetic and heart healthy diets.   Luetta Nutting, RN BSN  09/16/2019 9:12 AM

## 2019-09-17 ENCOUNTER — Telehealth: Payer: Self-pay | Admitting: Student

## 2019-09-17 NOTE — Telephone Encounter (Signed)
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 He can return to work on Monday 09/22/2019. Sorry, I forgot to give him a note before he left. Can we get him a work note?  Thank you!

## 2019-09-17 NOTE — Telephone Encounter (Signed)
Pt calling inquiring if he is required to stay out of work until f/u appointment on 4/30. If so, he state he will need a letter.

## 2019-09-17 NOTE — Telephone Encounter (Signed)
Pt updated and will pick up later from office.

## 2019-09-17 NOTE — Telephone Encounter (Signed)
New Message  Pt called and stated that he was discharged from the hospital on 09/16/19 and they was supposed to put when he can go back to work but the form wasn't in there. So he is wondering if he has to go to the appt he has on 09/26/19 in order to get the form and to find out when he goes to work. He also wants to know if he should go back to work before the appt since he doesn't have anything showing that he is out of work since a certain date  Please call to discuss

## 2019-09-22 NOTE — Progress Notes (Signed)
Cardiology Office Note:    Date:  09/26/2019   ID:  Kirk Barker, DOB [birth year], MRN [id number]  PCP:  Soundra Pilon, FNP  Cardiologist:  Jodelle Red, MD  Electrophysiologist:  None   Referring MD: Soundra Pilon, FNP   Chief Complaint: hospital follow-up for NSTEMI  History of Present Illness:    Kirk Barker is a 44 y.o. male with a history of CAD s/p recent NSTEMI on 09/13/2019 treated with DES to ramus branch, hypertension, hyperlipidemia, type 2 diabetes mellitus, chronic epigastric pain, pancreatitis, GERD, tobacco use, and median arcuate ligament syndrome who is a patient of Dr. Di Kindle and presents today for hospital follow-up.   Patient recently admitted from 09/13/2019 to 09/16/2019 with NSTEMI after presenting with chest pain. Limited Echo on day of presentations showed LVEF of 60-65% with hypokinesis of the entire inferolateral wall and inferior wall. However, complete Echo the following day showed LVEF of 40-45% with global hypokinesis, worse in the lateral wall, and grade 1 diastolic dysfunction.  He underwent left cardiac catheterization on 09/15/2019 showed high-grade thrombotic obstruction in the equivalent of the ramus intermedius prior to bifurcation into 2 large branches. Successfully treated with PCI/DES to perpendicular branch reducing the 99% stenosis to 0% with TIMI grade III flow. Otherwise, non-obstructive CAD. Patient started on dual antiplatelet therapy, beta blocker, and high-intensity statin. Of note, patient markedly hypertensive on presentation with BP as high as the 200's/110's. LDL 126 and Hemoglobin A1c 11.6. Patient discharged on Aspirin 81 mg daily, Brilinta 90 mg twice daily, Coreg 25mg  twice daily, Losartan 50mg  daily, Lipitor 80mg  daily, Protonix 40mg  daily, and Metformin 500mg  twice daily. Plan was to also start Farxiga 10mg  daily per recommendations of Diabetes Coordinator. However, we were informed that this will require prior authorization  so decision was made to follow-up on this at follow-up.   Patient presents today for follow-up. Here with wife. Doing well since discharge. No recurrent chest pain. He has started walking per recommendations of Cardiac Rehab in the hospital and has done well with this. Currently only walking for about 5 minutes at a times but about to increase to 10 minutes at a time.  He notes some shortness of breath when walking up the stair but thinks this is due to not being as active recently. He also notes some transient shortness of breath at rest which sounds like it is form Brilinta. No orthopnea, PND, or lower extremity edema. He had one episode of lightheadedness recently when standing up too quickly but no palpitations or syncope. Compliant with medications and tolerating these well. He has completely stopped smoking.   Past Medical History:  Diagnosis Date  . Median arcuate ligament syndrome [facility])     Past Surgical History:  Procedure Laterality Date  . CHOLECYSTECTOMY    . LEFT HEART CATH AND CORONARY ANGIOGRAPHY N/A 09/15/2019   Procedure: LEFT HEART CATH AND CORONARY ANGIOGRAPHY;  Surgeon: , MD;  Location: [facility] INVASIVE CV LAB;  Service: Cardiovascular;  Laterality: N/A;    Current Medications: Current Meds  Medication Sig  . aspirin 81 MG chewable tablet Chew 1 tablet (81 mg total) by mouth daily.  atorvastatin (LIPITOR) 80 MG tablet Take 1 tablet (80 mg total) by mouth daily at 6 PM.  . carvedilol (COREG) 25 MG tablet Take 1 tablet (25 mg total) by mouth 2 (two) times daily with a meal.  . dapagliflozin propanediol (FARXIGA) 10 MG TABS tablet Take 10 mg by mouth daily before breakfast.  .  losartan (COZAAR) 50 MG tablet Take 1 tablet (50 mg total) by mouth daily.  . metFORMIN (GLUCOPHAGE) 500 MG tablet Take 1 tablet (500 mg total) by mouth 2 (two) times daily with a meal.  . nitroGLYCERIN (NITROSTAT) 0.4 MG SL tablet Place 1 tablet (0.4 mg total) under the tongue every 5 (five)  minutes as needed for chest pain.  . pantoprazole (PROTONIX) 40 MG tablet Take 1 tablet (40 mg total) by mouth daily.  . ticagrelor (BRILINTA) 90 MG TABS tablet Take 1 tablet (90 mg total) by mouth 2 (two) times daily.     Allergies:   Bee venom, Cat hair extract, and Other   Social History   Socioeconomic History  . Marital status: Single    Spouse name: Not on file  . Number of children: Not on file  . Years of education: Not on file  . Highest education level: Not on file  Occupational History    Employer: [employer]  Tobacco Use  . Smoking status: Current Every Day Smoker    Packs/day: 0.50    Types: Cigarettes  . Smokeless tobacco: Never Used  Substance and Sexual Activity  . Alcohol use: No  . Drug use: No  . Sexual activity: Yes  Other Topics Concern  . Not on file  Social History Narrative  . Not on file   Social Determinants of Health   Financial Resource Strain:   . Difficulty of Paying Living Expenses:   Food Insecurity:   . Worried About Programme researcher, broadcasting/film/video in the Last Year:   . Barista in the Last Year:   Transportation Needs:   . Freight forwarder (Medical):   Marland Kitchen Lack of Transportation (Non-Medical):   Physical Activity:   . Days of Exercise per Week:   . Minutes of Exercise per Session:   Stress:   . Feeling of Stress :   Social Connections:   . Frequency of Communication with Friends and Family:   . Frequency of Social Gatherings with Friends and Family:   . Attends Religious Services:   . Active Member of Clubs or Organizations:   . Attends Banker Meetings:   Marland Kitchen Marital Status:      Family History: The patient's family history includes Brain cancer in his maternal grandmother; Diabetes in his father; Stroke in his mother.  ROS:   Please see the history of present illness.     EKGs/Labs/Other Studies Reviewed:    The following studies were reviewed today:  Limited Echo 09/13/2019: Impressions: 1. Left  ventricular ejection fraction, by estimation, is 60 to 65%. The  left ventricle has normal function. The left ventricle demonstrates  regional wall motion abnormalities (see scoring diagram/findings for  description). There is mild concentric left  ventricular hypertrophy. Left ventricular diastolic function could not be  evaluated. There is hypokinesis of the left ventricular, entire  inferolateral wall and inferior wall.  2. Right ventricular systolic function was not well visualized. The right  ventricular size is normal.  3. The mitral valve is normal in structure. Trivial mitral valve  regurgitation. No evidence of mitral stenosis.  4. The aortic valve was not assessed.  _______________  Complete Echo 09/14/2019: Impressions: 1. Left ventricular ejection fraction, by estimation, is 40 to 45%. The  left ventricle has mildly decreased function. The left ventricle  demonstrates regional wall motion abnormalities. Global hypokinesis,  appears worse in lateral wall. There is  moderate left ventricular hypertrophy. Left ventricular diastolic  parameters are consistent with Grade I diastolic dysfunction (impaired  relaxation).  2. Right ventricular systolic function is normal. The right ventricular  size is normal. Tricuspid regurgitation signal is inadequate for assessing  PA pressure.  3. The mitral valve is normal in structure. No evidence of mitral valve  regurgitation.  4. The aortic valve is tricuspid. Aortic valve regurgitation is not  visualized. No aortic stenosis is present.  5. The inferior vena cava is normal in size with greater than 50%  respiratory variability, suggesting right atrial pressure of 3 mmHg.  6. Compared to prior study on 09/13/19, no significant change  _______________  Left Heart Catheterization 09/15/2019:  Non-ST elevation myocardial infarction without EKG changes but abnormal high-sensitivity troponin I ischemic biomarkers.  High-grade,  thrombotic obstruction in the equivalent of the ramus intermedius prior to a bifurcation into 2 large branches.  The more perpendicular branch contained proximal 40% narrowing with widely patent ostium.  Successful PCI and stent implantation across the perpendicular branch reducing the 99% stenosis to 0% with TIMI grade III flow.  The device was a 2.5 x 18 Onyx postdilated to 2.75 mm in the proximal two thirds of the stent.  Widely patent left main  30% proximal LAD with mild to moderate diffuse mid and distal disease.  Widely patent circumflex with minimal luminal irregularities.  Codominant/dominant right coronary with 75% stenosis in the distal one third of the first left ventricular branch.  Mildly reduced to low normal LV systolic function with EF 45 to 50%.  No obvious regional wall motion abnormality noted.  Hypertension treated with intracoronary nitroglycerin and 2 doses of IV labetalol totaling 20 mg.  Recommendations:  Preventive therapy: Aggressive lipid-lowering to LDL less than 70.  Glycemic control to hemoglobin A1c less than 7.  Blood pressure control to 130/80 mmHg or less.  Smoking cessation.  I increased losartan to 50 mg/day.  May need to increase carvedilol to 25 mg twice daily.  Aspirin and Brilinta x12 months.  Discharge in a.m.  EKG:  EKG ordered today. EKG personally reviewed and demonstrates normal sinus rhythm, rate 78 bpm, with no acute ST/T changes. Normal axis. Normal PR and QRS intervals. QTc 392 ms.  Recent Labs: 09/13/2019: ALT 25; Magnesium 2.2; TSH 0.847 09/16/2019: BUN 8; Creatinine, Ser 1.00; Hemoglobin 14.6; Platelets 262; Potassium 3.6; Sodium 132  Recent Lipid Panel    Component Value Date/Time   CHOL 175 09/14/2019 0332   TRIG 61 09/14/2019 0332   HDL 37 (L) 09/14/2019 0332   CHOLHDL 4.7 09/14/2019 0332   VLDL 12 09/14/2019 0332   LDLCALC 126 (H) 09/14/2019 0332    Physical Exam:    Vital Signs: BP 122/78   Pulse 78   Ht 6' (1.829  m)   Wt 225 lb 12.8 oz (102.4 kg)   SpO2 98%   BMI 30.62 kg/m     Wt Readings from Last 3 Encounters:  09/26/19 225 lb 12.8 oz (102.4 kg)  09/16/19 214 lb 8.1 oz (97.3 kg)  03/03/18 230 lb (104.3 kg)     General: 44 y.o. male in no acute distress. HEENT: Normocephalic and atraumatic.  Neck: Supple. No carotid bruits. No JVD. Heart: RRR. Distinct S1 and S2. No murmurs, gallops, or rubs. Radial pulses 2+ and equal bilaterally. Right radial cath site soft with no signs of hematoma.  Lungs: No increased work of breathing. Clear to ausculation bilaterally. No wheezes, rhonchi, or rales.  Abdomen: Soft, non-distended, and non-tender to palpation. Extremities: No lower  extremity edema.    Skin: Warm and dry. Neuro: Alert and oriented x3. No focal deficits. Psych: Normal affect. Responds appropriately.   Assessment:    1. Coronary artery disease involving native coronary artery of native heart without angina pectoris   2. Ischemic cardiomyopathy   3. Essential hypertension   4. Hyperlipidemia, unspecified hyperlipidemia type   5. Type 2 diabetes mellitus with complication, without long-term current use of insulin (HCC)     Plan:    CAD with Recent NSTEMI - Recently admitted with NSTEMI. LHC showed high-grade thrombotic obstruction in the equivalent of the ramus intermedius prior to bifurcation into 2 large branches. Successfully treated with PCI/DES to perpendicular branch. Otherwise, non-obstructive CAD. - No recurrent chest pain.  - Continue dual antiplatelet therapy with Aspirin 81mg  daily and Brilinta 90mg  twice daily. He does note some transient shortness of breath which sounds like it is from the [place] but willing to continue it for now. Provided patient assistance forms today.  - Continue beta-blocker and high-intensity statin.  - Recommend Cardiac Rehab.  Ischemic Cardiomyopathy - Echo showed LVEF of 40-45% with global hypokinesis, worse in the lateral wall, and grade 1  diastolic dysfunction.  - No signs of volume overload.  - Continue beta-blocker and ARB.  - Can consider repeat Echo in 3 months to reassess EF.   Hypertension - BP well controlled.  - Continue Coreg 25mg  twice daily and Losartan 50mg  daily. - Will recheck BMET today.   Hyperlipidemia - Lipid panel on 09/14/2019: Total Cholesterol 175, Triglycerides 61, HDL 37, LDL 126.  - LDL goal <70 given CAD.  - Started on Lipitor 80mg  daily during recent admission. - Will repeat lipid panel and LFTs in 6-8 weeks.   Type 2 Diabetes Mellitus - Hemoglobin A1c 11.6% on 09/13/2019. - Started on Metformin 500mg  twice daily.  - Diabetes coordinator during recent admission also recommended starting Farxiga but this requires prior authorization. Patient would like to wait on starting this and discuss with PCP.   Tobacco Abuse - Patient has completely stopped smoking since discharge.  - Congratulated him on this and encouraged him not to restart smoking.  Disposition: Follow up in 3 months with Dr.   Medication Adjustments/Labs and Tests Ordered: Current medicines are reviewed at length with the patient today.  Concerns regarding medicines are outlined above.  Orders Placed This Encounter  Procedures  . Basic metabolic panel  . Lipid panel  . Hepatic function panel  . EKG 12-Lead   No orders of the defined types were placed in this encounter.   Patient Instructions  Medication Instructions:  Your physician recommends that you continue on your current medications as directed. Please refer to the Current Medication list given to you today.  *If you need a refill on your cardiac medications before your next appointment, please call your pharmacy*   Lab Work: BMET today; Lipids, LFT in 6-8 weeks  If you have labs (blood work) drawn today and your tests are completely normal, you will receive your results only by: 09/16/2019 MyChart Message (if you have MyChart) OR . A paper copy in the  mail If you have any lab test that is abnormal or we need to change your treatment, we will call you to review the results.   Testing/Procedures: None   Follow-Up: At [facility], you and your health needs are our priority.  As part of our continuing mission to provide you with exceptional heart care, we have created designated Provider Care  Teams.  These Care Teams include your primary Cardiologist (physician) and Advanced Practice Providers (APPs -  Physician Assistants and Nurse Practitioners) who all work together to provide you with the care you need, when you need it.  We recommend signing up for the patient portal called "MyChart".  Sign up information is provided on this After Visit Summary.  MyChart is used to connect with patients for Virtual Visits (Telemedicine).  Patients are able to view lab/test results, encounter notes, upcoming appointments, etc.  Non-urgent messages can be sent to your provider as well.   To learn more about what you can do with MyChart, go to [email or web address].    Your next appointment:   3 month(s)  The format for your next appointment:   In Person  Provider:   Buford Dresser, MD   Other Instructions Complete your portion of the Brilinta Pt Asst paperwork that was given to you today then bring back to our office for [place] to complete our portion and have Provider sign. We will fax the paperwork for you.      Signed, Darreld Mclean, PA-C  09/26/2019 11:58 AM    [facility]

## 2019-09-26 ENCOUNTER — Other Ambulatory Visit: Payer: Self-pay

## 2019-09-26 ENCOUNTER — Encounter: Payer: Self-pay | Admitting: Student

## 2019-09-26 ENCOUNTER — Ambulatory Visit (INDEPENDENT_AMBULATORY_CARE_PROVIDER_SITE_OTHER): Payer: Medicaid Other | Admitting: Student

## 2019-09-26 VITALS — BP 122/78 | HR 78 | Ht 72.0 in | Wt 225.8 lb

## 2019-09-26 DIAGNOSIS — I2510 Atherosclerotic heart disease of native coronary artery without angina pectoris: Secondary | ICD-10-CM

## 2019-09-26 DIAGNOSIS — I255 Ischemic cardiomyopathy: Secondary | ICD-10-CM

## 2019-09-26 DIAGNOSIS — E118 Type 2 diabetes mellitus with unspecified complications: Secondary | ICD-10-CM

## 2019-09-26 DIAGNOSIS — E785 Hyperlipidemia, unspecified: Secondary | ICD-10-CM | POA: Diagnosis not present

## 2019-09-26 DIAGNOSIS — I10 Essential (primary) hypertension: Secondary | ICD-10-CM

## 2019-09-26 DIAGNOSIS — Z720 Tobacco use: Secondary | ICD-10-CM

## 2019-09-26 LAB — BASIC METABOLIC PANEL
BUN/Creatinine Ratio: 11 (ref 9–20)
BUN: 13 mg/dL (ref 6–24)
CO2: 20 mmol/L (ref 20–29)
Calcium: 10 mg/dL (ref 8.7–10.2)
Chloride: 99 mmol/L (ref 96–106)
Creatinine, Ser: 1.22 mg/dL (ref 0.76–1.27)
GFR calc Af Amer: 82 mL/min/{1.73_m2} (ref >59)
GFR calc non Af Amer: 71 mL/min/{1.73_m2} (ref >59)
Glucose: 304 mg/dL — ABNORMAL HIGH (ref 65–99)
Potassium: 5.1 mmol/L (ref 3.5–5.2)
Sodium: 133 mmol/L — ABNORMAL LOW (ref 134–144)

## 2019-09-26 NOTE — Patient Instructions (Signed)
Medication Instructions:  Your physician recommends that you continue on your current medications as directed. Please refer to the Current Medication list given to you today.  *If you need a refill on your cardiac medications before your next appointment, please call your pharmacy*   Lab Work: BMET today; Lipids, LFT in 6-8 weeks  If you have labs (blood work) drawn today and your tests are completely normal, you will receive your results only by: Marland Kitchen MyChart Message (if you have MyChart) OR . A paper copy in the mail If you have any lab test that is abnormal or we need to change your treatment, we will call you to review the results.   Testing/Procedures: None   Follow-Up: At [facility], you and your health needs are our priority.  As part of our continuing mission to provide you with exceptional heart care, we have created designated Provider Care Teams.  These Care Teams include your primary Cardiologist (physician) and Advanced Practice Providers (APPs -  Physician Assistants and Nurse Practitioners) who all work together to provide you with the care you need, when you need it.  We recommend signing up for the patient portal called "MyChart".  Sign up information is provided on this After Visit Summary.  MyChart is used to connect with patients for Virtual Visits (Telemedicine).  Patients are able to view lab/test results, encounter notes, upcoming appointments, etc.  Non-urgent messages can be sent to your provider as well.   To learn more about what you can do with MyChart, go to [email or web address].    Your next appointment:   3 month(s)  The format for your next appointment:   In Person  Provider:   Jodelle Red, MD   Other Instructions Complete your portion of the [facility] Pt Asst paperwork that was given to you today then bring back to our office for [place] to complete our portion and have Provider sign. We will fax the paperwork for you.

## 2019-10-15 ENCOUNTER — Other Ambulatory Visit: Payer: Self-pay | Admitting: Cardiology

## 2019-10-15 MED ORDER — ATORVASTATIN CALCIUM 80 MG PO TABS: 80.0000 mg | ORAL_TABLET | Freq: Every day | ORAL | 2 refills | Status: DC

## 2019-10-15 MED ORDER — FARXIGA 10 MG PO TABS: 10.0000 mg | ORAL_TABLET | Freq: Every day | ORAL | 3 refills | Status: DC

## 2019-10-15 MED ORDER — PANTOPRAZOLE SODIUM 40 MG PO TBEC: 40.0000 mg | DELAYED_RELEASE_TABLET | Freq: Every day | ORAL | 3 refills | Status: DC

## 2019-10-15 MED ORDER — CARVEDILOL 25 MG PO TABS: 25.0000 mg | ORAL_TABLET | Freq: Two times a day (BID) | ORAL | 2 refills | Status: DC

## 2019-10-15 MED ORDER — METFORMIN HCL 500 MG PO TABS: 500.0000 mg | ORAL_TABLET | Freq: Two times a day (BID) | ORAL | 3 refills | Status: DC

## 2019-10-15 MED ORDER — LOSARTAN POTASSIUM 50 MG PO TABS: 50.0000 mg | ORAL_TABLET | Freq: Every day | ORAL | 3 refills | Status: DC

## 2019-10-15 MED ORDER — TICAGRELOR 90 MG PO TABS: 90.0000 mg | ORAL_TABLET | Freq: Two times a day (BID) | ORAL | 7 refills | Status: DC

## 2019-10-15 NOTE — Telephone Encounter (Signed)
New Message     *STAT* If patient is at the pharmacy, call can be transferred to refill team.   1. Which medications need to be refilled? (please list name of each medication and dose if known) atorvastatin (LIPITOR) 80 MG tablet carvedilol (COREG) 25 MG tablet dapagliflozin propanediol (FARXIGA) 10 MG TABS tablet losartan (COZAAR) 50 MG tablet metFORMIN (GLUCOPHAGE) 500 MG tablet pantoprazole (PROTONIX) 40 MG tablet ticagrelor (BRILINTA) 90 MG TABS tablet  2. Which pharmacy/location (including street and city if local pharmacy) is medication to be sent to? [id number] - [place], [place] - [address] [phone]  3. Do they need a 30 day or 90 day supply? 90    Pt would like to get today

## 2019-11-21 ENCOUNTER — Encounter: Payer: Self-pay | Admitting: Family Medicine

## 2019-11-21 ENCOUNTER — Ambulatory Visit (INDEPENDENT_AMBULATORY_CARE_PROVIDER_SITE_OTHER): Payer: BC Managed Care – PPO | Admitting: Family Medicine

## 2019-11-21 ENCOUNTER — Other Ambulatory Visit: Payer: Self-pay

## 2019-11-21 VITALS — BP 122/74 | HR 85 | Temp 97.7°F | Ht 72.0 in | Wt 226.9 lb

## 2019-11-21 DIAGNOSIS — E7800 Pure hypercholesterolemia, unspecified: Secondary | ICD-10-CM

## 2019-11-21 DIAGNOSIS — K219 Gastro-esophageal reflux disease without esophagitis: Secondary | ICD-10-CM | POA: Diagnosis not present

## 2019-11-21 DIAGNOSIS — I25110 Atherosclerotic heart disease of native coronary artery with unstable angina pectoris: Secondary | ICD-10-CM

## 2019-11-21 DIAGNOSIS — G8929 Other chronic pain: Secondary | ICD-10-CM

## 2019-11-21 DIAGNOSIS — IMO0002 Reserved for concepts with insufficient information to code with codable children: Secondary | ICD-10-CM

## 2019-11-21 DIAGNOSIS — R1013 Epigastric pain: Secondary | ICD-10-CM

## 2019-11-21 DIAGNOSIS — L84 Corns and callosities: Secondary | ICD-10-CM

## 2019-11-21 DIAGNOSIS — I10 Essential (primary) hypertension: Secondary | ICD-10-CM

## 2019-11-21 DIAGNOSIS — E1159 Type 2 diabetes mellitus with other circulatory complications: Secondary | ICD-10-CM | POA: Diagnosis not present

## 2019-11-21 DIAGNOSIS — E1165 Type 2 diabetes mellitus with hyperglycemia: Secondary | ICD-10-CM

## 2019-11-21 LAB — POCT GLYCOSYLATED HEMOGLOBIN (HGB A1C): Hemoglobin A1C: 10.3 % — AB (ref 4.0–5.6)

## 2019-11-21 MED ORDER — PANTOPRAZOLE SODIUM 40 MG PO TBEC: 40.0000 mg | DELAYED_RELEASE_TABLET | Freq: Every day | ORAL | 3 refills | Status: DC

## 2019-11-21 MED ORDER — METFORMIN HCL 500 MG PO TABS: ORAL_TABLET | ORAL | 3 refills | Status: DC

## 2019-11-21 NOTE — Patient Instructions (Signed)
Increase the metformin to 500 mg TWO twice daily  Set up 2-3 month follow up for physical exam  Let me know in about 2 weeks of GERD not better with Protonix.

## 2019-11-21 NOTE — Progress Notes (Signed)
New Patient Office Visit  Subjective:  [name], male    DOB: [birth year]  Age: 44 y.o. MRN: [id number]  CC:  Chief Complaint  Patient presents with  . New Patient (Initial Visit)    wants to discuss stomach pain     HPI Kirk Barker presents for establishing care..  He had NSTEMI on 09/13/2019 treated with drug-eluting stent to the ramus branch.  Other medical problems include history of nicotine use, type 2 diabetes, history of reported pancreatitis, chronic epigastric pain of unclear etiology, hyperlipidemia, hypertension.  He did quit smoking after his event above.  He seemed to not be cognizant of the fact that he has type 2 diabetes.  We reviewed that his A1c was 11.6% during hospitalization.  He does take [place] and Metformin now.  He has scheduled eye exam.  Not monitoring blood sugars.  No polyuria or polydipsia.  Denies any recent major weight changes.  Chronic almost daily GERD symptoms.  He had Protonix on his med list but is not taking.  He has tried things like Pepcid, antacids, and currently omeprazole 20 mg daily without good relief.  He has longstanding history of chronic epigastric pain.  He states he seen 6 different gastrointestinal specialists with prior imaging as well as multiple EGDs with no clear etiology.  Recently placed on high-dose atorvastatin for hyperlipidemia.  Has not had follow-up lipids yet.  He has scheduled follow-up with cardiology soon.  LDL cholesterol was 126.  He has corn/callus left foot between the fourth and fifth toes.  Family history significant for both parents having type 2 diabetes.  His father had prostate cancer.  Social history-patient was married 2019.  Originally from [place].  Moved to [place] couple years ago.  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  Quit smoking with recent heart event.  Only occasional alcohol use.  Surgical history-cholecystectomy 2015  Past Medical History:  Diagnosis Date  .  Median arcuate ligament syndrome [facility])     Past Surgical History:  Procedure Laterality Date  . CHOLECYSTECTOMY    . LEFT HEART CATH AND CORONARY ANGIOGRAPHY N/A 09/15/2019   Procedure: LEFT HEART CATH AND CORONARY ANGIOGRAPHY;  Surgeon: Lyn Records, MD;  Location: [facility] INVASIVE CV LAB;  Service: Cardiovascular;  Laterality: N/A;    Family History  Problem Relation Age of Onset  . Stroke Mother   . Diabetes Father   . Brain cancer Maternal Grandmother     Social History   Socioeconomic History  . Marital status: Single    Spouse name: Not on file  . Number of children: Not on file  . Years of education: Not on file  . Highest education level: Not on file  Occupational History    Employer: [employer]  Tobacco Use  . Smoking status: Current Every Day Smoker    Packs/day: 0.50    Types: Cigarettes  . Smokeless tobacco: Never Used  Vaping Use  . Vaping Use: Never used  Substance and Sexual Activity  . Alcohol use: No  . Drug use: No  . Sexual activity: Yes  Other Topics Concern  . Not on file  Social History Narrative  . Not on file   Social Determinants of Health   Financial Resource Strain:   . Difficulty of Paying Living Expenses:   Food Insecurity:   . Worried About Programme researcher, broadcasting/film/video in the Last Year:   . [employer] of Food in the Last Year:  Transportation Needs:   . Freight forwarder (Medical):   Marland Kitchen Lack of Transportation (Non-Medical):   Physical Activity:   . Days of Exercise per Week:   . Minutes of Exercise per Session:   Stress:   . Feeling of Stress :   Social Connections:   . Frequency of Communication with Friends and Family:   . Frequency of Social Gatherings with Friends and Family:   . Attends Religious Services:   . Active Member of Clubs or Organizations:   . Attends Banker Meetings:   Marland Kitchen Marital Status:   Intimate Partner Violence:   . Fear of Current or Ex-Partner:   . Emotionally Abused:   Marland Kitchen Physically Abused:     . Sexually Abused:     ROS Review of Systems  Constitutional: Negative for fatigue.  Eyes: Negative for visual disturbance.  Respiratory: Negative for cough, chest tightness and shortness of breath.   Cardiovascular: Negative for chest pain, palpitations and leg swelling.  Neurological: Negative for dizziness, syncope, weakness, light-headedness and headaches.    Objective:   Today's Vitals: BP 122/74 (BP Location: Left Arm, Patient Position: Sitting, Cuff Size: Normal)   Pulse 85   Temp 97.7 F (36.5 C) (Temporal)   Ht 6' (1.829 m)   Wt 226 lb 14.4 oz (102.9 kg)   SpO2 95%   BMI 30.77 kg/m   Physical Exam Constitutional:      Appearance: He is well-developed.  HENT:     Right Ear: External ear normal.     Left Ear: External ear normal.  Eyes:     Pupils: Pupils are equal, round, and reactive to light.  Neck:     Thyroid: No thyromegaly.  Cardiovascular:     Rate and Rhythm: Normal rate and regular rhythm.  Pulmonary:     Effort: Pulmonary effort is normal. No respiratory distress.     Breath sounds: Normal breath sounds. No wheezing or rales.  Musculoskeletal:     Cervical back: Neck supple.  Skin:    Comments: Corn/callus area between the fourth and fifth digits of the left foot.  No signs of secondary infection.  Good distal foot pulses  Neurological:     Mental Status: He is alert and oriented to person, place, and time.     Assessment & Plan:   #1 history of CAD.  Recent NSTEMI as above.  No recent chest pains. -Continue close follow-up with cardiology  #2 history of chronic GERD.  Currently not controlled with low-dose omeprazole -Dietary factors discussed.  Switch to Protonix 40 mg once daily and be in touch in 2 to 3 weeks if not improving number  #3 chronic epigastric pain of uncertain origin.  Has had extensive work-up as above unrevealing  #4 type 2 diabetes uncontrolled with recent A1c 11.6%.  Repeat today which is not quite 3 months out from  instituting therapy 10.3%.  We were surprised this only came down to 1.3% with Metformin and Farxiga -Increase Metformin to 500 mg 2 tablets twice daily. -Set up physical and we will plan to reassess A1c in 2 to 3 months.  Consider additional medication then if not further to goal -He has eye exam set up as above  #5 hypertension stable and at goal  #6 dyslipidemia.  Recent LDL 126 prior to statin use.  Goal LDL less than 70.  He has future labs with cardiology.  #7 Corn/callus left foot between 4th and 5th toes -set up podiatry referral.  Outpatient Encounter Medications as of 11/21/2019  Medication Sig  . aspirin 81 MG chewable tablet Chew 1 tablet (81 mg total) by mouth daily.  Marland Kitchen atorvastatin (LIPITOR) 80 MG tablet Take 1 tablet (80 mg total) by mouth daily at 6 PM.  . carvedilol (COREG) 25 MG tablet Take 1 tablet (25 mg total) by mouth 2 (two) times daily with a meal.  . dapagliflozin propanediol (FARXIGA) 10 MG TABS tablet Take 10 mg by mouth daily before breakfast.  . losartan (COZAAR) 50 MG tablet Take 1 tablet (50 mg total) by mouth daily.  . nitroGLYCERIN (NITROSTAT) 0.4 MG SL tablet Place 1 tablet (0.4 mg total) under the tongue every 5 (five) minutes as needed for chest pain.  . ticagrelor (BRILINTA) 90 MG TABS tablet Take 1 tablet (90 mg total) by mouth 2 (two) times daily.  . [DISCONTINUED] metFORMIN (GLUCOPHAGE) 500 MG tablet Take 1 tablet (500 mg total) by mouth 2 (two) times daily with a meal.  . metFORMIN (GLUCOPHAGE) 500 MG tablet Take two tablets by mouth twice daily  . pantoprazole (PROTONIX) 40 MG tablet Take 1 tablet (40 mg total) by mouth daily.  . [DISCONTINUED] pantoprazole (PROTONIX) 40 MG tablet Take 1 tablet (40 mg total) by mouth daily. (Patient not taking: Reported on 11/21/2019)  . [DISCONTINUED] pantoprazole (PROTONIX) 40 MG tablet Take 1 tablet (40 mg total) by mouth daily.   No facility-administered encounter medications on file as of 11/21/2019.     Follow-up: No follow-ups on file.   Carolann Littler, MD

## 2019-11-23 DIAGNOSIS — K219 Gastro-esophageal reflux disease without esophagitis: Secondary | ICD-10-CM | POA: Insufficient documentation

## 2019-12-04 ENCOUNTER — Encounter: Payer: Self-pay | Admitting: Cardiology

## 2019-12-04 ENCOUNTER — Ambulatory Visit (INDEPENDENT_AMBULATORY_CARE_PROVIDER_SITE_OTHER): Payer: BC Managed Care – PPO | Admitting: Cardiology

## 2019-12-04 ENCOUNTER — Other Ambulatory Visit: Payer: Self-pay

## 2019-12-04 VITALS — BP 122/85 | HR 85 | Temp 97.0°F | Ht 72.0 in | Wt 232.0 lb

## 2019-12-04 DIAGNOSIS — I2510 Atherosclerotic heart disease of native coronary artery without angina pectoris: Secondary | ICD-10-CM | POA: Diagnosis not present

## 2019-12-04 DIAGNOSIS — I10 Essential (primary) hypertension: Secondary | ICD-10-CM | POA: Diagnosis not present

## 2019-12-04 DIAGNOSIS — Z7189 Other specified counseling: Secondary | ICD-10-CM

## 2019-12-04 DIAGNOSIS — I255 Ischemic cardiomyopathy: Secondary | ICD-10-CM | POA: Diagnosis not present

## 2019-12-04 DIAGNOSIS — E785 Hyperlipidemia, unspecified: Secondary | ICD-10-CM | POA: Diagnosis not present

## 2019-12-04 DIAGNOSIS — E118 Type 2 diabetes mellitus with unspecified complications: Secondary | ICD-10-CM

## 2019-12-04 NOTE — Progress Notes (Signed)
Cardiology Office Note:    Date:  12/04/2019   ID:  Kirk Barker, DOB June 03, 1975, MRN [id number]  PCP:  Kirk Covey, MD  Cardiologist:  Kirk Red, MD  Electrophysiologist:  None   Referring MD: Soundra Pilon, FNP   Chief Complaint: follow up  History of Present Illness:    Kirk Barker is a 44 y.o. male with a history of CAD s/p recent NSTEMI on 09/13/2019 treated with DES to ramus branch, hypertension, hyperlipidemia, type 2 diabetes mellitus, chronic epigastric pain, pancreatitis, GERD, tobacco use, and median arcuate ligament syndrome seen for follow up today.  Cardiac history: 08/2019 admission for NSTEMI. Cath showed high-grade thrombotic obstruction in the equivalent of the ramus intermedius prior to bifurcation into 2 large branches. Successfully treated with PCI/DES to perpendicular branch reducing the 99% stenosis to 0% with TIMI grade III flow. Otherwise, non-obstructive CAD. Patient started on dual antiplatelet therapy, beta blocker, and high-intensity statin. Of note, patient markedly hypertensive on presentation with BP as high as the 200's/110's. LDL 126 and Hemoglobin A1c 11.6.   Today:  Remains tobacco free since leaving the hospital. Has occasionally forgotten aspirin. Reviewed medications at length today, what they do, indications, etc. No side effects. Has not required nitroglycerin.  Discussed activity recommendations. Active every day, walks office building at lunch daily. Has gone swimming, etc without issues. Walks dogs as well. Discussed diet as well, eating healthier. Lots of tuna, salmon, fruit. Replaced salt with Mrs. Dash. Sometimes does get heartburn.  Doesn't check BP at home. Well controlled in office today.  Denies chest pain, shortness of breath at rest or with normal exertion. No PND, orthopnea, LE edema or unexpected weight gain. No syncope or palpitations.  Has eaten watermelon, eggs today. Has lab appt next week, plans to be  fasting.  Past Medical History:  Diagnosis Date  . Median arcuate ligament syndrome [facility])     Past Surgical History:  Procedure Laterality Date  . CHOLECYSTECTOMY    . LEFT HEART CATH AND CORONARY ANGIOGRAPHY N/A 09/15/2019   Procedure: LEFT HEART CATH AND CORONARY ANGIOGRAPHY;  Surgeon: Lyn Records, MD;  Location: [facility] INVASIVE CV LAB;  Service: Cardiovascular;  Laterality: N/A;    Current Medications: No outpatient medications have been marked as taking for the 12/04/19 encounter (Appointment) with Kirk Red, MD.     Allergies:   Bee venom, Cat hair extract, and Other   Social History   Tobacco Use  . Smoking status: Current Every Day Smoker    Packs/day: 0.50    Types: Cigarettes  . Smokeless tobacco: Never Used  Vaping Use  . Vaping Use: Never used  Substance Use Topics  . Alcohol use: No  . Drug use: No    Family History: The patient's family history includes Brain cancer in his maternal grandmother; Diabetes in his father; Stroke in his mother.  ROS:   Please see the history of present illness.     EKGs/Labs/Other Studies Reviewed:    The following studies were reviewed today:  Limited Echo 09/13/2019: Impressions: 1. Left ventricular ejection fraction, by estimation, is 60 to 65%. The  left ventricle has normal function. The left ventricle demonstrates  regional wall motion abnormalities (see scoring diagram/findings for  description). There is mild concentric left  ventricular hypertrophy. Left ventricular diastolic function could not be  evaluated. There is hypokinesis of the left ventricular, entire  inferolateral wall and inferior wall.  2. Right ventricular systolic function was not well visualized.  The right  ventricular size is normal.  3. The mitral valve is normal in structure. Trivial mitral valve  regurgitation. No evidence of mitral stenosis.  4. The aortic valve was not assessed.  _______________  Complete Echo  09/14/2019: Impressions: 1. Left ventricular ejection fraction, by estimation, is 40 to 45%. The  left ventricle has mildly decreased function. The left ventricle  demonstrates regional wall motion abnormalities. Global hypokinesis,  appears worse in lateral wall. There is  moderate left ventricular hypertrophy. Left ventricular diastolic  parameters are consistent with Grade I diastolic dysfunction (impaired  relaxation).  2. Right ventricular systolic function is normal. The right ventricular  size is normal. Tricuspid regurgitation signal is inadequate for assessing  PA pressure.  3. The mitral valve is normal in structure. No evidence of mitral valve  regurgitation.  4. The aortic valve is tricuspid. Aortic valve regurgitation is not  visualized. No aortic stenosis is present.  5. The inferior vena cava is normal in size with greater than 50%  respiratory variability, suggesting right atrial pressure of 3 mmHg.  6. Compared to prior study on 09/13/19, no significant change  _______________  Left Heart Catheterization 09/15/2019:  Non-ST elevation myocardial infarction without EKG changes but abnormal high-sensitivity troponin I ischemic biomarkers.  High-grade, thrombotic obstruction in the equivalent of the ramus intermedius prior to a bifurcation into 2 large branches.  The more perpendicular branch contained proximal 40% narrowing with widely patent ostium.  Successful PCI and stent implantation across the perpendicular branch reducing the 99% stenosis to 0% with TIMI grade III flow.  The device was a 2.5 x 18 Onyx postdilated to 2.75 mm in the proximal two thirds of the stent.  Widely patent left main  30% proximal LAD with mild to moderate diffuse mid and distal disease.  Widely patent circumflex with minimal luminal irregularities.  Codominant/dominant right coronary with 75% stenosis in the distal one third of the first left ventricular branch.  Mildly reduced to  low normal LV systolic function with EF 45 to 50%.  No obvious regional wall motion abnormality noted.  Hypertension treated with intracoronary nitroglycerin and 2 doses of IV labetalol totaling 20 mg.  Recommendations:  Preventive therapy: Aggressive lipid-lowering to LDL less than 70.  Glycemic control to hemoglobin A1c less than 7.  Blood pressure control to 130/80 mmHg or less.  Smoking cessation.  I increased losartan to 50 mg/day.  May need to increase carvedilol to 25 mg twice daily.  Aspirin and Brilinta x12 months.  Discharge in a.m.  EKG:  EKG personally reviewed today. EKG ordered 09/26/19 demonstrates normal sinus rhythm, rate 78 bpm, with no acute ST/T changes. Normal axis. Normal PR and QRS intervals. QTc 392 ms.  Recent Labs: 09/13/2019: ALT 25; Magnesium 2.2; TSH 0.847 09/16/2019: Hemoglobin 14.6; Platelets 262 09/26/2019: BUN 13; Creatinine, Ser 1.22; Potassium 5.1; Sodium 133  Recent Lipid Panel    Component Value Date/Time   CHOL 175 09/14/2019 0332   TRIG 61 09/14/2019 0332   HDL 37 (L) 09/14/2019 0332   CHOLHDL 4.7 09/14/2019 0332   VLDL 12 09/14/2019 0332   LDLCALC 126 (H) 09/14/2019 0332    Physical Exam:    Vital Signs: BP 122/85   Pulse 85   Temp (!) 97 F (36.1 C)   Ht 6' (1.829 m)   Wt 232 lb (105.2 kg)   SpO2 98%   BMI 31.46 kg/m     Wt Readings from Last 3 Encounters:  11/21/19 226 lb 14.4  oz (102.9 kg)  09/26/19 225 lb 12.8 oz (102.4 kg)  09/16/19 214 lb 8.1 oz (97.3 kg)    GEN: Well nourished, well developed in no acute distress HEENT: Normal, moist mucous membranes NECK: No JVD CARDIAC: regular rhythm, normal S1 and S2, no rubs or gallops. No murmur. VASCULAR: Radial and DP pulses 2+ bilaterally. No carotid bruits RESPIRATORY:  Clear to auscultation without rales, wheezing or rhonchi  ABDOMEN: Soft, non-tender, non-distended MUSCULOSKELETAL:  Ambulates independently SKIN: Warm and dry, no edema NEUROLOGIC:  Alert and oriented x 3.  No focal neuro deficits noted. PSYCHIATRIC:  Normal affect   Assessment:    1. Coronary artery disease involving native coronary artery of native heart without angina pectoris   2. Ischemic cardiomyopathy   3. Essential hypertension   4. Hyperlipidemia, unspecified hyperlipidemia type   5. Type 2 diabetes mellitus with complication, without long-term current use of insulin (HCC)   6. Cardiac risk counseling   7. Counseling on health promotion and disease prevention     Plan:    CAD with Recent NSTEMI -s/p PCI/DES to ramus branch. Otherwise, non-obstructive CAD. -did extensive medication teaching today -continue aspirin 81 mg daily, ticagrelor 90 mg BID (DAPT until 08/2020) -continue carvedilol, atorvastation -continue dapagliflozin  Ischemic Cardiomyopathy -Echo showed LVEF of 40-45% with global hypokinesis, worse in the lateral wall, and grade 1 diastolic dysfunction.  -euvolemic on exam, NYHA class I -continue carvedilol, losartan, dapagliflozin  Hypertension -near goal of <130/80 -continue carvedilol, losartan as above  Hyperlipidemia -Lipid panel on 09/14/2019: Total Cholesterol 175, Triglycerides 61, HDL 37, LDL 126. This is prior to statin -continue atorvastatin 80 mg daily -repeat lipids ordered, with LFTs. He will return fasting next week to have these drawn  Type 2 Diabetes Mellitus -Hemoglobin A1c 11.6% on 09/13/2019. Recheck 11/21/19 10.3 -continue dapagliflozin given CAD and reduced EF -on metformin as well  CV risk counseling and secondary prevention -recommend heart healthy/Mediterranean diet, with whole grains, fruits, vegetable, fish, lean meats, nuts, and olive oil. Limit salt. -recommend moderate walking, 3-5 times/week for 30-50 minutes each session. Aim for at least 150 minutes.week. Goal should be pace of 3 miles/hours, or walking 1.5 miles in 30 minutes -recommend avoidance of tobacco products. Avoid excess alcohol. He has continued to remain tobacco  free since discharge from hospital  Follow up: 4 mos  Medication Adjustments/Labs and Tests Ordered: Current medicines are reviewed at length with the patient today.  Concerns regarding medicines are outlined above.  No orders of the defined types were placed in this encounter.  No orders of the defined types were placed in this encounter.   Patient Instructions  Medication Instructions:  Aspirin daily for life--for vessel/stent protection Ticagrelor (brilinta) twice daily for a total of 1 year, finish 08/2020--for stent protection Atorvastatin for life--for cholesterol/vessel protection Carvedilol twice daily, losartan daily--long term, for heart protection and blood pressure control Dapagliflozin (farxiga)--for both heart heart/protection and diabetes  *If you need a refill on your cardiac medications before your next appointment, please call your pharmacy*   Lab Work: None   Testing/Procedures: None   Follow-Up: At [facility], you and your health needs are our priority.  As part of our continuing mission to provide you with exceptional heart care, we have created designated Provider Care Teams.  These Care Teams include your primary Cardiologist (physician) and Advanced Practice Providers (APPs -  Physician Assistants and Nurse Practitioners) who all work together to provide you with the care you need, when you need  it.  We recommend signing up for the patient portal called "MyChart".  Sign up information is provided on this After Visit Summary.  MyChart is used to connect with patients for Virtual Visits (Telemedicine).  Patients are able to view lab/test results, encounter notes, upcoming appointments, etc.  Non-urgent messages can be sent to your provider as well.   To learn more about what you can do with MyChart, go to [email or web address].    Your next appointment:   4 month(s)  The format for your next appointment:   In Person  Provider:   Jodelle Red, MD     Mediterranean Diet A Mediterranean diet refers to food and lifestyle choices that are based on the traditions of countries located on the [place]. This way of eating has been shown to help prevent certain conditions and improve outcomes for people who have chronic diseases, like kidney disease and heart disease. What are tips for following this plan? Lifestyle  Cook and eat meals together with your family, when possible.  Drink enough fluid to keep your urine clear or pale yellow.  Be physically active every day. This includes: ? Aerobic exercise like running or swimming. ? Leisure activities like gardening, walking, or housework.  Get 7-8 hours of sleep each night.  If recommended by your health care provider, drink Barker wine in moderation. This means 1 glass a day for nonpregnant women and 2 glasses a day for men. A glass of wine equals 5 oz (150 mL). Reading food labels   Check the serving size of packaged foods. For foods such as rice and pasta, the serving size refers to the amount of cooked product, not dry.  Check the total fat in packaged foods. Avoid foods that have saturated fat or trans fats.  Check the ingredients list for added sugars, such as corn syrup. Shopping  At the grocery store, buy most of your food from the areas near the walls of the store. This includes: ? Fresh fruits and vegetables (produce). ? Grains, beans, nuts, and seeds. Some of these may be available in unpackaged forms or large amounts (in bulk). ? Fresh seafood. ? Poultry and eggs. ? Low-fat dairy products.  Buy whole ingredients instead of prepackaged foods.  Buy fresh fruits and vegetables in-season from local farmers markets.  Buy frozen fruits and vegetables in resealable bags.  If you do not have access to quality fresh seafood, buy precooked frozen shrimp or canned fish, such as tuna, salmon, or sardines.  Buy small amounts of raw or cooked vegetables,  salads, or olives from the deli or salad bar at your store.  Stock your pantry so you always have certain foods on hand, such as olive oil, canned tuna, canned tomatoes, rice, pasta, and beans. Cooking  Cook foods with extra-virgin olive oil instead of using butter or other vegetable oils.  Have meat as a side dish, and have vegetables or grains as your main dish. This means having meat in small portions or adding small amounts of meat to foods like pasta or stew.  Use beans or vegetables instead of meat in common dishes like chili or lasagna.  Experiment with different cooking methods. Try roasting or broiling vegetables instead of steaming or sauteing them.  Add frozen vegetables to soups, stews, pasta, or rice.  Add nuts or seeds for added healthy fat at each meal. You can add these to yogurt, salads, or vegetable dishes.  Marinate fish or vegetables using olive oil, lemon juice,  garlic, and fresh herbs. Meal planning   Plan to eat 1 vegetarian meal one day each week. Try to work up to 2 vegetarian meals, if possible.  Eat seafood 2 or more times a week.  Have healthy snacks readily available, such as: ? Vegetable sticks with hummus. ? [place] yogurt. ? Fruit and nut trail mix.  Eat balanced meals throughout the week. This includes: ? Fruit: 2-3 servings a day ? Vegetables: 4-5 servings a day ? Low-fat dairy: 2 servings a day ? Fish, poultry, or lean meat: 1 serving a day ? Beans and legumes: 2 or more servings a week ? Nuts and seeds: 1-2 servings a day ? Whole grains: 6-8 servings a day ? Extra-virgin olive oil: 3-4 servings a day  Limit Barker meat and sweets to only a few servings a month What are my food choices?  Mediterranean diet ? Recommended  Grains: Whole-grain pasta. Brown rice. Bulgar wheat. Polenta. Couscous. Whole-wheat bread. Orpah Cobbatmeal. Quinoa.  Vegetables: Artichokes. Beets. Broccoli. Cabbage. Carrots. Eggplant. Green beans. Chard. Kale. Spinach. Onions.  Leeks. Peas. Squash. Tomatoes. Peppers. Radishes.  Fruits: Apples. Apricots. Avocado. Berries. Bananas. Cherries. Dates. Figs. Grapes. Lemons. Melon. Oranges. Peaches. Plums. Pomegranate.  Meats and other protein foods: Beans. Almonds. Sunflower seeds. Pine nuts. Peanuts. Cod. Salmon. Scallops. Shrimp. Tuna. Tilapia. Clams. Oysters. Eggs.  Dairy: Low-fat milk. Cheese. Greek yogurt.  Beverages: Water. Barker wine. Herbal tea.  Fats and oils: Extra virgin olive oil. Avocado oil. Grape seed oil.  Sweets and desserts: [place] yogurt with honey. Baked apples. Poached pears. Trail mix.  Seasoning and other foods: Basil. Cilantro. Coriander. Cumin. Mint. Parsley. Sage. Rosemary. Tarragon. Garlic. Oregano. Thyme. Pepper. Balsalmic vinegar. Tahini. Hummus. Tomato sauce. Olives. Mushrooms. ? Limit these  Grains: Prepackaged pasta or rice dishes. Prepackaged cereal with added sugar.  Vegetables: Deep fried potatoes (french fries).  Fruits: Fruit canned in syrup.  Meats and other protein foods: Beef. Pork. Lamb. Poultry with skin. Hot dogs. Tomasa BlaseBacon.  Dairy: Ice cream. Sour cream. Whole milk.  Beverages: Juice. Sugar-sweetened soft drinks. Beer. Liquor and spirits.  Fats and oils: Butter. Canola oil. Vegetable oil. Beef fat (tallow). Lard.  Sweets and desserts: Cookies. Cakes. Pies. Candy.  Seasoning and other foods: Mayonnaise. Premade sauces and marinades. The items listed may not be a complete list. Talk with your dietitian about what dietary choices are right for you. Summary  The Mediterranean diet includes both food and lifestyle choices.  Eat a variety of fresh fruits and vegetables, beans, nuts, seeds, and whole grains.  Limit the amount of Barker meat and sweets that you eat.  Talk with your health care provider about whether it is safe for you to drink Barker wine in moderation. This means 1 glass a day for nonpregnant women and 2 glasses a day for men. A glass of wine equals 5 oz (150  mL). This information is not intended to replace advice given to you by your health care provider. Make sure you discuss any questions you have with your health care provider. Document Revised: 01/13/2016 Document Reviewed: 01/06/2016 Elsevier Patient Education  2020 [employer]     Signed, Kirk RedBridgette Afiya Ferrebee, MD  12/04/2019   [facility]

## 2019-12-04 NOTE — Patient Instructions (Addendum)
Medication Instructions:  Aspirin daily for life--for vessel/stent protection Ticagrelor (brilinta) twice daily for a total of 1 year, finish 08/2020--for stent protection Atorvastatin for life--for cholesterol/vessel protection Carvedilol twice daily, losartan daily--long term, for heart protection and blood pressure control Dapagliflozin (farxiga)--for both heart heart/protection and diabetes  *If you need a refill on your cardiac medications before your next appointment, please call your pharmacy*   Lab Work: None   Testing/Procedures: None   Follow-Up: At [facility], you and your health needs are our priority.  As part of our continuing mission to provide you with exceptional heart care, we have created designated Provider Care Teams.  These Care Teams include your primary Cardiologist (physician) and Advanced Practice Providers (APPs -  Physician Assistants and Nurse Practitioners) who all work together to provide you with the care you need, when you need it.  We recommend signing up for the patient portal called "MyChart".  Sign up information is provided on this After Visit Summary.  MyChart is used to connect with patients for Virtual Visits (Telemedicine).  Patients are able to view lab/test results, encounter notes, upcoming appointments, etc.  Non-urgent messages can be sent to your provider as well.   To learn more about what you can do with MyChart, go to [email or web address].    Your next appointment:   4 month(s)  The format for your next appointment:   In Person  Provider:   Jodelle Red, MD     Mediterranean Diet A Mediterranean diet refers to food and lifestyle choices that are based on the traditions of countries located on the [place]. This way of eating has been shown to help prevent certain conditions and improve outcomes for people who have chronic diseases, like kidney disease and heart disease. What are tips for following this  plan? Lifestyle  Cook and eat meals together with your family, when possible.  Drink enough fluid to keep your urine clear or pale yellow.  Be physically active every day. This includes: ? Aerobic exercise like running or swimming. ? Leisure activities like gardening, walking, or housework.  Get 7-8 hours of sleep each night.  If recommended by your health care provider, drink red wine in moderation. This means 1 glass a day for nonpregnant women and 2 glasses a day for men. A glass of wine equals 5 oz (150 mL). Reading food labels   Check the serving size of packaged foods. For foods such as rice and pasta, the serving size refers to the amount of cooked product, not dry.  Check the total fat in packaged foods. Avoid foods that have saturated fat or trans fats.  Check the ingredients list for added sugars, such as corn syrup. Shopping  At the grocery store, buy most of your food from the areas near the walls of the store. This includes: ? Fresh fruits and vegetables (produce). ? Grains, beans, nuts, and seeds. Some of these may be available in unpackaged forms or large amounts (in bulk). ? Fresh seafood. ? Poultry and eggs. ? Low-fat dairy products.  Buy whole ingredients instead of prepackaged foods.  Buy fresh fruits and vegetables in-season from local farmers markets.  Buy frozen fruits and vegetables in resealable bags.  If you do not have access to quality fresh seafood, buy precooked frozen shrimp or canned fish, such as tuna, salmon, or sardines.  Buy small amounts of raw or cooked vegetables, salads, or olives from the deli or salad bar at your store.  Stock Water quality scientist  so you always have certain foods on hand, such as olive oil, canned tuna, canned tomatoes, rice, pasta, and beans. Cooking  Cook foods with extra-virgin olive oil instead of using butter or other vegetable oils.  Have meat as a side dish, and have vegetables or grains as your main dish. This  means having meat in small portions or adding small amounts of meat to foods like pasta or stew.  Use beans or vegetables instead of meat in common dishes like chili or lasagna.  Experiment with different cooking methods. Try roasting or broiling vegetables instead of steaming or sauteing them.  Add frozen vegetables to soups, stews, pasta, or rice.  Add nuts or seeds for added healthy fat at each meal. You can add these to yogurt, salads, or vegetable dishes.  Marinate fish or vegetables using olive oil, lemon juice, garlic, and fresh herbs. Meal planning   Plan to eat 1 vegetarian meal one day each week. Try to work up to 2 vegetarian meals, if possible.  Eat seafood 2 or more times a week.  Have healthy snacks readily available, such as: ? Vegetable sticks with hummus. ? [place] yogurt. ? Fruit and nut trail mix.  Eat balanced meals throughout the week. This includes: ? Fruit: 2-3 servings a day ? Vegetables: 4-5 servings a day ? Low-fat dairy: 2 servings a day ? Fish, poultry, or lean meat: 1 serving a day ? Beans and legumes: 2 or more servings a week ? Nuts and seeds: 1-2 servings a day ? Whole grains: 6-8 servings a day ? Extra-virgin olive oil: 3-4 servings a day  Limit red meat and sweets to only a few servings a month What are my food choices?  Mediterranean diet ? Recommended  Grains: Whole-grain pasta. Brown rice. Bulgar wheat. Polenta. Couscous. Whole-wheat bread. Orpah Cobb.  Vegetables: Artichokes. Beets. Broccoli. Cabbage. Carrots. Eggplant. Green beans. Chard. Kale. Spinach. Onions. Leeks. Peas. Squash. Tomatoes. Peppers. Radishes.  Fruits: Apples. Apricots. Avocado. Berries. Bananas. Cherries. Dates. Figs. Grapes. Lemons. Melon. Oranges. Peaches. Plums. Pomegranate.  Meats and other protein foods: Beans. Almonds. Sunflower seeds. Pine nuts. Peanuts. Cod. Salmon. Scallops. Shrimp. Tuna. Tilapia. Clams. Oysters. Eggs.  Dairy: Low-fat milk. Cheese.  Greek yogurt.  Beverages: Water. Red wine. Herbal tea.  Fats and oils: Extra virgin olive oil. Avocado oil. Grape seed oil.  Sweets and desserts: [place] yogurt with honey. Baked apples. Poached pears. Trail mix.  Seasoning and other foods: Basil. Cilantro. Coriander. Cumin. Mint. Parsley. Sage. Rosemary. Tarragon. Garlic. Oregano. Thyme. Pepper. Balsalmic vinegar. Tahini. Hummus. Tomato sauce. Olives. Mushrooms. ? Limit these  Grains: Prepackaged pasta or rice dishes. Prepackaged cereal with added sugar.  Vegetables: Deep fried potatoes (french fries).  Fruits: Fruit canned in syrup.  Meats and other protein foods: Beef. Pork. Lamb. Poultry with skin. Hot dogs. Tomasa Blase.  Dairy: Ice cream. Sour cream. Whole milk.  Beverages: Juice. Sugar-sweetened soft drinks. Beer. Liquor and spirits.  Fats and oils: Butter. Canola oil. Vegetable oil. Beef fat (tallow). Lard.  Sweets and desserts: Cookies. Cakes. Pies. Candy.  Seasoning and other foods: Mayonnaise. Premade sauces and marinades. The items listed may not be a complete list. Talk with your dietitian about what dietary choices are right for you. Summary  The Mediterranean diet includes both food and lifestyle choices.  Eat a variety of fresh fruits and vegetables, beans, nuts, seeds, and whole grains.  Limit the amount of red meat and sweets that you eat.  Talk with your health care provider about whether  it is safe for you to drink red wine in moderation. This means 1 glass a day for nonpregnant women and 2 glasses a day for men. A glass of wine equals 5 oz (150 mL). This information is not intended to replace advice given to you by your health care provider. Make sure you discuss any questions you have with your health care provider. Document Revised: 01/13/2016 Document Reviewed: 01/06/2016 Elsevier Patient Education  2020 [employer].

## 2019-12-09 ENCOUNTER — Other Ambulatory Visit: Payer: Self-pay | Admitting: Student

## 2019-12-09 DIAGNOSIS — I2510 Atherosclerotic heart disease of native coronary artery without angina pectoris: Secondary | ICD-10-CM | POA: Diagnosis not present

## 2019-12-09 DIAGNOSIS — I255 Ischemic cardiomyopathy: Secondary | ICD-10-CM | POA: Diagnosis not present

## 2019-12-09 DIAGNOSIS — I10 Essential (primary) hypertension: Secondary | ICD-10-CM | POA: Diagnosis not present

## 2019-12-09 DIAGNOSIS — E785 Hyperlipidemia, unspecified: Secondary | ICD-10-CM | POA: Diagnosis not present

## 2019-12-09 LAB — LIPID PANEL
Chol/HDL Ratio: 2.9 ratio (ref 0.0–5.0)
Cholesterol, Total: 84 mg/dL — ABNORMAL LOW (ref 100–199)
HDL: 29 mg/dL — ABNORMAL LOW (ref >39)
LDL Chol Calc (NIH): 30 mg/dL (ref 0–99)
Triglycerides: 144 mg/dL (ref 0–149)
VLDL Cholesterol Cal: 25 mg/dL (ref 5–40)

## 2019-12-09 LAB — HEPATIC FUNCTION PANEL
ALT: 29 IU/L (ref 0–44)
AST: 22 IU/L (ref 0–40)
Albumin: 4.7 g/dL (ref 4.0–5.0)
Alkaline Phosphatase: 85 IU/L (ref 48–121)
Bilirubin Total: 0.4 mg/dL (ref 0.0–1.2)
Bilirubin, Direct: 0.13 mg/dL (ref 0.00–0.40)
Total Protein: 7.7 g/dL (ref 6.0–8.5)

## 2019-12-16 ENCOUNTER — Ambulatory Visit: Payer: BC Managed Care – PPO | Admitting: Family Medicine

## 2019-12-18 ENCOUNTER — Other Ambulatory Visit: Payer: Self-pay

## 2019-12-18 MED ORDER — NITROGLYCERIN 0.4 MG SL SUBL: 0.4000 mg | SUBLINGUAL_TABLET | SUBLINGUAL | 1 refills | Status: DC | PRN

## 2019-12-18 NOTE — Telephone Encounter (Signed)
This is Dr. Christopher's pt 

## 2019-12-19 ENCOUNTER — Encounter: Payer: Self-pay | Admitting: Gastroenterology

## 2019-12-19 ENCOUNTER — Ambulatory Visit (INDEPENDENT_AMBULATORY_CARE_PROVIDER_SITE_OTHER): Payer: BC Managed Care – PPO | Admitting: Family Medicine

## 2019-12-19 ENCOUNTER — Encounter: Payer: Self-pay | Admitting: Family Medicine

## 2019-12-19 ENCOUNTER — Other Ambulatory Visit: Payer: Self-pay

## 2019-12-19 VITALS — BP 110/70 | HR 93 | Temp 98.4°F | Ht 72.0 in | Wt 225.1 lb

## 2019-12-19 DIAGNOSIS — E1159 Type 2 diabetes mellitus with other circulatory complications: Secondary | ICD-10-CM | POA: Diagnosis not present

## 2019-12-19 DIAGNOSIS — E1165 Type 2 diabetes mellitus with hyperglycemia: Secondary | ICD-10-CM

## 2019-12-19 DIAGNOSIS — K219 Gastro-esophageal reflux disease without esophagitis: Secondary | ICD-10-CM

## 2019-12-19 DIAGNOSIS — IMO0002 Reserved for concepts with insufficient information to code with codable children: Secondary | ICD-10-CM

## 2019-12-19 NOTE — Progress Notes (Signed)
Established Patient Office Visit  Subjective:  [name], male    DOB: March 26, 1976  Age: 44 y.o. MRN: [id number]  CC:  Chief Complaint  Patient presents with  . Gastroesophageal Reflux    HPI Kirk Barker presents for longstanding history of persistent daily reflux symptoms.  He just recently establish care.  Refer to note from 11/21/2019 for details.  He had NSTEMI on 09/13/2019 treated with drug-eluting stent to the ramus branch.  He had out-of-control diabetes and was started on a couple medications.  No recent chest pains.  He is here today to discuss chronic epigastric pain.  He states he has daily burning reflux type symptoms independent of what he eats.  He has noted specific foods like tomatoes, citric acid foods, and fatty foods seem to be worse.  We tried Protonix last visit and he is not sure he is taking this.  He states he has tried multiple PPIs in the past and none of them have worked.  He states the only thing that seem to work very well with Zantac.  He tried Pepcid and Tagamet without relief.  He relates has been to multiple centers including [facility], [facility], [place], and somewhere in [place] to various GI clinics.  He has had at least 2 and possibly 3 EGDs in the past he thinks most recently 2015.  He denies any dysphagia.  No hematemesis.  No melena.  No appetite or weight changes.  Past Medical History:  Diagnosis Date  . Median arcuate ligament syndrome [facility])     Past Surgical History:  Procedure Laterality Date  . CHOLECYSTECTOMY    . LEFT HEART CATH AND CORONARY ANGIOGRAPHY N/A 09/15/2019   Procedure: LEFT HEART CATH AND CORONARY ANGIOGRAPHY;  Surgeon: Lyn Records, MD;  Location: [facility] INVASIVE CV LAB;  Service: Cardiovascular;  Laterality: N/A;    Family History  Problem Relation Age of Onset  . Stroke Mother   . Diabetes Father   . Brain cancer Maternal Grandmother     Social History   Socioeconomic History  . Marital status: Married      Spouse name: Not on file  . Number of children: Not on file  . Years of education: Not on file  . Highest education level: Not on file  Occupational History    Employer: [employer]  Tobacco Use  . Smoking status: Former Smoker    Packs/day: 0.50    Types: Cigarettes    Quit date: 09/16/2019    Years since quitting: 0.2  . Smokeless tobacco: Never Used  Vaping Use  . Vaping Use: Never used  Substance and Sexual Activity  . Alcohol use: No  . Drug use: No  . Sexual activity: Yes  Other Topics Concern  . Not on file  Social History Narrative  . Not on file   Social Determinants of Health   Financial Resource Strain:   . Difficulty of Paying Living Expenses:   Food Insecurity:   . Worried About Programme researcher, broadcasting/film/video in the Last Year:   . Barista in the Last Year:   Transportation Needs:   . Freight forwarder (Medical):   Marland Kitchen Lack of Transportation (Non-Medical):   Physical Activity:   . Days of Exercise per Week:   . Minutes of Exercise per Session:   Stress:   . Feeling of Stress :   Social Connections:   . Frequency of Communication with Friends and Family:   .  Frequency of Social Gatherings with Friends and Family:   . Attends Religious Services:   . Active Member of Clubs or Organizations:   . Attends Banker Meetings:   Marland Kitchen Marital Status:   Intimate Partner Violence:   . Fear of Current or Ex-Partner:   . Emotionally Abused:   Marland Kitchen Physically Abused:   . Sexually Abused:     Outpatient Medications Prior to Visit  Medication Sig Dispense Refill  . aspirin 81 MG chewable tablet Chew 1 tablet (81 mg total) by mouth daily.    Marland Kitchen atorvastatin (LIPITOR) 80 MG tablet TAKE 1 TABLET (80 MG TOTAL) BY MOUTH DAILY AT 6 PM. 90 tablet 3  . carvedilol (COREG) 25 MG tablet TAKE 1 TABLET (25 MG TOTAL) BY MOUTH 2 (TWO) TIMES DAILY WITH A MEAL. 180 tablet 3  . dapagliflozin propanediol (FARXIGA) 10 MG TABS tablet Take 10 mg by mouth daily before breakfast.  90 tablet 3  . losartan (COZAAR) 50 MG tablet Take 1 tablet (50 mg total) by mouth daily. 90 tablet 3  . metFORMIN (GLUCOPHAGE) 500 MG tablet Take two tablets by mouth twice daily 360 tablet 3  . nitroGLYCERIN (NITROSTAT) 0.4 MG SL tablet Place 1 tablet (0.4 mg total) under the tongue every 5 (five) minutes as needed for chest pain. 25 tablet 1  . pantoprazole (PROTONIX) 40 MG tablet Take 1 tablet (40 mg total) by mouth daily. 90 tablet 3  . ticagrelor (BRILINTA) 90 MG TABS tablet Take 1 tablet (90 mg total) by mouth 2 (two) times daily. 90 tablet 7   No facility-administered medications prior to visit.    Allergies  Allergen Reactions  . Bee Venom   . Cat Hair Extract Anxiety, Itching and Other (See Comments)    Itching , watering eyes  Itching , watering eyes  Itching , watering eyes  Itching , watering eyes    . Other Itching    ROS Review of Systems  Constitutional: Negative for appetite change and unexpected weight change.  HENT: Negative for trouble swallowing.   Respiratory: Negative for cough and shortness of breath.   Cardiovascular: Negative for chest pain.  Gastrointestinal: Negative for blood in stool, nausea and vomiting.  Genitourinary: Negative for dysuria.  Musculoskeletal: Negative for back pain.  Neurological: Negative for weakness.  Hematological: Negative for adenopathy.      Objective:    Physical Exam Vitals reviewed.  Constitutional:      Appearance: Normal appearance.  Cardiovascular:     Rate and Rhythm: Normal rate and regular rhythm.  Pulmonary:     Effort: Pulmonary effort is normal.     Breath sounds: Normal breath sounds.  Abdominal:     Palpations: Abdomen is soft.     Tenderness: There is no abdominal tenderness.  Neurological:     Mental Status: He is alert.     BP 110/70   Pulse 93   Temp 98.4 F (36.9 C) (Oral)   Ht 6' (1.829 m)   Wt (!) 225 lb 2 oz (102.1 kg)   SpO2 98%   BMI 30.53 kg/m  Wt Readings from Last 3  Encounters:  12/19/19 (!) 225 lb 2 oz (102.1 kg)  12/04/19 232 lb (105.2 kg)  11/21/19 226 lb 14.4 oz (102.9 kg)     Health Maintenance Due  Topic Date Due  . Hepatitis C Screening  Never done  . PNEUMOCOCCAL POLYSACCHARIDE VACCINE AGE 74-64 HIGH RISK  Never done  . FOOT EXAM  Never  done  . OPHTHALMOLOGY EXAM  Never done  . COVID-19 Vaccine (1) Never done  . TETANUS/TDAP  Never done    There are no preventive care reminders to display for this patient.  Lab Results  Component Value Date   TSH 0.847 09/13/2019   Lab Results  Component Value Date   WBC 9.8 09/16/2019   HGB 14.6 09/16/2019   HCT 41.3 09/16/2019   MCV 91.6 09/16/2019   PLT 262 09/16/2019   Lab Results  Component Value Date   NA 133 (L) 09/26/2019   K 5.1 09/26/2019   CO2 20 09/26/2019   GLUCOSE 304 (H) 09/26/2019   BUN 13 09/26/2019   CREATININE 1.22 09/26/2019   BILITOT 0.4 12/09/2019   ALKPHOS 85 12/09/2019   AST 22 12/09/2019   ALT 29 12/09/2019   PROT 7.7 12/09/2019   ALBUMIN 4.7 12/09/2019   CALCIUM 10.0 09/26/2019   ANIONGAP 12 09/16/2019   Lab Results  Component Value Date   CHOL 84 (L) 12/09/2019   Lab Results  Component Value Date   HDL 29 (L) 12/09/2019   Lab Results  Component Value Date   LDLCALC 30 12/09/2019   Lab Results  Component Value Date   TRIG 144 12/09/2019   Lab Results  Component Value Date   CHOLHDL 2.9 12/09/2019   Lab Results  Component Value Date   HGBA1C 10.3 (A) 11/21/2019      Assessment & Plan:   #1 GERD.  He has had extensive work-ups previously and has tried multiple things including multiple PPIs, H2 blockers, and Carafate all without relief.  He states his last EGD was estimated 2015.  We do not have records currently.  He does not any red flags such as appetite or weight change  -Set up GI referral.  We have encouraged him to get back on Protonix 40 mg daily.  He will also consider trial of over-the-counter Axid 150 mg twice daily  #2 type  2 diabetes.  Recently diagnosed and poorly controlled.  He has follow-up in August and recheck A1c then.  Will titrate medications then as necessary  No orders of the defined types were placed in this encounter.   Follow-up: No follow-ups on file.    Evelena Peat, MD

## 2019-12-19 NOTE — Patient Instructions (Signed)
Food Choices for Gastroesophageal Reflux Disease, Adult When you have gastroesophageal reflux disease (GERD), the foods you eat and your eating habits are very important. Choosing the right foods can help ease the discomfort of GERD. Consider working with a diet and nutrition specialist (dietitian) to help you make healthy food choices. What general guidelines should I follow?  Eating plan  Choose healthy foods low in fat, such as fruits, vegetables, whole grains, low-fat dairy products, and lean meat, fish, and poultry.  Eat frequent, small meals instead of three large meals each day. Eat your meals slowly, in a relaxed setting. Avoid bending over or lying down until 2-3 hours after eating.  Limit high-fat foods such as fatty meats or fried foods.  Limit your intake of oils, butter, and shortening to less than 8 teaspoons each day.  Avoid the following: ? Foods that cause symptoms. These may be different for different people. Keep a food diary to keep track of foods that cause symptoms. ? Alcohol. ? Drinking large amounts of liquid with meals. ? Eating meals during the 2-3 hours before bed.  Cook foods using methods other than frying. This may include baking, grilling, or broiling. Lifestyle  Maintain a healthy weight. Ask your health care provider what weight is healthy for you. If you need to lose weight, work with your health care provider to do so safely.  Exercise for at least 30 minutes on 5 or more days each week, or as told by your health care provider.  Avoid wearing clothes that fit tightly around your waist and chest.  Do not use any products that contain nicotine or tobacco, such as cigarettes and e-cigarettes. If you need help quitting, ask your health care provider.  Sleep with the head of your bed raised. Use a wedge under the mattress or blocks under the bed frame to raise the head of the bed. What foods are not recommended? The items listed may not be a complete  list. Talk with your dietitian about what dietary choices are best for you. Grains Pastries or quick breads with added fat. French toast. Vegetables Deep fried vegetables. French fries. Any vegetables prepared with added fat. Any vegetables that cause symptoms. For some people this may include tomatoes and tomato products, chili peppers, onions and garlic, and horseradish. Fruits Any fruits prepared with added fat. Any fruits that cause symptoms. For some people this may include citrus fruits, such as oranges, grapefruit, pineapple, and lemons. Meats and other protein foods High-fat meats, such as fatty beef or pork, hot dogs, ribs, ham, sausage, salami and bacon. Fried meat or protein, including fried fish and fried chicken. Nuts and nut butters. Dairy Whole milk and chocolate milk. Sour cream. Cream. Ice cream. Cream cheese. Milk shakes. Beverages Coffee and tea, with or without caffeine. Carbonated beverages. Sodas. Energy drinks. Fruit juice made with acidic fruits (such as orange or grapefruit). Tomato juice. Alcoholic drinks. Fats and oils Butter. Margarine. Shortening. Ghee. Sweets and desserts Chocolate and cocoa. Donuts. Seasoning and other foods Pepper. Peppermint and spearmint. Any condiments, herbs, or seasonings that cause symptoms. For some people, this may include curry, hot sauce, or vinegar-based salad dressings. Summary  When you have gastroesophageal reflux disease (GERD), food and lifestyle choices are very important to help ease the discomfort of GERD.  Eat frequent, small meals instead of three large meals each day. Eat your meals slowly, in a relaxed setting. Avoid bending over or lying down until 2-3 hours after eating.  Limit high-fat   foods such as fatty meat or fried foods. This information is not intended to replace advice given to you by your health care provider. Make sure you discuss any questions you have with your health care provider. Document Revised:  09/05/2018 Document Reviewed: 05/16/2016 Elsevier Patient Education  2020 [employer].  We will set up GI referral    Consider trial of OTC Axid (Nizatidine) 150 mg twice daily.

## 2019-12-24 ENCOUNTER — Ambulatory Visit (INDEPENDENT_AMBULATORY_CARE_PROVIDER_SITE_OTHER): Payer: BC Managed Care – PPO | Admitting: Family Medicine

## 2019-12-24 VITALS — BP 128/78 | HR 77 | Temp 98.6°F | Ht 72.0 in | Wt 227.5 lb

## 2019-12-24 DIAGNOSIS — Z0000 Encounter for general adult medical examination without abnormal findings: Secondary | ICD-10-CM

## 2019-12-24 DIAGNOSIS — Z23 Encounter for immunization: Secondary | ICD-10-CM

## 2019-12-24 NOTE — Progress Notes (Signed)
Established Patient Office Visit  Subjective:  [name], male    DOB: 1975/12/23  Age: 44 y.o. MRN: [id number]  CC:  Chief Complaint  Patient presents with  . Annual Exam    pt has no new concerns     HPI Kirk Barker presents for physical exam.  He just recently establish care.  He has history of CAD, hypertension, recent NSTEMI.  He has type 2 diabetes which is just recently diagnosed.  Reported past history of pancreatitis.  Hyperlipidemia.  He had ongoing GERD and chronic epigastric pain.  Is had extensive GI work-up previously.  Was placed on Protonix but has not seen much improvement.  He has pending follow-up GI referral.  He started back on over-the-counter Zantac which seems to offer the most relief.  Diabetes was very poorly controlled.  He is currently on Farxiga and Metformin.  He will not be candidate for GLP-1 medication with past pancreatitis history.  Not checking blood sugars at home.  No polyuria or polydipsia.  Health maintenance reviewed  -No history of hepatitis C screening -No history of Pneumovax -He has not gotten Covid vaccine yet and is undecided -Has pending eye exam tomorrow   Past Medical History:  Diagnosis Date  . Median arcuate ligament syndrome [facility])     Past Surgical History:  Procedure Laterality Date  . CHOLECYSTECTOMY    . LEFT HEART CATH AND CORONARY ANGIOGRAPHY N/A 09/15/2019   Procedure: LEFT HEART CATH AND CORONARY ANGIOGRAPHY;  Surgeon: Lyn Records, MD;  Location: [facility];  Service: Cardiovascular;  Laterality: N/A;    Family History  Problem Relation Age of Onset  . Stroke Mother   . Diabetes Father   . Brain cancer Maternal Grandmother     Social History   Socioeconomic History  . Marital status: Married    Spouse name: Not on file  . Number of children: Not on file  . Years of education: Not on file  . Highest education level: Not on file  Occupational History    Employer: [employer]   Tobacco Use  . Smoking status: Former Smoker    Packs/day: 0.50    Types: Cigarettes    Quit date: 09/16/2019    Years since quitting: 0.2  . Smokeless tobacco: Never Used  Vaping Use  . Vaping Use: Never used  Substance and Sexual Activity  . Alcohol use: No  . Drug use: No  . Sexual activity: Yes  Other Topics Concern  . Not on file  Social History Narrative  . Not on file   Social Determinants of Health   Financial Resource Strain:   . Difficulty of Paying Living Expenses:   Food Insecurity:   . Worried About Programme researcher, broadcasting/film/video in the Last Year:   . Barista in the Last Year:   Transportation Needs:   . Freight forwarder (Medical):   Marland Kitchen Lack of Transportation (Non-Medical):   Physical Activity:   . Days of Exercise per Week:   . Minutes of Exercise per Session:   Stress:   . Feeling of Stress :   Social Connections:   . Frequency of Communication with Friends and Family:   . Frequency of Social Gatherings with Friends and Family:   . Attends Religious Services:   . Active Member of Clubs or Organizations:   . Attends Banker Meetings:   Marland Kitchen Marital Status:   Intimate Partner Violence:   .  Fear of Current or Ex-Partner:   . Emotionally Abused:   Marland Kitchen Physically Abused:   . Sexually Abused:     Outpatient Medications Prior to Visit  Medication Sig Dispense Refill  . aspirin 81 MG chewable tablet Chew 1 tablet (81 mg total) by mouth daily.    Marland Kitchen atorvastatin (LIPITOR) 80 MG tablet TAKE 1 TABLET (80 MG TOTAL) BY MOUTH DAILY AT 6 PM. 90 tablet 3  . carvedilol (COREG) 25 MG tablet TAKE 1 TABLET (25 MG TOTAL) BY MOUTH 2 (TWO) TIMES DAILY WITH A MEAL. 180 tablet 3  . dapagliflozin propanediol (FARXIGA) 10 MG TABS tablet Take 10 mg by mouth daily before breakfast. 90 tablet 3  . losartan (COZAAR) 50 MG tablet Take 1 tablet (50 mg total) by mouth daily. 90 tablet 3  . metFORMIN (GLUCOPHAGE) 500 MG tablet Take two tablets by mouth twice daily 360  tablet 3  . nitroGLYCERIN (NITROSTAT) 0.4 MG SL tablet Place 1 tablet (0.4 mg total) under the tongue every 5 (five) minutes as needed for chest pain. 25 tablet 1  . pantoprazole (PROTONIX) 40 MG tablet Take 1 tablet (40 mg total) by mouth daily. 90 tablet 3  . ticagrelor (BRILINTA) 90 MG TABS tablet Take 1 tablet (90 mg total) by mouth 2 (two) times daily. 90 tablet 7   No facility-administered medications prior to visit.    Allergies  Allergen Reactions  . Bee Venom   . Cat Hair Extract Anxiety, Itching and Other (See Comments)    Itching , watering eyes  Itching , watering eyes  Itching , watering eyes  Itching , watering eyes    . Other Itching    ROS Review of Systems  Constitutional: Negative for activity change, appetite change, fatigue, fever and unexpected weight change.  HENT: Negative for congestion, ear pain and trouble swallowing.   Eyes: Negative for pain and visual disturbance.  Respiratory: Negative for cough, shortness of breath and wheezing.   Cardiovascular: Negative for chest pain and palpitations.  Gastrointestinal: Negative for abdominal distention, blood in stool, constipation, diarrhea, nausea, rectal pain and vomiting.  Endocrine: Negative for polydipsia and polyuria.  Genitourinary: Negative for dysuria, hematuria and testicular pain.  Musculoskeletal: Negative for arthralgias and joint swelling.  Skin: Negative for rash.  Neurological: Negative for dizziness, syncope and headaches.  Hematological: Negative for adenopathy.  Psychiatric/Behavioral: Negative for confusion and dysphoric mood.      Objective:    Physical Exam Constitutional:      General: He is not in acute distress.    Appearance: He is well-developed.  HENT:     Head: Normocephalic and atraumatic.     Right Ear: External ear normal.     Left Ear: External ear normal.  Eyes:     Conjunctiva/sclera: Conjunctivae normal.     Pupils: Pupils are equal, round, and reactive to light.   Neck:     Thyroid: No thyromegaly.  Cardiovascular:     Rate and Rhythm: Normal rate and regular rhythm.     Heart sounds: Normal heart sounds. No murmur heard.   Pulmonary:     Effort: No respiratory distress.     Breath sounds: No wheezing or rales.  Abdominal:     General: Bowel sounds are normal. There is no distension.     Palpations: Abdomen is soft. There is no mass.     Tenderness: There is no abdominal tenderness. There is no guarding or rebound.  Musculoskeletal:     Cervical back:  Normal range of motion and neck supple.     Right lower leg: No edema.     Left lower leg: No edema.  Lymphadenopathy:     Cervical: No cervical adenopathy.  Skin:    Findings: No rash.  Neurological:     Mental Status: He is alert and oriented to person, place, and time.     Cranial Nerves: No cranial nerve deficit.     Deep Tendon Reflexes: Reflexes normal.     BP 128/78 (BP Location: Left Arm, Patient Position: Sitting, Cuff Size: Normal)   Pulse 77   Temp 98.6 F (37 C) (Oral)   Ht 6' (1.829 m)   Wt (!) 227 lb 8 oz (103.2 kg)   SpO2 96%   BMI 30.85 kg/m  Wt Readings from Last 3 Encounters:  12/24/19 (!) 227 lb 8 oz (103.2 kg)  12/19/19 (!) 225 lb 2 oz (102.1 kg)  12/04/19 232 lb (105.2 kg)     Health Maintenance Due  Topic Date Due  . Hepatitis C Screening  Never done  . PNEUMOCOCCAL POLYSACCHARIDE VACCINE AGE 27-64 HIGH RISK  Never done  . FOOT EXAM  Never done  . OPHTHALMOLOGY EXAM  Never done    There are no preventive care reminders to display for this patient.  Lab Results  Component Value Date   TSH 0.847 09/13/2019   Lab Results  Component Value Date   WBC 9.8 09/16/2019   HGB 14.6 09/16/2019   HCT 41.3 09/16/2019   MCV 91.6 09/16/2019   PLT 262 09/16/2019   Lab Results  Component Value Date   NA 133 (L) 09/26/2019   K 5.1 09/26/2019   CO2 20 09/26/2019   GLUCOSE 304 (H) 09/26/2019   BUN 13 09/26/2019   CREATININE 1.22 09/26/2019   BILITOT 0.4  12/09/2019   ALKPHOS 85 12/09/2019   AST 22 12/09/2019   ALT 29 12/09/2019   PROT 7.7 12/09/2019   ALBUMIN 4.7 12/09/2019   CALCIUM 10.0 09/26/2019   ANIONGAP 12 09/16/2019   Lab Results  Component Value Date   CHOL 84 (L) 12/09/2019   Lab Results  Component Value Date   HDL 29 (L) 12/09/2019   Lab Results  Component Value Date   LDLCALC 30 12/09/2019   Lab Results  Component Value Date   TRIG 144 12/09/2019   Lab Results  Component Value Date   CHOLHDL 2.9 12/09/2019   Lab Results  Component Value Date   HGBA1C 10.3 (A) 11/21/2019      Assessment & Plan:   Problem List Items Addressed This Visit    None    Visit Diagnoses    Physical exam    -  Primary   Relevant Orders   PSA   Hep C Antibody   Hemoglobin A1c    Multiple chronic problems as above.  Recent NSTEMI.  We recommend the following health maintenance issues  -Have advised yearly flu vaccine though not clear he will go through with that -We discussed recommendation for one-time Pneumovax prior to 65 based on his heart disease and diabetes history and he consents -Check hepatitis C antibody -Check PSA -Repeat A1c -eye exam scheduled for tomorrow -He will discuss with GI possible colonoscopy screening based on newer guidelines -We did not check CBC, chemistries, or lipids as he has had these recently  No orders of the defined types were placed in this encounter.   Follow-up: No follow-ups on file.    Evelena Peat, MD

## 2019-12-24 NOTE — Patient Instructions (Signed)
Preventive Care 41-46 Years Old, Male Preventive care refers to lifestyle choices and visits with your health care provider that can promote health and wellness. This includes:  A yearly physical exam. This is also called an annual well check.  Regular dental and eye exams.  Immunizations.  Screening for certain conditions.  Healthy lifestyle choices, such as eating a healthy diet, getting regular exercise, not using drugs or products that contain nicotine and tobacco, and limiting alcohol use. What can I expect for my preventive care visit? Physical exam Your health care provider will check:  Height and weight. These may be used to calculate body mass index (BMI), which is a measurement that tells if you are at a healthy weight.  Heart rate and blood pressure.  Your skin for abnormal spots. Counseling Your health care provider may ask you questions about:  Alcohol, tobacco, and drug use.  Emotional well-being.  Home and relationship well-being.  Sexual activity.  Eating habits.  Work and work Statistician. What immunizations do I need?  Influenza (flu) vaccine  This is recommended every year. Tetanus, diphtheria, and pertussis (Tdap) vaccine  You may need a Td booster every 10 years. Varicella (chickenpox) vaccine  You may need this vaccine if you have not already been vaccinated. Zoster (shingles) vaccine  You may need this after age 64. Measles, mumps, and rubella (MMR) vaccine  You may need at least one dose of MMR if you were born in 1957 or later. You may also need a second dose. Pneumococcal conjugate (PCV13) vaccine  You may need this if you have certain conditions and were not previously vaccinated. Pneumococcal polysaccharide (PPSV23) vaccine  You may need one or two doses if you smoke cigarettes or if you have certain conditions. Meningococcal conjugate (MenACWY) vaccine  You may need this if you have certain conditions. Hepatitis A  vaccine  You may need this if you have certain conditions or if you travel or work in places where you may be exposed to hepatitis A. Hepatitis B vaccine  You may need this if you have certain conditions or if you travel or work in places where you may be exposed to hepatitis B. Haemophilus influenzae type b (Hib) vaccine  You may need this if you have certain risk factors. Human papillomavirus (HPV) vaccine  If recommended by your health care provider, you may need three doses over 6 months. You may receive vaccines as individual doses or as more than one vaccine together in one shot (combination vaccines). Talk with your health care provider about the risks and benefits of combination vaccines. What tests do I need? Blood tests  Lipid and cholesterol levels. These may be checked every 5 years, or more frequently if you are over 46 years old.  Hepatitis C test.  Hepatitis B test. Screening  Lung cancer screening. You may have this screening every year starting at age 46 if you have a 30-pack-year history of smoking and currently smoke or have quit within the past 15 years.  Prostate cancer screening. Recommendations will vary depending on your family history and other risks.  Colorectal cancer screening. All adults should have this screening starting at age 46 and continuing until age 2. Your health care provider may recommend screening at age 14 if you are at increased risk. You will have tests every 1-10 years, depending on your results and the type of screening test.  Diabetes screening. This is done by checking your blood sugar (glucose) after you have not eaten  for a while (fasting). You may have this done every 1-3 years.  Sexually transmitted disease (STD) testing. Follow these instructions at home: Eating and drinking  Eat a diet that includes fresh fruits and vegetables, whole grains, lean protein, and low-fat dairy products.  Take vitamin and mineral supplements as  recommended by your health care provider.  Do not drink alcohol if your health care provider tells you not to drink.  If you drink alcohol: ? Limit how much you have to 0-2 drinks a day. ? Be aware of how much alcohol is in your drink. In the [place] one drink equals one 12 oz bottle of beer (355 mL), one 5 oz glass of wine (148 mL), or one 1 oz glass of hard liquor (44 mL). Lifestyle  Take daily care of your teeth and gums.  Stay active. Exercise for at least 30 minutes on 5 or more days each week.  Do not use any products that contain nicotine or tobacco, such as cigarettes, e-cigarettes, and chewing tobacco. If you need help quitting, ask your health care provider.  If you are sexually active, practice safe sex. Use a condom or other form of protection to prevent STIs (sexually transmitted infections).  Talk with your health care provider about taking a low-dose aspirin every day starting at age 46. What's next?  Go to your health care provider once a year for a well check visit.  Ask your health care provider how often you should have your eyes and teeth checked.  Stay up to date on all vaccines. This information is not intended to replace advice given to you by your health care provider. Make sure you discuss any questions you have with your health care provider. Document Revised: 05/09/2018 Document Reviewed: 05/09/2018 Elsevier Patient Education  2020 [place] with GI possible colonoscopy screening- if covered by insurance

## 2019-12-25 DIAGNOSIS — Z7984 Long term (current) use of oral hypoglycemic drugs: Secondary | ICD-10-CM | POA: Diagnosis not present

## 2019-12-25 DIAGNOSIS — H5203 Hypermetropia, bilateral: Secondary | ICD-10-CM | POA: Diagnosis not present

## 2019-12-25 DIAGNOSIS — H52223 Regular astigmatism, bilateral: Secondary | ICD-10-CM | POA: Diagnosis not present

## 2019-12-25 DIAGNOSIS — E113393 Type 2 diabetes mellitus with moderate nonproliferative diabetic retinopathy without macular edema, bilateral: Secondary | ICD-10-CM | POA: Diagnosis not present

## 2019-12-25 LAB — HM DIABETES EYE EXAM

## 2020-01-01 ENCOUNTER — Encounter: Payer: Self-pay | Admitting: Podiatry

## 2020-01-01 ENCOUNTER — Ambulatory Visit (INDEPENDENT_AMBULATORY_CARE_PROVIDER_SITE_OTHER): Payer: BC Managed Care – PPO | Admitting: Podiatry

## 2020-01-01 ENCOUNTER — Other Ambulatory Visit: Payer: Self-pay

## 2020-01-01 DIAGNOSIS — L84 Corns and callosities: Secondary | ICD-10-CM

## 2020-01-01 DIAGNOSIS — E119 Type 2 diabetes mellitus without complications: Secondary | ICD-10-CM | POA: Insufficient documentation

## 2020-01-01 NOTE — Progress Notes (Signed)
This patient presents the office with chief complaint of the skin lesion between the fourth and fifth toes left foot.  He says this has been developing for the last 2 to 3 months and is painful walking wearing his shoes.  He states he has removed pieces of callus from between the toes himself.  He mentioned it to his medical doctor who recommended he be evaluated and treated in this office.  This patient is concerned since he is diabetic.  He has provided no self treatment for this condition.  He presents the office today for an evaluation and treatment.     Vascular  Dorsalis pedis and posterior tibial pulses are palpable  B/L.  Capillary return  WNL.  Temperature gradient is  WNL.  Skin turgor  WNL  Sensorium  Senn Weinstein monofilament wire  WNL. Normal tactile sensation.  Nail Exam  Patient has normal nails with no evidence of bacterial or fungal infection.  Orthopedic  Exam  Muscle tone and muscle strength  WNL.  No limitations of motion feet  B/L.  No crepitus or joint effusion noted.  Foot type is unremarkable and digits show no abnormalities.  HAV  B/L.  Pes planus foot type.  Skin  No open lesions.  Normal skin texture and turgor.  Heloma molle 4/5 left foot.   Heloma molle 4/5 left foot.  IE  Debride skin lesion left foot.  Dispersion padding dispensed.  Discussed this condition with this patient.  RTC 1 year for annual foot exam.  RTC for treatment of HM left foot.   Helane Gunther DPM

## 2020-01-07 ENCOUNTER — Ambulatory Visit (INDEPENDENT_AMBULATORY_CARE_PROVIDER_SITE_OTHER): Payer: BC Managed Care – PPO | Admitting: Family Medicine

## 2020-01-07 ENCOUNTER — Encounter: Payer: Self-pay | Admitting: Family Medicine

## 2020-01-07 ENCOUNTER — Other Ambulatory Visit: Payer: Self-pay

## 2020-01-07 ENCOUNTER — Ambulatory Visit: Payer: BC Managed Care – PPO | Admitting: Family Medicine

## 2020-01-07 VITALS — BP 120/80 | HR 77 | Temp 98.7°F | Ht 72.0 in | Wt 228.0 lb

## 2020-01-07 DIAGNOSIS — E1159 Type 2 diabetes mellitus with other circulatory complications: Secondary | ICD-10-CM

## 2020-01-07 DIAGNOSIS — E1165 Type 2 diabetes mellitus with hyperglycemia: Secondary | ICD-10-CM | POA: Diagnosis not present

## 2020-01-07 DIAGNOSIS — IMO0002 Reserved for concepts with insufficient information to code with codable children: Secondary | ICD-10-CM

## 2020-01-07 NOTE — Progress Notes (Signed)
Established Patient Office Visit  Subjective:  [name], male    DOB: 22-Apr-1976  Age: 44 y.o. MRN: [id number]  CC:  Chief Complaint  Patient presents with  . Letter for School/Work    restroom excuse note    HPI EARLEY GROBE presents basically requesting a work note allowing him to go to the restroom as needed at his work.  They recently instituted policy where they are not allowed to go except at certain times of day.  Nirvaan has history of CAD, hypertension, type 2 diabetes, hyperlipidemia.  His blood sugars have been suboptimally controlled and he is currently on Farxiga and Metformin.  It is expected that he may have some increased urine frequency because of the [place] and also as long as his diabetes is not optimally controlled  Past Medical History:  Diagnosis Date  . Median arcuate ligament syndrome [facility])     Past Surgical History:  Procedure Laterality Date  . CHOLECYSTECTOMY    . LEFT HEART CATH AND CORONARY ANGIOGRAPHY N/A 09/15/2019   Procedure: LEFT HEART CATH AND CORONARY ANGIOGRAPHY;  Surgeon: Lyn Records, MD;  Location: [facility];  Service: Cardiovascular;  Laterality: N/A;    Family History  Problem Relation Age of Onset  . Stroke Mother   . Diabetes Father   . Brain cancer Maternal Grandmother     Social History   Socioeconomic History  . Marital status: Married    Spouse name: Not on file  . Number of children: Not on file  . Years of education: Not on file  . Highest education level: Not on file  Occupational History    Employer: [employer]  Tobacco Use  . Smoking status: Former Smoker    Packs/day: 0.50    Types: Cigarettes    Quit date: 09/16/2019    Years since quitting: 0.3  . Smokeless tobacco: Never Used  Vaping Use  . Vaping Use: Never used  Substance and Sexual Activity  . Alcohol use: No  . Drug use: No  . Sexual activity: Yes  Other Topics Concern  . Not on file  Social History Narrative  .  Not on file   Social Determinants of Health   Financial Resource Strain:   . Difficulty of Paying Living Expenses:   Food Insecurity:   . Worried About Programme researcher, broadcasting/film/video in the Last Year:   . Barista in the Last Year:   Transportation Needs:   . Freight forwarder (Medical):   Marland Kitchen Lack of Transportation (Non-Medical):   Physical Activity:   . Days of Exercise per Week:   . Minutes of Exercise per Session:   Stress:   . Feeling of Stress :   Social Connections:   . Frequency of Communication with Friends and Family:   . Frequency of Social Gatherings with Friends and Family:   . Attends Religious Services:   . Active Member of Clubs or Organizations:   . Attends Banker Meetings:   Marland Kitchen Marital Status:   Intimate Partner Violence:   . Fear of Current or Ex-Partner:   . Emotionally Abused:   Marland Kitchen Physically Abused:   . Sexually Abused:     Outpatient Medications Prior to Visit  Medication Sig Dispense Refill  . aspirin 81 MG chewable tablet Chew 1 tablet (81 mg total) by mouth daily.    Marland Kitchen atorvastatin (LIPITOR) 80 MG tablet TAKE 1 TABLET (80 MG TOTAL) BY  MOUTH DAILY AT 6 PM. 90 tablet 3  . carvedilol (COREG) 25 MG tablet TAKE 1 TABLET (25 MG TOTAL) BY MOUTH 2 (TWO) TIMES DAILY WITH A MEAL. 180 tablet 3  . dapagliflozin propanediol (FARXIGA) 10 MG TABS tablet Take 10 mg by mouth daily before breakfast. 90 tablet 3  . losartan (COZAAR) 50 MG tablet Take 1 tablet (50 mg total) by mouth daily. 90 tablet 3  . metFORMIN (GLUCOPHAGE) 500 MG tablet Take two tablets by mouth twice daily 360 tablet 3  . nitroGLYCERIN (NITROSTAT) 0.4 MG SL tablet Place 1 tablet (0.4 mg total) under the tongue every 5 (five) minutes as needed for chest pain. 25 tablet 1  . pantoprazole (PROTONIX) 40 MG tablet Take 1 tablet (40 mg total) by mouth daily. 90 tablet 3  . ticagrelor (BRILINTA) 90 MG TABS tablet Take 1 tablet (90 mg total) by mouth 2 (two) times daily. 90 tablet 7   No  facility-administered medications prior to visit.    Allergies  Allergen Reactions  . Bee Venom   . Cat Hair Extract Anxiety, Itching and Other (See Comments)    Itching , watering eyes  Itching , watering eyes  Itching , watering eyes  Itching , watering eyes    . Other Itching    ROS Review of Systems  Constitutional: Negative for chills and fever.  Respiratory: Negative for shortness of breath.   Cardiovascular: Negative for chest pain.      Objective:    Physical Exam Vitals reviewed.  Constitutional:      Appearance: Normal appearance.  Cardiovascular:     Rate and Rhythm: Normal rate and regular rhythm.  Pulmonary:     Effort: Pulmonary effort is normal.     Breath sounds: Normal breath sounds.  Neurological:     General: No focal deficit present.     Mental Status: He is alert.     BP 120/80   Pulse 77   Temp 98.7 F (37.1 C) (Oral)   Ht 6' (1.829 m)   Wt 228 lb (103.4 kg)   SpO2 98%   BMI 30.92 kg/m  Wt Readings from Last 3 Encounters:  01/07/20 228 lb (103.4 kg)  12/24/19 (!) 227 lb 8 oz (103.2 kg)  12/19/19 (!) 225 lb 2 oz (102.1 kg)     Health Maintenance Due  Topic Date Due  . Hepatitis C Screening  Never done  . FOOT EXAM  Never done  . OPHTHALMOLOGY EXAM  Never done  . INFLUENZA VACCINE  12/28/2019    There are no preventive care reminders to display for this patient.  Lab Results  Component Value Date   TSH 0.847 09/13/2019   Lab Results  Component Value Date   WBC 9.8 09/16/2019   HGB 14.6 09/16/2019   HCT 41.3 09/16/2019   MCV 91.6 09/16/2019   PLT 262 09/16/2019   Lab Results  Component Value Date   NA 133 (L) 09/26/2019   K 5.1 09/26/2019   CO2 20 09/26/2019   GLUCOSE 304 (H) 09/26/2019   BUN 13 09/26/2019   CREATININE 1.22 09/26/2019   BILITOT 0.4 12/09/2019   ALKPHOS 85 12/09/2019   AST 22 12/09/2019   ALT 29 12/09/2019   PROT 7.7 12/09/2019   ALBUMIN 4.7 12/09/2019   CALCIUM 10.0 09/26/2019   ANIONGAP  12 09/16/2019   Lab Results  Component Value Date   CHOL 84 (L) 12/09/2019   Lab Results  Component Value Date   HDL  29 (L) 12/09/2019   Lab Results  Component Value Date   LDLCALC 30 12/09/2019   Lab Results  Component Value Date   TRIG 144 12/09/2019   Lab Results  Component Value Date   CHOLHDL 2.9 12/09/2019   Lab Results  Component Value Date   HGBA1C 10.3 (A) 11/21/2019      Assessment & Plan:   Type 2 diabetes suboptimally controlled with most recent A1c 10.3%.  Recent initiation of medications for diabetes.  -Needs follow-up A1c.  Future orders already been placed for PSA, hepatitis C antibody, and A1c and he is recommended to get that in the next month or so.  Office follow-up in 3 months  -We will produce letter for him to be able to go as needed to restroom for his work.  No orders of the defined types were placed in this encounter.   Follow-up: Return in about 3 months (around 04/08/2020).    Evelena Peat, MD

## 2020-01-09 ENCOUNTER — Telehealth: Payer: Self-pay

## 2020-01-09 NOTE — Telephone Encounter (Signed)
Pt notified letter is upfront to pick up

## 2020-02-17 ENCOUNTER — Ambulatory Visit: Payer: BC Managed Care – PPO | Admitting: Gastroenterology

## 2020-02-17 NOTE — Progress Notes (Deleted)
HPI :  He had NSTEMI on 09/13/2019 treated with drug-eluting stent to the ramus branch.  He had out-of-control diabetes and was started on a couple medications.  No recent chest pains.  He is here today to discuss chronic epigastric pain.  He states he has daily burning reflux type symptoms independent of what he eats.  He has noted specific foods like tomatoes, citric acid foods, and fatty foods seem to be worse.  We tried Protonix last visit and he is not sure he is taking this.  He states he has tried multiple PPIs in the past and none of them have worked.  He states the only thing that seem to work very well with Zantac.  He tried Pepcid and Tagamet without relief.  He relates has been to multiple centers including [facility]out, [facility], [place], and somewhere in [place] to various GI clinics.  He has had at least 2 and possibly 3 EGDs in the past he thinks most recently 2015.  He denies any dysphagia.  No hematemesis.  No melena.  No appetite or weight changes.    CT angio - 09/13/19 -  IMPRESSION: 1. No aortic dissection or aneurysm. 2. Minimal calcified aortic atherosclerosis. 3. No pulmonary emboli. 4. No acute abnormality in the chest, abdomen or pelvis.  Aortic Atherosclerosis (ICD10-I70.0).   Seem by [facility] 2019 -  Pt denies any association with eating food and reports approximately 30 lbs weight loss over the past year. He has been evaluated by [facility] in the past and per [facility] he has had 3 EGDs (no records available), nuclear emptying study (no record), and multiple cross sectional images. He was subsequently referred to a pain clinic. He denies taking any medications at this time but previously was prescribed morphine and percocet that he was unable to tolerate due to nausea. He denies any benefit previously on Gabapentin, Elavil, or Trazodone although he is unsure how long he was on these medications for.    Vas [place] Mesenteric Duplex 06/29/17: Variation of peak systolic  velocities of the celiac artery and superior mesenteric artery during inspiration and expiration suggestive of possible medial arcuate ligament syndrome.  44 y.o. male evaluated at [facility] for chronic abdominal pain with an extensive workup including EGDx3 (records unavailable), nuclear emptying study, and cross sectional imaging. He was specifically referred to [facility] by his local vascular surgeon for concern for medial arcuate ligament syndrome. His clinical picture does not seem consistent with this diagnosis and we discussed this extensively with the patient and his fiance. We explained that this was most likely a regional pain syndrome with a largely neuropathic component and we would not recommend surgical intervention. Despite this conversation the pt requested referral to vascular surgery and turned down any medication options including TCA, Cymbalta, or Trazodone that were offered to him at this visit. We will place the vascular surgery referral per patient request.     CTA abdomen / pelvis - 11/09/17 - no significant celiac artery compression, no other acute findings  GES 07/09/14 - "emptying at slower limits of normal"  HIDA 05/20/14 - negative for SOD  EGD 02/24/14 - [facility] -   [place] 01/03/13 - negative for gallstones  CT abdomen pelvis 10/28/15 - normal   11 CT scans abdomen / pelvis since 2014  Past Medical History:  Diagnosis Date  . Median arcuate ligament syndrome [facility])      Past Surgical History:  Procedure Laterality Date  . CHOLECYSTECTOMY    .  LEFT HEART CATH AND CORONARY ANGIOGRAPHY N/A 09/15/2019   Procedure: LEFT HEART CATH AND CORONARY ANGIOGRAPHY;  Surgeon: Lyn Records, MD;  Location: [facility];  Service: Cardiovascular;  Laterality: N/A;   Family History  Problem Relation Age of Onset  . Stroke Mother   . Diabetes Father   . Brain cancer Maternal Grandmother    Social History   Tobacco Use  . Smoking status: Former Smoker    Packs/day: 0.50    Types:  Cigarettes    Quit date: 09/16/2019    Years since quitting: 0.4  . Smokeless tobacco: Never Used  Vaping Use  . Vaping Use: Never used  Substance Use Topics  . Alcohol use: No  . Drug use: No   Current Outpatient Medications  Medication Sig Dispense Refill  . aspirin 81 MG chewable tablet Chew 1 tablet (81 mg total) by mouth daily.    Marland Kitchen atorvastatin (LIPITOR) 80 MG tablet TAKE 1 TABLET (80 MG TOTAL) BY MOUTH DAILY AT 6 PM. 90 tablet 3  . carvedilol (COREG) 25 MG tablet TAKE 1 TABLET (25 MG TOTAL) BY MOUTH 2 (TWO) TIMES DAILY WITH A MEAL. 180 tablet 3  . dapagliflozin propanediol (FARXIGA) 10 MG TABS tablet Take 10 mg by mouth daily before breakfast. 90 tablet 3  . losartan (COZAAR) 50 MG tablet Take 1 tablet (50 mg total) by mouth daily. 90 tablet 3  . metFORMIN (GLUCOPHAGE) 500 MG tablet Take two tablets by mouth twice daily 360 tablet 3  . nitroGLYCERIN (NITROSTAT) 0.4 MG SL tablet Place 1 tablet (0.4 mg total) under the tongue every 5 (five) minutes as needed for chest pain. 25 tablet 1  . pantoprazole (PROTONIX) 40 MG tablet Take 1 tablet (40 mg total) by mouth daily. 90 tablet 3  . ticagrelor (BRILINTA) 90 MG TABS tablet Take 1 tablet (90 mg total) by mouth 2 (two) times daily. 90 tablet 7   No current facility-administered medications for this visit.   Allergies  Allergen Reactions  . Bee Venom   . Cat Hair Extract Anxiety, Itching and Other (See Comments)    Itching , watering eyes  Itching , watering eyes  Itching , watering eyes  Itching , watering eyes    . Other Itching     Review of Systems: All systems reviewed and negative except where noted in HPI.    No results found.  Physical Exam: There were no vitals taken for this visit. Constitutional: Pleasant,well-developed, ***male in no acute distress. HEENT: Normocephalic and atraumatic. Conjunctivae are normal. No scleral icterus. Neck supple.  Cardiovascular: Normal rate, regular rhythm.    Pulmonary/chest: Effort normal and breath sounds normal. No wheezing, rales or rhonchi. Abdominal: Soft, nondistended, nontender. Bowel sounds active throughout. There are no masses palpable. No hepatomegaly. Extremities: no edema Lymphadenopathy: No cervical adenopathy noted. Neurological: Alert and oriented to person place and time. Skin: Skin is warm and dry. No rashes noted. Psychiatric: Normal mood and affect. Behavior is normal.   ASSESSMENT AND PLAN:  Kristian Covey, MD

## 2020-02-24 ENCOUNTER — Other Ambulatory Visit: Payer: Self-pay

## 2020-02-24 ENCOUNTER — Ambulatory Visit
Admission: EM | Admit: 2020-02-24 | Discharge: 2020-02-24 | Disposition: A | Discharge status: Home / Self Care | Payer: BC Managed Care – PPO | Attending: Family Medicine | Admitting: Family Medicine

## 2020-02-24 DIAGNOSIS — M79645 Pain in left finger(s): Secondary | ICD-10-CM | POA: Diagnosis not present

## 2020-02-24 DIAGNOSIS — L03012 Cellulitis of left finger: Secondary | ICD-10-CM

## 2020-02-24 MED ORDER — CEPHALEXIN 500 MG PO CAPS: 500.0000 mg | ORAL_CAPSULE | Freq: Two times a day (BID) | ORAL | 0 refills | Status: AC

## 2020-02-24 NOTE — ED Triage Notes (Signed)
Pt had hangnail on left third digit.  Was mildly swollen for a few days then swelling increased yesterday.  Warm to touch.  Tried soaking it in vinegar.

## 2020-02-24 NOTE — ED Provider Notes (Signed)
[facility]   [id number] 02/24/20 Arrival Time: 1417  CC: RASH  SUBJECTIVE:  Kirk Barker is a 44 y.o. male who presents with a skin complaint that began about 3 days ago.  Reports that he had a hangnail to the left middle finger that was swollen and a little sore.  Reports that the area has become very tight feeling with a whitish color around the nail over the last 2 days.  Has soaked in vinegar with no relief. There are no aggravating or alleviating factors. Denies fever, chills, nausea, vomiting, discharge, oral lesions, SOB, chest pain, abdominal pain, changes in bowel or bladder function.    ROS: As per HPI.  All other pertinent ROS negative.     Past Medical History:  Diagnosis Date  . Median arcuate ligament syndrome [facility])    Past Surgical History:  Procedure Laterality Date  . CHOLECYSTECTOMY    . LEFT HEART CATH AND CORONARY ANGIOGRAPHY N/A 09/15/2019   Procedure: LEFT HEART CATH AND CORONARY ANGIOGRAPHY;  Surgeon: Lyn Records, MD;  Location: [facility];  Service: Cardiovascular;  Laterality: N/A;   Allergies  Allergen Reactions  . Bee Venom   . Cat Hair Extract Anxiety, Itching and Other (See Comments)    Itching , watering eyes  Itching , watering eyes  Itching , watering eyes  Itching , watering eyes    . Other Itching   No current facility-administered medications on file prior to encounter.   Current Outpatient Medications on File Prior to Encounter  Medication Sig Dispense Refill  . aspirin 81 MG chewable tablet Chew 1 tablet (81 mg total) by mouth daily.    Marland Kitchen atorvastatin (LIPITOR) 80 MG tablet TAKE 1 TABLET (80 MG TOTAL) BY MOUTH DAILY AT 6 PM. 90 tablet 3  . carvedilol (COREG) 25 MG tablet TAKE 1 TABLET (25 MG TOTAL) BY MOUTH 2 (TWO) TIMES DAILY WITH A MEAL. 180 tablet 3  . dapagliflozin propanediol (FARXIGA) 10 MG TABS tablet Take 10 mg by mouth daily before breakfast. 90 tablet 3  . losartan (COZAAR) 50 MG tablet Take 1 tablet (50  mg total) by mouth daily. 90 tablet 3  . metFORMIN (GLUCOPHAGE) 500 MG tablet Take two tablets by mouth twice daily 360 tablet 3  . nitroGLYCERIN (NITROSTAT) 0.4 MG SL tablet Place 1 tablet (0.4 mg total) under the tongue every 5 (five) minutes as needed for chest pain. 25 tablet 1  . pantoprazole (PROTONIX) 40 MG tablet Take 1 tablet (40 mg total) by mouth daily. 90 tablet 3  . ticagrelor (BRILINTA) 90 MG TABS tablet Take 1 tablet (90 mg total) by mouth 2 (two) times daily. 90 tablet 7   Social History   Socioeconomic History  . Marital status: Married    Spouse name: Not on file  . Number of children: Not on file  . Years of education: Not on file  . Highest education level: Not on file  Occupational History    Employer: [employer]  Tobacco Use  . Smoking status: Former Smoker    Packs/day: 0.50    Types: Cigarettes    Quit date: 09/16/2019    Years since quitting: 0.4  . Smokeless tobacco: Never Used  Vaping Use  . Vaping Use: Never used  Substance and Sexual Activity  . Alcohol use: Yes    Comment: social   . Drug use: No  . Sexual activity: Yes  Other Topics Concern  . Not on file  Social  History Narrative  . Not on file   Social Determinants of Health   Financial Resource Strain:   . Difficulty of Paying Living Expenses: Not on file  Food Insecurity:   . Worried About Programme researcher, broadcasting/film/video in the Last Year: Not on file  . Ran Out of Food in the Last Year: Not on file  Transportation Needs:   . Lack of Transportation (Medical): Not on file  . Lack of Transportation (Non-Medical): Not on file  Physical Activity:   . Days of Exercise per Week: Not on file  . Minutes of Exercise per Session: Not on file  Stress:   . Feeling of Stress : Not on file  Social Connections:   . Frequency of Communication with Friends and Family: Not on file  . Frequency of Social Gatherings with Friends and Family: Not on file  . Attends Religious Services: Not on file  . Active Member  of Clubs or Organizations: Not on file  . Attends Banker Meetings: Not on file  . Marital Status: Not on file  Intimate Partner Violence:   . Fear of Current or Ex-Partner: Not on file  . Emotionally Abused: Not on file  . Physically Abused: Not on file  . Sexually Abused: Not on file   Family History  Problem Relation Age of Onset  . Stroke Mother   . Diabetes Father   . Brain cancer Maternal Grandmother     OBJECTIVE: Vitals:   02/24/20 1424  BP: (!) 142/92  Pulse: 86  Resp: 18  Temp: 99.1 F (37.3 C)  TempSrc: Oral  SpO2: 96%    General appearance: alert; no distress Head: NCAT Lungs: clear to auscultation bilaterally Heart: regular rate and rhythm.  Radial pulse 2+ bilaterally Extremities: no edema Skin: warm and dry; erythema and swelling to lateral aspect of the left middle finger nail Psychological: alert and cooperative; normal mood and affect  ASSESSMENT & PLAN:  1. Paronychia of left middle finger   2. Pain of finger of left hand     Meds ordered this encounter  Medications  . cephALEXin (KEFLEX) 500 MG capsule    Sig: Take 1 capsule (500 mg total) by mouth 2 (two) times daily for 7 days.    Dispense:  14 capsule    Refill:  0    Order Specific Question:   Supervising Provider    Answer:   Merrilee Jansky [[id number]]    Paronychia I&D in office Tolerated well Prescribed Keflex BID x 7 days Take as prescribed and to completion Avoid hot showers/ baths Moisturize skin daily  Follow up with PCP if symptoms persists Return or go to the ER if you have any new or worsening symptoms such as fever, chills, nausea, vomiting, redness, swelling, discharge, if symptoms do not improve with medications  Reviewed expectations re: course of current medical issues. Questions answered. Outlined signs and symptoms indicating need for more acute intervention. Patient verbalized understanding. After Visit Summary given.   Moshe Cipro,  NP 02/24/20 1512

## 2020-02-24 NOTE — Discharge Instructions (Signed)
I have sent in Keflex for you to take twice a day for 7 days  Follow up with this office or with primary care as needed  

## 2020-04-12 ENCOUNTER — Ambulatory Visit (INDEPENDENT_AMBULATORY_CARE_PROVIDER_SITE_OTHER): Payer: BC Managed Care – PPO | Admitting: Cardiology

## 2020-04-12 ENCOUNTER — Other Ambulatory Visit: Payer: Self-pay

## 2020-04-12 ENCOUNTER — Encounter: Payer: Self-pay | Admitting: Cardiology

## 2020-04-12 VITALS — BP 124/86 | HR 85 | Ht 72.0 in | Wt 238.0 lb

## 2020-04-12 DIAGNOSIS — Z7189 Other specified counseling: Secondary | ICD-10-CM

## 2020-04-12 DIAGNOSIS — I10 Essential (primary) hypertension: Secondary | ICD-10-CM | POA: Diagnosis not present

## 2020-04-12 DIAGNOSIS — I255 Ischemic cardiomyopathy: Secondary | ICD-10-CM

## 2020-04-12 DIAGNOSIS — I2510 Atherosclerotic heart disease of native coronary artery without angina pectoris: Secondary | ICD-10-CM

## 2020-04-12 DIAGNOSIS — E118 Type 2 diabetes mellitus with unspecified complications: Secondary | ICD-10-CM

## 2020-04-12 DIAGNOSIS — I252 Old myocardial infarction: Secondary | ICD-10-CM

## 2020-04-12 DIAGNOSIS — E7800 Pure hypercholesterolemia, unspecified: Secondary | ICD-10-CM

## 2020-04-12 NOTE — Progress Notes (Signed)
Cardiology Office Note:    Date:  04/12/2020   ID:  Kirk Barker, DOB 06/04/1975, MRN [id number]  PCP:  Kristian CoveyBurchette, Bruce W, MD  Cardiologist:  Jodelle RedBridgette Genesia Caslin, MD  Electrophysiologist:  None   Referring MD: Kristian CoveyBurchette, Bruce W, MD   Chief Complaint: follow up  History of Present Illness:    Kirk Barker is a 44 y.o. male with a history of CAD s/p recent NSTEMI on 09/13/2019 treated with DES to ramus branch, hypertension, hyperlipidemia, type 2 diabetes mellitus, chronic epigastric pain, pancreatitis, GERD, tobacco use, and median arcuate ligament syndrome seen for follow up today.  Cardiac history: 08/2019 admission for NSTEMI. Cath showed high-grade thrombotic obstruction in the equivalent of the ramus intermedius prior to bifurcation into 2 large branches. Successfully treated with PCI/DES to perpendicular branch reducing the 99% stenosis to 0% with TIMI grade III flow. Otherwise, non-obstructive CAD. Patient started on dual antiplatelet therapy, beta blocker, and high-intensity statin. Of note, patient markedly hypertensive on presentation with BP as high as the 200's/110's. LDL 126 and Hemoglobin A1c 11.6.   Today: Doing well overall. Feels rare occasional skipped beats. Has fleeting sensation of tight breathing occasionally in bed in the morning. Not exertional. Still walking his building routinely. Has had to climb 4 flights of stairs, and that tired him out. Remains tobacco free. No issues with medications.  Working on diabetes with Dr. Caryl NeverBurchette. Reviewed lipids from 11/2019, LDL 30.   Denies chest pain, shortness of breath with normal exertion. No PND, orthopnea, LE edema or unexpected weight gain. No syncope, rare brief palpitations.  Past Medical History:  Diagnosis Date  . Median arcuate ligament syndrome [facility](HCC)     Past Surgical History:  Procedure Laterality Date  . CHOLECYSTECTOMY    . LEFT HEART CATH AND CORONARY ANGIOGRAPHY N/A 09/15/2019   Procedure: LEFT  HEART CATH AND CORONARY ANGIOGRAPHY;  Surgeon: Lyn RecordsSmith, Henry W, MD;  Location: [facility] INVASIVE CV LAB;  Service: Cardiovascular;  Laterality: N/A;    Current Medications: Current Meds  Medication Sig  . aspirin 81 MG chewable tablet Chew 1 tablet (81 mg total) by mouth daily.  Marland Kitchen. atorvastatin (LIPITOR) 80 MG tablet TAKE 1 TABLET (80 MG TOTAL) BY MOUTH DAILY AT 6 PM.  . carvedilol (COREG) 25 MG tablet TAKE 1 TABLET (25 MG TOTAL) BY MOUTH 2 (TWO) TIMES DAILY WITH A MEAL.  . dapagliflozin propanediol (FARXIGA) 10 MG TABS tablet Take 10 mg by mouth daily before breakfast.  . losartan (COZAAR) 50 MG tablet Take 1 tablet (50 mg total) by mouth daily.  . metFORMIN (GLUCOPHAGE) 500 MG tablet Take two tablets by mouth twice daily  . nitroGLYCERIN (NITROSTAT) 0.4 MG SL tablet Place 1 tablet (0.4 mg total) under the tongue every 5 (five) minutes as needed for chest pain.  . pantoprazole (PROTONIX) 40 MG tablet Take 1 tablet (40 mg total) by mouth daily.  . ticagrelor (BRILINTA) 90 MG TABS tablet Take 1 tablet (90 mg total) by mouth 2 (two) times daily.     Allergies:   Bee venom, Cat hair extract, and Other   Social History   Tobacco Use  . Smoking status: Former Smoker    Packs/day: 0.50    Types: Cigarettes    Quit date: 09/16/2019    Years since quitting: 0.5  . Smokeless tobacco: Never Used  Vaping Use  . Vaping Use: Never used  Substance Use Topics  . Alcohol use: Yes    Comment: social   . Drug  use: No    Family History: The patient's family history includes Brain cancer in his maternal grandmother; Diabetes in his father; Stroke in his mother.  ROS:   Please see the history of present illness.  Remainder of review of systems otherwise unremarkable.  EKGs/Labs/Other Studies Reviewed:    The following studies were reviewed today:  Limited Echo 09/13/2019: Impressions: 1. Left ventricular ejection fraction, by estimation, is 60 to 65%. The  left ventricle has normal function. The  left ventricle demonstrates  regional wall motion abnormalities (see scoring diagram/findings for  description). There is mild concentric left  ventricular hypertrophy. Left ventricular diastolic function could not be  evaluated. There is hypokinesis of the left ventricular, entire  inferolateral wall and inferior wall.  2. Right ventricular systolic function was not well visualized. The right  ventricular size is normal.  3. The mitral valve is normal in structure. Trivial mitral valve  regurgitation. No evidence of mitral stenosis.  4. The aortic valve was not assessed.  _______________  Complete Echo 09/14/2019: Impressions: 1. Left ventricular ejection fraction, by estimation, is 40 to 45%. The  left ventricle has mildly decreased function. The left ventricle  demonstrates regional wall motion abnormalities. Global hypokinesis,  appears worse in lateral wall. There is  moderate left ventricular hypertrophy. Left ventricular diastolic  parameters are consistent with Grade I diastolic dysfunction (impaired  relaxation).  2. Right ventricular systolic function is normal. The right ventricular  size is normal. Tricuspid regurgitation signal is inadequate for assessing  PA pressure.  3. The mitral valve is normal in structure. No evidence of mitral valve  regurgitation.  4. The aortic valve is tricuspid. Aortic valve regurgitation is not  visualized. No aortic stenosis is present.  5. The inferior vena cava is normal in size with greater than 50%  respiratory variability, suggesting right atrial pressure of 3 mmHg.  6. Compared to prior study on 09/13/19, no significant change  _______________  Left Heart Catheterization 09/15/2019:  Non-ST elevation myocardial infarction without EKG changes but abnormal high-sensitivity troponin I ischemic biomarkers.  High-grade, thrombotic obstruction in the equivalent of the ramus intermedius prior to a bifurcation into 2 large  branches.  The more perpendicular branch contained proximal 40% narrowing with widely patent ostium.  Successful PCI and stent implantation across the perpendicular branch reducing the 99% stenosis to 0% with TIMI grade III flow.  The device was a 2.5 x 18 Onyx postdilated to 2.75 mm in the proximal two thirds of the stent.  Widely patent left main  30% proximal LAD with mild to moderate diffuse mid and distal disease.  Widely patent circumflex with minimal luminal irregularities.  Codominant/dominant right coronary with 75% stenosis in the distal one third of the first left ventricular branch.  Mildly reduced to low normal LV systolic function with EF 45 to 50%.  No obvious regional wall motion abnormality noted.  Hypertension treated with intracoronary nitroglycerin and 2 doses of IV labetalol totaling 20 mg.  Recommendations:  Preventive therapy: Aggressive lipid-lowering to LDL less than 70.  Glycemic control to hemoglobin A1c less than 7.  Blood pressure control to 130/80 mmHg or less.  Smoking cessation.  I increased losartan to 50 mg/day.  May need to increase carvedilol to 25 mg twice daily.  Aspirin and Brilinta x12 months.  Discharge in a.m.  EKG:  EKG personally reviewed today. EKG ordered 09/26/19 demonstrates normal sinus rhythm, rate 78 bpm, with no acute ST/T changes. Normal axis. Normal PR and QRS intervals.  QTc 392 ms.  Recent Labs: 09/13/2019: Magnesium 2.2; TSH 0.847 09/16/2019: Hemoglobin 14.6; Platelets 262 09/26/2019: BUN 13; Creatinine, Ser 1.22; Potassium 5.1; Sodium 133 12/09/2019: ALT 29  Recent Lipid Panel    Component Value Date/Time   CHOL 84 (L) 12/09/2019 1015   TRIG 144 12/09/2019 1015   HDL 29 (L) 12/09/2019 1015   CHOLHDL 2.9 12/09/2019 1015   CHOLHDL 4.7 09/14/2019 0332   VLDL 12 09/14/2019 0332   LDLCALC 30 12/09/2019 1015    Physical Exam:    Vital Signs: BP 124/86 (BP Location: Right Arm, Patient Position: Sitting)   Pulse 85   Ht 6'  (1.829 m)   Wt 238 lb (108 kg)   SpO2 98%   BMI 32.28 kg/m     Wt Readings from Last 3 Encounters:  04/12/20 238 lb (108 kg)  01/07/20 228 lb (103.4 kg)  12/24/19 (!) 227 lb 8 oz (103.2 kg)    GEN: Well nourished, well developed in no acute distress HEENT: Normal, moist mucous membranes NECK: No JVD CARDIAC: regular rhythm, normal S1 and S2, no rubs or gallops. No murmur. VASCULAR: Radial and DP pulses 2+ bilaterally. No carotid bruits RESPIRATORY:  Clear to auscultation without rales, wheezing or rhonchi  ABDOMEN: Soft, non-tender, non-distended MUSCULOSKELETAL:  Ambulates independently SKIN: Warm and dry, no edema NEUROLOGIC:  Alert and oriented x 3. No focal neuro deficits noted. PSYCHIATRIC:  Normal affect   Assessment:    1. Coronary artery disease involving native coronary artery of native heart without angina pectoris   2. History of non-ST elevation myocardial infarction (NSTEMI)   3. Ischemic cardiomyopathy   4. Essential hypertension   5. Type 2 diabetes mellitus with complication, without long-term current use of insulin (HCC)   6. Pure hypercholesterolemia   7. Cardiac risk counseling     Plan:    CAD with prior NSTEMI 08/2019 -s/p PCI/DES to ramus branch. Otherwise, non-obstructive CAD. -continue aspirin 81 mg daily, ticagrelor 90 mg BID (DAPT until 08/2020) -continue carvedilol, atorvastatin -continue dapagliflozin -remains tobacco free  Ischemic Cardiomyopathy -Echo showed LVEF of 40-45% with global hypokinesis, worse in the lateral wall, and grade 1 diastolic dysfunction.  -euvolemic on exam, NYHA class I -continue carvedilol, losartan, dapagliflozin  Hypertension -near goal of <130/80 -continue carvedilol, losartan as above  Hypercholesterolemia -LDL goal <70 -last LDL 11/2019 30, at goal -continue atorvastatin 80 mg daily  Type 2 Diabetes Mellitus -Hemoglobin A1c 11.6% on 09/13/2019. Recheck 11/21/19 10.3 -continue dapagliflozin given CAD and  reduced EF -on metformin as well -working with Dr. Caryl Never on management  CV risk counseling and secondary prevention -recommend heart healthy/Mediterranean diet, with whole grains, fruits, vegetable, fish, lean meats, nuts, and olive oil. Limit salt. -recommend moderate walking, 3-5 times/week for 30-50 minutes each session. Aim for at least 150 minutes.week. Goal should be pace of 3 miles/hours, or walking 1.5 miles in 30 minutes -recommend avoidance of tobacco products. Avoid excess alcohol. He has continued to remain tobacco free since discharge from hospital  Follow up: 6 mos  Medication Adjustments/Labs and Tests Ordered: Current medicines are reviewed at length with the patient today.  Concerns regarding medicines are outlined above.  No orders of the defined types were placed in this encounter.  No orders of the defined types were placed in this encounter.   Patient Instructions  Medication Instructions:  Your Physician recommend you continue on your current medication as directed.    *If you need a refill on your cardiac medications before  your next appointment, please call your pharmacy*   Lab Work: None   Testing/Procedures: None   Follow-Up: At [facility], you and your health needs are our priority.  As part of our continuing mission to provide you with exceptional heart care, we have created designated Provider Care Teams.  These Care Teams include your primary Cardiologist (physician) and Advanced Practice Providers (APPs -  Physician Assistants and Nurse Practitioners) who all work together to provide you with the care you need, when you need it.  We recommend signing up for the patient portal called "MyChart".  Sign up information is provided on this After Visit Summary.  MyChart is used to connect with patients for Virtual Visits (Telemedicine).  Patients are able to view lab/test results, encounter notes, upcoming appointments, etc.  Non-urgent messages can  be sent to your provider as well.   To learn more about what you can do with MyChart, go to [email or web address].    Your next appointment:   6 month(s)  The format for your next appointment:   In Person  Provider:   Jodelle Red, MD       Signed, Jodelle Red, MD  04/12/2020   [facility]

## 2020-04-12 NOTE — Patient Instructions (Signed)

## 2020-04-27 ENCOUNTER — Emergency Department
Admission: EM | Admit: 2020-04-27 | Discharge: 2020-04-27 | Disposition: A | Discharge status: Home / Self Care | Payer: BC Managed Care – PPO | Attending: Emergency Medicine | Admitting: Emergency Medicine

## 2020-04-27 ENCOUNTER — Other Ambulatory Visit: Payer: Self-pay

## 2020-04-27 ENCOUNTER — Encounter: Payer: Self-pay | Admitting: *Deleted

## 2020-04-27 DIAGNOSIS — I10 Essential (primary) hypertension: Secondary | ICD-10-CM | POA: Diagnosis not present

## 2020-04-27 DIAGNOSIS — R519 Headache, unspecified: Secondary | ICD-10-CM | POA: Diagnosis not present

## 2020-04-27 DIAGNOSIS — Z5321 Procedure and treatment not carried out due to patient leaving prior to being seen by health care provider: Secondary | ICD-10-CM | POA: Diagnosis not present

## 2020-04-27 DIAGNOSIS — R040 Epistaxis: Secondary | ICD-10-CM | POA: Diagnosis not present

## 2020-04-27 LAB — BASIC METABOLIC PANEL
Anion gap: 11 (ref 5–15)
BUN: 13 mg/dL (ref 6–20)
CO2: 24 mmol/L (ref 22–32)
Calcium: 9.4 mg/dL (ref 8.9–10.3)
Chloride: 99 mmol/L (ref 98–111)
Creatinine, Ser: 1.04 mg/dL (ref 0.61–1.24)
GFR, Estimated: >60 mL/min (ref >60)
Glucose, Bld: 260 mg/dL — ABNORMAL HIGH (ref 70–99)
Potassium: 4.2 mmol/L (ref 3.5–5.1)
Sodium: 134 mmol/L — ABNORMAL LOW (ref 135–145)

## 2020-04-27 LAB — CBC
HCT: 40.7 % (ref 39.0–52.0)
Hemoglobin: 14.3 g/dL (ref 13.0–17.0)
MCH: 31.8 pg (ref 26.0–34.0)
MCHC: 35.1 g/dL (ref 30.0–36.0)
MCV: 90.4 fL (ref 80.0–100.0)
Platelets: 268 10*3/uL (ref 150–400)
RBC: 4.5 MIL/uL (ref 4.22–5.81)
RDW: 13 % (ref 11.5–15.5)
WBC: 7.1 10*3/uL (ref 4.0–10.5)
nRBC: 0 % (ref 0.0–0.2)

## 2020-04-27 MED ORDER — OXYMETAZOLINE HCL 0.05 % NA SOLN: 1.0000 | Freq: Once | NASAL | Status: DC

## 2020-04-27 NOTE — ED Notes (Addendum)
No answer when called several times from lobby pt called on cell phone # listed on chart & pt st already left

## 2020-04-27 NOTE — ED Triage Notes (Signed)
Pt ambulatory to triage. Pt has nose bleed from right nares.  Small amount of blood on tissue.  Bleeding since 1600 today.  Hx htn.  Headache for past 2 days.  No h/a today.  Pt alert  Speech clear.

## 2020-06-15 DIAGNOSIS — I252 Old myocardial infarction: Secondary | ICD-10-CM | POA: Insufficient documentation

## 2020-06-15 DIAGNOSIS — I255 Ischemic cardiomyopathy: Secondary | ICD-10-CM | POA: Insufficient documentation

## 2020-07-21 DIAGNOSIS — Z0000 Encounter for general adult medical examination without abnormal findings: Secondary | ICD-10-CM | POA: Diagnosis not present

## 2020-07-21 DIAGNOSIS — R799 Abnormal finding of blood chemistry, unspecified: Secondary | ICD-10-CM | POA: Diagnosis not present

## 2020-07-21 DIAGNOSIS — R112 Nausea with vomiting, unspecified: Secondary | ICD-10-CM | POA: Diagnosis not present

## 2020-07-21 DIAGNOSIS — R1010 Upper abdominal pain, unspecified: Secondary | ICD-10-CM | POA: Diagnosis not present

## 2020-10-22 ENCOUNTER — Other Ambulatory Visit: Payer: Self-pay | Admitting: Student

## 2020-12-21 ENCOUNTER — Other Ambulatory Visit: Payer: Self-pay | Admitting: Family Medicine

## 2020-12-21 ENCOUNTER — Other Ambulatory Visit: Payer: Self-pay | Admitting: Student

## 2020-12-24 ENCOUNTER — Encounter: Payer: Medicaid Other | Admitting: Family Medicine

## 2021-01-03 ENCOUNTER — Ambulatory Visit: Payer: Medicaid Other | Admitting: Podiatry

## 2021-06-09 ENCOUNTER — Other Ambulatory Visit: Payer: Self-pay

## 2021-06-09 ENCOUNTER — Inpatient Hospital Stay (HOSPITAL_COMMUNITY)
Admission: EM | Admit: 2021-06-09 | Discharge: 2021-06-12 | DRG: 637 | Disposition: A | Discharge status: Home / Self Care | Payer: BC Managed Care – PPO | Attending: Internal Medicine | Admitting: Internal Medicine

## 2021-06-09 ENCOUNTER — Encounter (HOSPITAL_COMMUNITY): Payer: Self-pay | Admitting: Emergency Medicine

## 2021-06-09 ENCOUNTER — Emergency Department (HOSPITAL_COMMUNITY): Payer: BC Managed Care – PPO

## 2021-06-09 DIAGNOSIS — Z823 Family history of stroke: Secondary | ICD-10-CM

## 2021-06-09 DIAGNOSIS — I774 Celiac artery compression syndrome: Secondary | ICD-10-CM | POA: Diagnosis not present

## 2021-06-09 DIAGNOSIS — Z7984 Long term (current) use of oral hypoglycemic drugs: Secondary | ICD-10-CM | POA: Diagnosis not present

## 2021-06-09 DIAGNOSIS — N179 Acute kidney failure, unspecified: Secondary | ICD-10-CM | POA: Diagnosis not present

## 2021-06-09 DIAGNOSIS — I10 Essential (primary) hypertension: Secondary | ICD-10-CM | POA: Diagnosis not present

## 2021-06-09 DIAGNOSIS — E860 Dehydration: Secondary | ICD-10-CM | POA: Diagnosis present

## 2021-06-09 DIAGNOSIS — I255 Ischemic cardiomyopathy: Secondary | ICD-10-CM | POA: Diagnosis present

## 2021-06-09 DIAGNOSIS — Z79899 Other long term (current) drug therapy: Secondary | ICD-10-CM

## 2021-06-09 DIAGNOSIS — Z9049 Acquired absence of other specified parts of digestive tract: Secondary | ICD-10-CM | POA: Diagnosis not present

## 2021-06-09 DIAGNOSIS — Z7902 Long term (current) use of antithrombotics/antiplatelets: Secondary | ICD-10-CM | POA: Diagnosis not present

## 2021-06-09 DIAGNOSIS — K219 Gastro-esophageal reflux disease without esophagitis: Secondary | ICD-10-CM | POA: Diagnosis present

## 2021-06-09 DIAGNOSIS — Z765 Malingerer [conscious simulation]: Secondary | ICD-10-CM | POA: Diagnosis not present

## 2021-06-09 DIAGNOSIS — R0902 Hypoxemia: Secondary | ICD-10-CM | POA: Diagnosis not present

## 2021-06-09 DIAGNOSIS — U071 COVID-19: Secondary | ICD-10-CM | POA: Diagnosis not present

## 2021-06-09 DIAGNOSIS — E1110 Type 2 diabetes mellitus with ketoacidosis without coma: Principal | ICD-10-CM | POA: Diagnosis present

## 2021-06-09 DIAGNOSIS — R112 Nausea with vomiting, unspecified: Secondary | ICD-10-CM | POA: Diagnosis present

## 2021-06-09 DIAGNOSIS — Z808 Family history of malignant neoplasm of other organs or systems: Secondary | ICD-10-CM | POA: Diagnosis not present

## 2021-06-09 DIAGNOSIS — Z7982 Long term (current) use of aspirin: Secondary | ICD-10-CM

## 2021-06-09 DIAGNOSIS — Z9114 Patient's other noncompliance with medication regimen: Secondary | ICD-10-CM | POA: Diagnosis not present

## 2021-06-09 DIAGNOSIS — I2510 Atherosclerotic heart disease of native coronary artery without angina pectoris: Secondary | ICD-10-CM | POA: Diagnosis not present

## 2021-06-09 DIAGNOSIS — Z87891 Personal history of nicotine dependence: Secondary | ICD-10-CM

## 2021-06-09 DIAGNOSIS — I252 Old myocardial infarction: Secondary | ICD-10-CM | POA: Diagnosis not present

## 2021-06-09 DIAGNOSIS — R739 Hyperglycemia, unspecified: Secondary | ICD-10-CM | POA: Diagnosis not present

## 2021-06-09 DIAGNOSIS — E871 Hypo-osmolality and hyponatremia: Secondary | ICD-10-CM | POA: Diagnosis present

## 2021-06-09 DIAGNOSIS — E861 Hypovolemia: Secondary | ICD-10-CM | POA: Diagnosis present

## 2021-06-09 DIAGNOSIS — R0689 Other abnormalities of breathing: Secondary | ICD-10-CM | POA: Diagnosis not present

## 2021-06-09 DIAGNOSIS — Z833 Family history of diabetes mellitus: Secondary | ICD-10-CM | POA: Diagnosis not present

## 2021-06-09 DIAGNOSIS — R1084 Generalized abdominal pain: Secondary | ICD-10-CM | POA: Diagnosis not present

## 2021-06-09 LAB — CBG MONITORING, ED: Glucose-Capillary: 517 mg/dL (ref 70–99)

## 2021-06-09 MED ORDER — FENTANYL CITRATE PF 50 MCG/ML IJ SOSY
50.0000 ug | PREFILLED_SYRINGE | Freq: Once | INTRAMUSCULAR | Status: AC
  Administered 2021-06-10: 50 ug via INTRAVENOUS
  Filled 2021-06-09: qty 1

## 2021-06-09 NOTE — ED Triage Notes (Addendum)
Patient tested positive for covid yesterday, patient is tachy, hyperglycemic, short of breath.  Patient is unmedicated for his DM.  Patient not compliant with all his medications.  Patient denies any fevers at this time.  Patient also complaining of abdominal pain, was given of fentanyl en route to ED.

## 2021-06-09 NOTE — ED Notes (Signed)
ED Provider at bedside. 

## 2021-06-10 ENCOUNTER — Other Ambulatory Visit: Payer: Self-pay

## 2021-06-10 ENCOUNTER — Encounter (HOSPITAL_COMMUNITY): Payer: Self-pay | Admitting: Family Medicine

## 2021-06-10 DIAGNOSIS — E860 Dehydration: Secondary | ICD-10-CM | POA: Diagnosis present

## 2021-06-10 DIAGNOSIS — Z79899 Other long term (current) drug therapy: Secondary | ICD-10-CM | POA: Diagnosis not present

## 2021-06-10 DIAGNOSIS — Z7902 Long term (current) use of antithrombotics/antiplatelets: Secondary | ICD-10-CM | POA: Diagnosis not present

## 2021-06-10 DIAGNOSIS — Z7982 Long term (current) use of aspirin: Secondary | ICD-10-CM | POA: Diagnosis not present

## 2021-06-10 DIAGNOSIS — E1110 Type 2 diabetes mellitus with ketoacidosis without coma: Secondary | ICD-10-CM | POA: Diagnosis present

## 2021-06-10 DIAGNOSIS — I10 Essential (primary) hypertension: Secondary | ICD-10-CM | POA: Diagnosis present

## 2021-06-10 DIAGNOSIS — Z808 Family history of malignant neoplasm of other organs or systems: Secondary | ICD-10-CM | POA: Diagnosis not present

## 2021-06-10 DIAGNOSIS — Z833 Family history of diabetes mellitus: Secondary | ICD-10-CM | POA: Diagnosis not present

## 2021-06-10 DIAGNOSIS — I774 Celiac artery compression syndrome: Secondary | ICD-10-CM | POA: Diagnosis present

## 2021-06-10 DIAGNOSIS — K219 Gastro-esophageal reflux disease without esophagitis: Secondary | ICD-10-CM | POA: Diagnosis present

## 2021-06-10 DIAGNOSIS — Z9049 Acquired absence of other specified parts of digestive tract: Secondary | ICD-10-CM | POA: Diagnosis not present

## 2021-06-10 DIAGNOSIS — R112 Nausea with vomiting, unspecified: Secondary | ICD-10-CM | POA: Diagnosis present

## 2021-06-10 DIAGNOSIS — Z823 Family history of stroke: Secondary | ICD-10-CM | POA: Diagnosis not present

## 2021-06-10 DIAGNOSIS — Z765 Malingerer [conscious simulation]: Secondary | ICD-10-CM | POA: Diagnosis not present

## 2021-06-10 DIAGNOSIS — I252 Old myocardial infarction: Secondary | ICD-10-CM | POA: Diagnosis not present

## 2021-06-10 DIAGNOSIS — I2510 Atherosclerotic heart disease of native coronary artery without angina pectoris: Secondary | ICD-10-CM

## 2021-06-10 DIAGNOSIS — Z7984 Long term (current) use of oral hypoglycemic drugs: Secondary | ICD-10-CM | POA: Diagnosis not present

## 2021-06-10 DIAGNOSIS — N179 Acute kidney failure, unspecified: Secondary | ICD-10-CM

## 2021-06-10 DIAGNOSIS — U071 COVID-19: Secondary | ICD-10-CM

## 2021-06-10 DIAGNOSIS — E861 Hypovolemia: Secondary | ICD-10-CM | POA: Diagnosis present

## 2021-06-10 DIAGNOSIS — Z87891 Personal history of nicotine dependence: Secondary | ICD-10-CM | POA: Diagnosis not present

## 2021-06-10 DIAGNOSIS — E871 Hypo-osmolality and hyponatremia: Secondary | ICD-10-CM | POA: Diagnosis present

## 2021-06-10 DIAGNOSIS — I255 Ischemic cardiomyopathy: Secondary | ICD-10-CM | POA: Diagnosis present

## 2021-06-10 DIAGNOSIS — Z9114 Patient's other noncompliance with medication regimen: Secondary | ICD-10-CM | POA: Diagnosis not present

## 2021-06-10 LAB — COMPREHENSIVE METABOLIC PANEL
ALT: 50 U/L — ABNORMAL HIGH (ref 0–44)
AST: 26 U/L (ref 15–41)
Albumin: 3.9 g/dL (ref 3.5–5.0)
Alkaline Phosphatase: 124 U/L (ref 38–126)
Anion gap: 19 — ABNORMAL HIGH (ref 5–15)
BUN: 20 mg/dL (ref 6–20)
CO2: 17 mmol/L — ABNORMAL LOW (ref 22–32)
Calcium: 10 mg/dL (ref 8.9–10.3)
Chloride: 94 mmol/L — ABNORMAL LOW (ref 98–111)
Creatinine, Ser: 1.61 mg/dL — ABNORMAL HIGH (ref 0.61–1.24)
GFR, Estimated: 53 mL/min — ABNORMAL LOW (ref >60)
Glucose, Bld: 515 mg/dL (ref 70–99)
Potassium: 5 mmol/L (ref 3.5–5.1)
Sodium: 130 mmol/L — ABNORMAL LOW (ref 135–145)
Total Bilirubin: 1.2 mg/dL (ref 0.3–1.2)
Total Protein: 9.2 g/dL — ABNORMAL HIGH (ref 6.5–8.1)

## 2021-06-10 LAB — CBG MONITORING, ED
Glucose-Capillary: 411 mg/dL — ABNORMAL HIGH (ref 70–99)
Glucose-Capillary: 340 mg/dL — ABNORMAL HIGH (ref 70–99)
Glucose-Capillary: 381 mg/dL — ABNORMAL HIGH (ref 70–99)
Glucose-Capillary: 246 mg/dL — ABNORMAL HIGH (ref 70–99)
Glucose-Capillary: 164 mg/dL — ABNORMAL HIGH (ref 70–99)
Glucose-Capillary: 169 mg/dL — ABNORMAL HIGH (ref 70–99)
Glucose-Capillary: 150 mg/dL — ABNORMAL HIGH (ref 70–99)
Glucose-Capillary: 154 mg/dL — ABNORMAL HIGH (ref 70–99)
Glucose-Capillary: 121 mg/dL — ABNORMAL HIGH (ref 70–99)

## 2021-06-10 LAB — BASIC METABOLIC PANEL
Anion gap: 6 (ref 5–15)
Anion gap: 9 (ref 5–15)
Anion gap: 11 (ref 5–15)
Anion gap: 7 (ref 5–15)
Anion gap: 9 (ref 5–15)
BUN: 15 mg/dL (ref 6–20)
BUN: 12 mg/dL (ref 6–20)
BUN: 11 mg/dL (ref 6–20)
BUN: 11 mg/dL (ref 6–20)
BUN: 11 mg/dL (ref 6–20)
CO2: 19 mmol/L — ABNORMAL LOW (ref 22–32)
CO2: 19 mmol/L — ABNORMAL LOW (ref 22–32)
CO2: 20 mmol/L — ABNORMAL LOW (ref 22–32)
CO2: 18 mmol/L — ABNORMAL LOW (ref 22–32)
CO2: 17 mmol/L — ABNORMAL LOW (ref 22–32)
Calcium: 7.8 mg/dL — ABNORMAL LOW (ref 8.9–10.3)
Calcium: 8.3 mg/dL — ABNORMAL LOW (ref 8.9–10.3)
Calcium: 8.5 mg/dL — ABNORMAL LOW (ref 8.9–10.3)
Calcium: 8.2 mg/dL — ABNORMAL LOW (ref 8.9–10.3)
Calcium: 8.3 mg/dL — ABNORMAL LOW (ref 8.9–10.3)
Chloride: 104 mmol/L (ref 98–111)
Chloride: 107 mmol/L (ref 98–111)
Chloride: 105 mmol/L (ref 98–111)
Chloride: 107 mmol/L (ref 98–111)
Chloride: 105 mmol/L (ref 98–111)
Creatinine, Ser: 1.2 mg/dL (ref 0.61–1.24)
Creatinine, Ser: 1.21 mg/dL (ref 0.61–1.24)
Creatinine, Ser: 1.32 mg/dL — ABNORMAL HIGH (ref 0.61–1.24)
Creatinine, Ser: 1.03 mg/dL (ref 0.61–1.24)
Creatinine, Ser: 1.15 mg/dL (ref 0.61–1.24)
GFR, Estimated: >60 mL/min (ref >60)
GFR, Estimated: >60 mL/min (ref >60)
GFR, Estimated: >60 mL/min (ref >60)
GFR, Estimated: >60 mL/min (ref >60)
GFR, Estimated: >60 mL/min (ref >60)
Glucose, Bld: 372 mg/dL — ABNORMAL HIGH (ref 70–99)
Glucose, Bld: 170 mg/dL — ABNORMAL HIGH (ref 70–99)
Glucose, Bld: 129 mg/dL — ABNORMAL HIGH (ref 70–99)
Glucose, Bld: 163 mg/dL — ABNORMAL HIGH (ref 70–99)
Glucose, Bld: 214 mg/dL — ABNORMAL HIGH (ref 70–99)
Potassium: 4.3 mmol/L (ref 3.5–5.1)
Potassium: 4.1 mmol/L (ref 3.5–5.1)
Potassium: 3.9 mmol/L (ref 3.5–5.1)
Potassium: 3.9 mmol/L (ref 3.5–5.1)
Potassium: 4 mmol/L (ref 3.5–5.1)
Sodium: 129 mmol/L — ABNORMAL LOW (ref 135–145)
Sodium: 135 mmol/L (ref 135–145)
Sodium: 136 mmol/L (ref 135–145)
Sodium: 132 mmol/L — ABNORMAL LOW (ref 135–145)
Sodium: 131 mmol/L — ABNORMAL LOW (ref 135–145)

## 2021-06-10 LAB — D-DIMER, QUANTITATIVE: D-Dimer, Quant: 0.3 ug/mL-FEU (ref 0.00–0.50)

## 2021-06-10 LAB — CBC WITH DIFFERENTIAL/PLATELET
Abs Immature Granulocytes: 0.05 10*3/uL (ref 0.00–0.07)
Basophils Absolute: 0 10*3/uL (ref 0.0–0.1)
Basophils Relative: 0 %
Eosinophils Absolute: 0 10*3/uL (ref 0.0–0.5)
Eosinophils Relative: 0 %
HCT: 50.5 % (ref 39.0–52.0)
Hemoglobin: 18.2 g/dL — ABNORMAL HIGH (ref 13.0–17.0)
Immature Granulocytes: 1 %
Lymphocytes Relative: 18 %
Lymphs Abs: 1.6 10*3/uL (ref 0.7–4.0)
MCH: 31.4 pg (ref 26.0–34.0)
MCHC: 36 g/dL (ref 30.0–36.0)
MCV: 87.1 fL (ref 80.0–100.0)
Monocytes Absolute: 0.5 10*3/uL (ref 0.1–1.0)
Monocytes Relative: 6 %
Neutro Abs: 6.5 10*3/uL (ref 1.7–7.7)
Neutrophils Relative %: 75 %
Platelets: 359 10*3/uL (ref 150–400)
RBC: 5.8 MIL/uL (ref 4.22–5.81)
RDW: 11.9 % (ref 11.5–15.5)
WBC: 8.7 10*3/uL (ref 4.0–10.5)
nRBC: 0 % (ref 0.0–0.2)

## 2021-06-10 LAB — URINALYSIS, ROUTINE W REFLEX MICROSCOPIC
Bilirubin Urine: NEGATIVE
Glucose, UA: >=500 mg/dL — AB
Ketones, ur: 15 mg/dL — AB
Leukocytes,Ua: NEGATIVE
Nitrite: NEGATIVE
Protein, ur: 30 mg/dL — AB
Specific Gravity, Urine: 1.02 (ref 1.005–1.030)
pH: 5.5 (ref 5.0–8.0)

## 2021-06-10 LAB — I-STAT VENOUS BLOOD GAS, ED
Acid-base deficit: 2 mmol/L (ref 0.0–2.0)
Bicarbonate: 21.2 mmol/L (ref 20.0–28.0)
Calcium, Ion: 1.15 mmol/L (ref 1.15–1.40)
HCT: 55 % — ABNORMAL HIGH (ref 39.0–52.0)
Hemoglobin: 18.7 g/dL — ABNORMAL HIGH (ref 13.0–17.0)
O2 Saturation: 59 %
Potassium: 5.1 mmol/L (ref 3.5–5.1)
Sodium: 131 mmol/L — ABNORMAL LOW (ref 135–145)
TCO2: 22 mmol/L (ref 22–32)
pCO2, Ven: 31.1 mmHg — ABNORMAL LOW (ref 44.0–60.0)
pH, Ven: 7.441 — ABNORMAL HIGH (ref 7.250–7.430)
pO2, Ven: 29 mmHg — CL (ref 32.0–45.0)

## 2021-06-10 LAB — RESP PANEL BY RT-PCR (FLU A&B, COVID) ARPGX2
Influenza A by PCR: NEGATIVE
Influenza B by PCR: NEGATIVE
SARS Coronavirus 2 by RT PCR: POSITIVE — AB

## 2021-06-10 LAB — I-STAT CHEM 8, ED
BUN: 23 mg/dL — ABNORMAL HIGH (ref 6–20)
Calcium, Ion: 1.16 mmol/L (ref 1.15–1.40)
Chloride: 99 mmol/L (ref 98–111)
Creatinine, Ser: 1.4 mg/dL — ABNORMAL HIGH (ref 0.61–1.24)
Glucose, Bld: 505 mg/dL (ref 70–99)
HCT: 57 % — ABNORMAL HIGH (ref 39.0–52.0)
Hemoglobin: 19.4 g/dL — ABNORMAL HIGH (ref 13.0–17.0)
Potassium: 5.1 mmol/L (ref 3.5–5.1)
Sodium: 131 mmol/L — ABNORMAL LOW (ref 135–145)
TCO2: 20 mmol/L — ABNORMAL LOW (ref 22–32)

## 2021-06-10 LAB — HEMOGLOBIN A1C
Hgb A1c MFr Bld: 14.2 % — ABNORMAL HIGH (ref 4.8–5.6)
Mean Plasma Glucose: 360.84 mg/dL

## 2021-06-10 LAB — LACTATE DEHYDROGENASE: LDH: 98 U/L (ref 98–192)

## 2021-06-10 LAB — GLUCOSE, CAPILLARY: Glucose-Capillary: 198 mg/dL — ABNORMAL HIGH (ref 70–99)

## 2021-06-10 LAB — TROPONIN I (HIGH SENSITIVITY)
Troponin I (High Sensitivity): 23 ng/L — ABNORMAL HIGH (ref <18)
Troponin I (High Sensitivity): 23 ng/L — ABNORMAL HIGH (ref <18)

## 2021-06-10 LAB — LACTIC ACID, PLASMA
Lactic Acid, Venous: 1.6 mmol/L (ref 0.5–1.9)
Lactic Acid, Venous: 3 mmol/L (ref 0.5–1.9)
Lactic Acid, Venous: 3.2 mmol/L (ref 0.5–1.9)

## 2021-06-10 LAB — LIPASE, BLOOD: Lipase: 30 U/L (ref 11–51)

## 2021-06-10 LAB — C-REACTIVE PROTEIN: CRP: 3.7 mg/dL — ABNORMAL HIGH (ref <1.0)

## 2021-06-10 LAB — PROCALCITONIN: Procalcitonin: 0.87 ng/mL

## 2021-06-10 LAB — BETA-HYDROXYBUTYRIC ACID
Beta-Hydroxybutyric Acid: 0.2 mmol/L (ref 0.05–0.27)
Beta-Hydroxybutyric Acid: 0.07 mmol/L (ref 0.05–0.27)
Beta-Hydroxybutyric Acid: 0.2 mmol/L (ref 0.05–0.27)

## 2021-06-10 LAB — MAGNESIUM: Magnesium: 1.7 mg/dL (ref 1.7–2.4)

## 2021-06-10 LAB — BRAIN NATRIURETIC PEPTIDE: B Natriuretic Peptide: 20 pg/mL (ref 0.0–100.0)

## 2021-06-10 LAB — PROTIME-INR
INR: 0.9 (ref 0.8–1.2)
Prothrombin Time: 12.3 seconds (ref 11.4–15.2)

## 2021-06-10 LAB — URINALYSIS, MICROSCOPIC (REFLEX)

## 2021-06-10 LAB — APTT: aPTT: 30 seconds (ref 24–36)

## 2021-06-10 LAB — HIV ANTIBODY (ROUTINE TESTING W REFLEX): HIV Screen 4th Generation wRfx: NONREACTIVE

## 2021-06-10 LAB — PHOSPHORUS: Phosphorus: 2.9 mg/dL (ref 2.5–4.6)

## 2021-06-10 MED ORDER — ASPIRIN 81 MG PO CHEW
81.0000 mg | CHEWABLE_TABLET | Freq: Every day | ORAL | Status: DC
  Administered 2021-06-10 – 2021-06-12 (×3): 81 mg via ORAL
  Filled 2021-06-10 (×3): qty 1

## 2021-06-10 MED ORDER — ATORVASTATIN CALCIUM 80 MG PO TABS
80.0000 mg | ORAL_TABLET | Freq: Every day | ORAL | Status: DC
  Administered 2021-06-10 – 2021-06-11 (×2): 80 mg via ORAL
  Filled 2021-06-10 (×2): qty 1

## 2021-06-10 MED ORDER — SODIUM CHLORIDE 0.9 % IV BOLUS (SEPSIS)
1000.0000 mL | Freq: Once | INTRAVENOUS | Status: AC
  Administered 2021-06-10: 1000 mL via INTRAVENOUS

## 2021-06-10 MED ORDER — INSULIN ASPART 100 UNIT/ML IJ SOLN
0.0000 [IU] | Freq: Three times a day (TID) | INTRAMUSCULAR | Status: DC
  Administered 2021-06-10: 1 [IU] via SUBCUTANEOUS
  Administered 2021-06-11: 3 [IU] via SUBCUTANEOUS
  Administered 2021-06-11: 5 [IU] via SUBCUTANEOUS
  Administered 2021-06-11: 2 [IU] via SUBCUTANEOUS
  Administered 2021-06-12 (×2): 5 [IU] via SUBCUTANEOUS

## 2021-06-10 MED ORDER — INSULIN GLARGINE-YFGN 100 UNIT/ML ~~LOC~~ SOLN
20.0000 [IU] | SUBCUTANEOUS | Status: DC
  Administered 2021-06-10: 20 [IU] via SUBCUTANEOUS
  Filled 2021-06-10 (×2): qty 0.2

## 2021-06-10 MED ORDER — METOCLOPRAMIDE HCL 5 MG/ML IJ SOLN
5.0000 mg | Freq: Four times a day (QID) | INTRAMUSCULAR | Status: DC | PRN
  Administered 2021-06-10: 5 mg via INTRAVENOUS
  Filled 2021-06-10: qty 2

## 2021-06-10 MED ORDER — INSULIN GLARGINE-YFGN 100 UNIT/ML ~~LOC~~ SOLN: 12.0000 [IU] | Freq: Every day | SUBCUTANEOUS | Status: DC

## 2021-06-10 MED ORDER — INSULIN ASPART 100 UNIT/ML IJ SOLN: 0.0000 [IU] | Freq: Every day | INTRAMUSCULAR | Status: DC

## 2021-06-10 MED ORDER — FENTANYL CITRATE PF 50 MCG/ML IJ SOSY
50.0000 ug | PREFILLED_SYRINGE | Freq: Once | INTRAMUSCULAR | Status: AC | PRN
  Administered 2021-06-10: 50 ug via INTRAVENOUS
  Filled 2021-06-10: qty 1

## 2021-06-10 MED ORDER — DEXTROSE 50 % IV SOLN: 0.0000 mL | INTRAVENOUS | Status: DC | PRN

## 2021-06-10 MED ORDER — DEXTROSE IN LACTATED RINGERS 5 % IV SOLN: INTRAVENOUS | Status: DC

## 2021-06-10 MED ORDER — CARVEDILOL 25 MG PO TABS
25.0000 mg | ORAL_TABLET | Freq: Two times a day (BID) | ORAL | Status: DC
  Administered 2021-06-10 – 2021-06-12 (×5): 25 mg via ORAL
  Filled 2021-06-10: qty 1
  Filled 2021-06-10: qty 2
  Filled 2021-06-10 (×3): qty 1

## 2021-06-10 MED ORDER — ONDANSETRON HCL 4 MG/2ML IJ SOLN
4.0000 mg | Freq: Once | INTRAMUSCULAR | Status: AC
Start: 2021-06-10 — End: 2021-06-10
  Administered 2021-06-10: 4 mg via INTRAVENOUS
  Filled 2021-06-10: qty 2

## 2021-06-10 MED ORDER — INSULIN GLARGINE-YFGN 100 UNIT/ML ~~LOC~~ SOLN: 20.0000 [IU] | Freq: Every day | SUBCUTANEOUS | Status: DC

## 2021-06-10 MED ORDER — ALUM & MAG HYDROXIDE-SIMETH 200-200-20 MG/5ML PO SUSP
30.0000 mL | ORAL | Status: DC | PRN
  Administered 2021-06-10: 30 mL via ORAL
  Filled 2021-06-10 (×2): qty 30

## 2021-06-10 MED ORDER — GUAIFENESIN-DM 100-10 MG/5ML PO SYRP
10.0000 mL | ORAL_SOLUTION | ORAL | Status: DC | PRN
  Administered 2021-06-11: 10 mL via ORAL
  Filled 2021-06-10: qty 10

## 2021-06-10 MED ORDER — METRONIDAZOLE 500 MG/100ML IV SOLN
500.0000 mg | Freq: Once | INTRAVENOUS | Status: AC
  Administered 2021-06-10: 500 mg via INTRAVENOUS
  Filled 2021-06-10: qty 100

## 2021-06-10 MED ORDER — INSULIN ASPART 100 UNIT/ML IJ SOLN
0.0000 [IU] | Freq: Every day | INTRAMUSCULAR | Status: DC
  Administered 2021-06-11: 3 [IU] via SUBCUTANEOUS

## 2021-06-10 MED ORDER — PANTOPRAZOLE SODIUM 40 MG PO TBEC
40.0000 mg | DELAYED_RELEASE_TABLET | Freq: Every day | ORAL | Status: DC
  Administered 2021-06-10 – 2021-06-12 (×3): 40 mg via ORAL
  Filled 2021-06-10 (×3): qty 1

## 2021-06-10 MED ORDER — IBUPROFEN 400 MG PO TABS
400.0000 mg | ORAL_TABLET | Freq: Four times a day (QID) | ORAL | Status: DC | PRN
  Administered 2021-06-10: 400 mg via ORAL
  Filled 2021-06-10: qty 1

## 2021-06-10 MED ORDER — LIVING WELL WITH DIABETES BOOK
Freq: Once | Status: AC
  Filled 2021-06-10: qty 1

## 2021-06-10 MED ORDER — LACTATED RINGERS IV BOLUS
1000.0000 mL | Freq: Once | INTRAVENOUS | Status: AC
  Administered 2021-06-10: 1000 mL via INTRAVENOUS

## 2021-06-10 MED ORDER — SODIUM CHLORIDE 0.9 % IV SOLN: 2.0000 g | Freq: Three times a day (TID) | INTRAVENOUS | Status: DC

## 2021-06-10 MED ORDER — LACTATED RINGERS IV SOLN: INTRAVENOUS | Status: DC

## 2021-06-10 MED ORDER — ONDANSETRON HCL 4 MG/2ML IJ SOLN
4.0000 mg | Freq: Four times a day (QID) | INTRAMUSCULAR | Status: DC | PRN
  Administered 2021-06-10: 4 mg via INTRAVENOUS
  Filled 2021-06-10: qty 2

## 2021-06-10 MED ORDER — INSULIN REGULAR(HUMAN) IN NACL 100-0.9 UT/100ML-% IV SOLN
INTRAVENOUS | Status: DC
  Administered 2021-06-10: 15 [IU]/h via INTRAVENOUS
  Filled 2021-06-10: qty 100

## 2021-06-10 MED ORDER — SODIUM CHLORIDE 0.9 % IV SOLN
2.0000 g | Freq: Once | INTRAVENOUS | Status: AC
  Administered 2021-06-10: 2 g via INTRAVENOUS
  Filled 2021-06-10: qty 2

## 2021-06-10 MED ORDER — INSULIN ASPART 100 UNIT/ML IJ SOLN: 0.0000 [IU] | Freq: Three times a day (TID) | INTRAMUSCULAR | Status: DC

## 2021-06-10 MED ORDER — ENOXAPARIN SODIUM 40 MG/0.4ML IJ SOSY
40.0000 mg | PREFILLED_SYRINGE | Freq: Every day | INTRAMUSCULAR | Status: DC
  Administered 2021-06-10 – 2021-06-12 (×3): 40 mg via SUBCUTANEOUS
  Filled 2021-06-10 (×3): qty 0.4

## 2021-06-10 MED ORDER — SODIUM CHLORIDE 0.9 % IV SOLN
100.0000 mg | Freq: Every day | INTRAVENOUS | Status: DC
  Administered 2021-06-11 – 2021-06-12 (×2): 100 mg via INTRAVENOUS
  Filled 2021-06-10 (×2): qty 20

## 2021-06-10 MED ORDER — MORPHINE SULFATE (PF) 2 MG/ML IV SOLN
0.5000 mg | INTRAVENOUS | Status: DC | PRN
  Administered 2021-06-10 – 2021-06-11 (×2): 0.5 mg via INTRAVENOUS
  Filled 2021-06-10 (×2): qty 1

## 2021-06-10 MED ORDER — SODIUM CHLORIDE 0.9 % IV SOLN
200.0000 mg | Freq: Once | INTRAVENOUS | Status: AC
  Administered 2021-06-10: 200 mg via INTRAVENOUS
  Filled 2021-06-10: qty 40

## 2021-06-10 MED ORDER — ACETAMINOPHEN 325 MG PO TABS
650.0000 mg | ORAL_TABLET | ORAL | Status: DC | PRN
  Administered 2021-06-11: 650 mg via ORAL
  Filled 2021-06-10 (×2): qty 2

## 2021-06-10 MED ORDER — HYDRALAZINE HCL 25 MG PO TABS
25.0000 mg | ORAL_TABLET | Freq: Three times a day (TID) | ORAL | Status: DC | PRN
  Administered 2021-06-10 – 2021-06-11 (×2): 25 mg via ORAL
  Filled 2021-06-10 (×3): qty 1

## 2021-06-10 NOTE — Sepsis Progress Note (Signed)
Elink Following Code Sepsis  

## 2021-06-10 NOTE — Progress Notes (Signed)
Pt seen and examined at bedside.  He was admitted earlier this am by Dr Myna Hidalgo  Kirk Barker is a pleasant 46 y.o. male with medical history significant for type 2 diabetes mellitus, CAD, ischemic cardiomyopathy, hypertension, and GERD, now presenting to the emergency department with aches, malaise, fatigue, nausea, vomiting, and abdominal discomfort.  Patient reports that he developed general aches and malaise couple days ago, tested positive for COVID-19, and has gone on to develop generalized abdominal discomfort with nausea, and nonbloody vomiting.  He was found to be in DKA.  He was started on IV insulin and transitioned to semglee and SSI.   Kirk Poisson, MD

## 2021-06-10 NOTE — Progress Notes (Signed)
Pharmacy Antibiotic Note  Kirk Barker is a 46 y.o. male admitted on 06/09/2021 with  ?intra-abdominal infection .  Pharmacy has been consulted for Cefepime dosing. WBC WNL. Mild bump in Scr.   Plan: Cefepime 2g IV q8h Trend WBC, temp, renal function  F/U infectious work-up   Height: 6' (182.9 cm) Weight: 104.3 kg (230 lb) IBW/kg (Calculated) : 77.6  Temp (24hrs), Avg:98.3 F (36.8 C), Min:98.3 F (36.8 C), Max:98.3 F (36.8 C)  Recent Labs  Lab 06/10/21 0010 06/10/21 0036 06/10/21 0131  WBC 8.7  --   --   CREATININE 1.61* 1.40*  --   LATICACIDVEN 3.2*  --  3.0*    Estimated Creatinine Clearance: 81.5 mL/min (A) (by C-G formula based on SCr of 1.4 mg/dL (H)).    Allergies  Allergen Reactions   Bee Venom    Cat Hair Extract Anxiety, Itching and Other (See Comments)    Itching , watering eyes  Itching , watering eyes  Itching , watering eyes  Itching , watering eyes     Other Itching    Abran Duke, PharmD, BCPS Clinical Pharmacist Phone: [phone]

## 2021-06-10 NOTE — H&P (Signed)
History and Physical    Kirk Barker [id number] DOB: 1975-08-11 DOA: 06/09/2021  PCP: Eulas Post, MD   Patient coming from: Home   Chief Complaint: Fatigue, aches, N/V   HPI: Kirk Barker is a pleasant 46 y.o. male with medical history significant for type 2 diabetes mellitus, CAD, ischemic cardiomyopathy, hypertension, and GERD, now presenting to the emergency department with aches, malaise, fatigue, nausea, vomiting, and abdominal discomfort.  Patient reports that he developed general aches and malaise couple days ago, tested positive for COVID-19, and has gone on to develop generalized abdominal discomfort with nausea, and nonbloody vomiting.  He has had some loose stools and loss of appetite as well.  Describes a burning sensation in his lower chest and upper abdomen that he attributes to vomiting and indigestion.  He has not been taking any medications recently and had been doing fairly well until the current illness.  ED Course: Upon arrival to the ED, patient is found to be afebrile, saturating 100% on 3 L/min of supplemental oxygen, tachycardic, slightly tachypneic, and with stable blood pressure.  EKG features sinus tachycardia with rate 136.  Chest x-ray negative for acute cardiopulmonary disease.  Chemistry panel with glucose 515, bicarbonate 17, anion gap 19, and creatinine 1.61.  Lactic acid elevated to 3.2.  Urinalysis with glucosuria, proteinuria, and ketonuria.  Blood cultures were collected in the ED and the patient was given 4 L of IV fluids, cefepime, and Flagyl.  Review of Systems:  All other systems reviewed and apart from HPI, are negative.  Past Medical History:  Diagnosis Date   Median arcuate ligament syndrome [facility])     Past Surgical History:  Procedure Laterality Date   CHOLECYSTECTOMY     LEFT HEART CATH AND CORONARY ANGIOGRAPHY N/A 09/15/2019   Procedure: LEFT HEART CATH AND CORONARY ANGIOGRAPHY;  Surgeon: Belva Crome, MD;  Location: [facility];  Service: Cardiovascular;  Laterality: N/A;    Social History:   reports that he quit smoking about 20 months ago. His smoking use included cigarettes. He smoked an average of .5 packs per day. He has never used smokeless tobacco. He reports current alcohol use. He reports that he does not use drugs.  Allergies  Allergen Reactions   Bee Venom    Cat Hair Extract Anxiety, Itching and Other (See Comments)    Itching , watering eyes  Itching , watering eyes  Itching , watering eyes  Itching , watering eyes     Other Itching    Family History  Problem Relation Age of Onset   Stroke Mother    Diabetes Father    Brain cancer Maternal Grandmother      Prior to Admission medications   Medication Sig Start Date End Date Taking? Authorizing Provider  aspirin 81 MG chewable tablet Chew 1 tablet (81 mg total) by mouth daily. 09/17/19   Sande Rives E, PA-C  atorvastatin (LIPITOR) 80 MG tablet TAKE 1 TABLET (80 MG TOTAL) BY MOUTH DAILY AT 6 PM. 12/21/20   Buford Dresser, MD  BRILINTA 90 MG TABS tablet TAKE 1 TABLET (90 MG TOTAL) BY MOUTH 2 (TWO) TIMES DAILY. 10/22/20   Sande Rives E, PA-C  carvedilol (COREG) 25 MG tablet TAKE 1 TABLET (25 MG TOTAL) BY MOUTH 2 (TWO) TIMES DAILY WITH A MEAL. 12/21/20   Buford Dresser, MD  FARXIGA 10 MG TABS tablet TAKE 10 MG BY MOUTH DAILY BEFORE BREAKFAST. 12/21/20   Buford Dresser, MD  losartan (COZAAR)  50 MG tablet TAKE 1 TABLET BY MOUTH EVERY DAY 12/21/20   Jodelle Red, MD  metFORMIN (GLUCOPHAGE) 500 MG tablet TAKE TWO TABLETS BY MOUTH TWICE DAILY 12/22/20   Burchette, Elberta Fortis, MD  nitroGLYCERIN (NITROSTAT) 0.4 MG SL tablet Place 1 tablet (0.4 mg total) under the tongue every 5 (five) minutes as needed for chest pain. 12/18/19   Jodelle Red, MD  pantoprazole (PROTONIX) 40 MG tablet TAKE 1 TABLET BY MOUTH EVERY DAY 12/22/20   Kristian Covey, MD    Physical Exam: Vitals:   06/10/21 0300 06/10/21  0330 06/10/21 0400 06/10/21 0545  BP: (!) 114/99 (!) 160/98 (!) 181/118 (!) 174/114  Pulse: (!) 114 (!) 115 (!) 122 (!) 130  Resp: (!) 22 (!) 23 (!) 21 15  Temp:      TempSrc:      SpO2: 99% 100% 97% 98%  Weight:      Height:        Constitutional: NAD, calm  Eyes: PERTLA, lids and conjunctivae normal ENMT: Mucous membranes are moist. Posterior pharynx clear of any exudate or lesions.   Neck: supple, no masses  Respiratory:  no wheezing, no crackles. No accessory muscle use.  Cardiovascular: Rate ~110 and regular. No extremity edema.   Abdomen: No distension, no tenderness, soft. Bowel sounds active.  Musculoskeletal: no clubbing / cyanosis. No joint deformity upper and lower extremities.   Skin: no significant rashes, lesions, ulcers. Warm, dry, well-perfused. Neurologic: CN 2-12 grossly intact. Moving all extremities. Alert and oriented.  Psychiatric: Pleasant. Cooperative.    Labs and Imaging on Admission: I have personally reviewed following labs and imaging studies  CBC: Recent Labs  Lab 06/10/21 0010 06/10/21 0036  WBC 8.7  --   NEUTROABS 6.5  --   HGB 18.2* 18.7*   19.4*  HCT 50.5 55.0*   57.0*  MCV 87.1  --   PLT 359  --    Basic Metabolic Panel: Recent Labs  Lab 06/10/21 0010 06/10/21 0036  NA 130* 131*   131*  K 5.0 5.1   5.1  CL 94* 99  CO2 17*  --   GLUCOSE 515* 505*  BUN 20 23*  CREATININE 1.61* 1.40*  CALCIUM 10.0  --    GFR: Estimated Creatinine Clearance: 81.5 mL/min (A) (by C-G formula based on SCr of 1.4 mg/dL (H)). Liver Function Tests: Recent Labs  Lab 06/10/21 0010  AST 26  ALT 50*  ALKPHOS 124  BILITOT 1.2  PROT 9.2*  ALBUMIN 3.9   Recent Labs  Lab 06/10/21 0010  LIPASE 30   No results for input(s): AMMONIA in the last 168 hours. Coagulation Profile: Recent Labs  Lab 06/10/21 0010  INR 0.9   Cardiac Enzymes: No results for input(s): CKTOTAL, CKMB, CKMBINDEX, TROPONINI in the last 168 hours. BNP (last 3 results) No  results for input(s): PROBNP in the last 8760 hours. HbA1C: No results for input(s): HGBA1C in the last 72 hours. CBG: Recent Labs  Lab 06/09/21 2322 06/10/21 0152 06/10/21 0542  GLUCAP 517* 411* 340*   Lipid Profile: No results for input(s): CHOL, HDL, LDLCALC, TRIG, CHOLHDL, LDLDIRECT in the last 72 hours. Thyroid Function Tests: No results for input(s): TSH, T4TOTAL, FREET4, T3FREE, THYROIDAB in the last 72 hours. Anemia Panel: No results for input(s): VITAMINB12, FOLATE, FERRITIN, TIBC, IRON, RETICCTPCT in the last 72 hours. Urine analysis:    Component Value Date/Time   COLORURINE YELLOW 06/10/2021 0210   APPEARANCEUR CLEAR 06/10/2021 0210   LABSPEC  1.020 06/10/2021 0210   PHURINE 5.5 06/10/2021 0210   GLUCOSEU >=500 (A) 06/10/2021 0210   HGBUR TRACE (A) 06/10/2021 0210   BILIRUBINUR NEGATIVE 06/10/2021 0210   KETONESUR 15 (A) 06/10/2021 0210   PROTEINUR 30 (A) 06/10/2021 0210   NITRITE NEGATIVE 06/10/2021 0210   LEUKOCYTESUR NEGATIVE 06/10/2021 0210   Sepsis Labs: @LABRCNTIP (procalcitonin:4,lacticidven:4) ) Recent Results (from the past 240 hour(s))  Resp Panel by RT-PCR (Flu A&B, Covid)     Status: Abnormal   Collection Time: 06/09/21 11:31 PM   Specimen: Nasopharyngeal(NP) swabs in vial transport medium  Result Value Ref Range Status   SARS Coronavirus 2 by RT PCR POSITIVE (A) NEGATIVE Final    Comment: (NOTE) SARS-CoV-2 target nucleic acids are DETECTED.  The SARS-CoV-2 RNA is generally detectable in upper respiratory specimens during the acute phase of infection. Positive results are indicative of the presence of the identified virus, but do not rule out bacterial infection or co-infection with other pathogens not detected by the test. Clinical correlation with patient history and other diagnostic information is necessary to determine patient infection status. The expected result is Negative.  Fact Sheet for  Patients: EntrepreneurPulse.com.au  Fact Sheet for Healthcare Providers: IncredibleEmployment.be  This test is not yet approved or cleared by the [place] FDA and  has been authorized for detection and/or diagnosis of SARS-CoV-2 by FDA under an Emergency Use Authorization (EUA).  This EUA will remain in effect (meaning this test can be used) for the duration of  the COVID-19 declaration under Section 564(b)(1) of the A ct, 21 U.S.C. section 360bbb-3(b)(1), unless the authorization is terminated or revoked sooner.     Influenza A by PCR NEGATIVE NEGATIVE Final   Influenza B by PCR NEGATIVE NEGATIVE Final    Comment: (NOTE) The Xpert Xpress SARS-CoV-2/FLU/RSV plus assay is intended as an aid in the diagnosis of influenza from Nasopharyngeal swab specimens and should not be used as a sole basis for treatment. Nasal washings and aspirates are unacceptable for Xpert Xpress SARS-CoV-2/FLU/RSV testing.  Fact Sheet for Patients: EntrepreneurPulse.com.au  Fact Sheet for Healthcare Providers: IncredibleEmployment.be  This test is not yet approved or cleared by the [place] FDA and has been authorized for detection and/or diagnosis of SARS-CoV-2 by FDA under an Emergency Use Authorization (EUA). This EUA will remain in effect (meaning this test can be used) for the duration of the COVID-19 declaration under Section 564(b)(1) of the Act, 21 U.S.C. section 360bbb-3(b)(1), unless the authorization is terminated or revoked.  Performed at [facility], [place] [address] [place], [place] [address]      Radiological Exams on Admission: DG Chest Port 1 View  Result Date: 06/09/2021 CLINICAL DATA:  Possible sepsis EXAM: PORTABLE CHEST 1 VIEW COMPARISON:  09/13/2019 FINDINGS: The heart size and mediastinal contours are within normal limits. Both lungs are clear. The visualized skeletal structures are  unremarkable. IMPRESSION: No active disease. Electronically Signed   By: Donavan Foil M.D.   On: 06/09/2021 23:54    EKG: Independently reviewed. Sinus tachycardia, rate 136.   Assessment/Plan   1. DKA  - Diabetic with A1c 10.3% in 2021 who has not been taking any medications presents with abdominal discomfort and N/V and found to have serum glucose 515 with low serum bicarbonate, elevated AG, and urine ketones  - He was fluid resuscitated in ED  - Start IV insulin infusion with frequent CBGs and serial chemistry panels    2. AKI  - SCr 1.61, up from 1  in 2021  - Suspect this is acute prerenal azotemia in setting of N/V/D  - Continue IVF hydration, renally-dose medications, hold ARB, repeat chem panel    3. COVID-19  - Presents with aches, malaise, and N/V/D and found to have COVID-19  - Start remdesivir, check/trend inflammatory markers, continue isolation and supportive care    4. CAD; ischemic cardiomyopathy  - Hx of NSTEMI in April 2021 treated with DES to ramus; EF was 40-45% at that time  - He has not been taking any medications recently, appears hypovolemic in ED in setting of N/V/D  - Resume ASA, statin, and beta-blocker, monitor volume status    5. Hypertension  - Resume Coreg, hold ARB in light of increased creatinine    6. N/V  - Abdominal exam benign, likely related to COVID and mild DKA  - Continue supportive care with IVF, antiemetics, monitor electrolytes    DVT prophylaxis: Lovenox  Code Status: Full  Level of Care: Level of care: Progressive Family Communication: none present  Disposition Plan:  Patient is from: home  Anticipated d/c is to: Home  Anticipated d/c date is: 06/13/21 Patient currently: pending glycemic-control, repeat lactate, improved or stable renal function  Consults called: none  Admission status: Inpatient     Vianne Bulls, MD [facility]  06/10/2021, 6:04 AM

## 2021-06-10 NOTE — ED Notes (Signed)
Pt sitting up on side of bed c/o his chest burning, the same as when he originally came in.  Provider messaged for pain meds

## 2021-06-10 NOTE — ED Notes (Signed)
Provider notified of lactic acid

## 2021-06-10 NOTE — ED Provider Notes (Signed)
[facility] EMERGENCY DEPARTMENT Provider Note   CSN: [id number] Arrival date & time: 06/09/21  2312     History  Chief Complaint  Patient presents with   Tachycardia    Hyperglycemia    Hyperglycemia    Kirk Barker is a 46 y.o. male.  The history is provided by the patient and the spouse.  Hyperglycemia Severity:  Severe Onset quality:  Gradual Timing:  Constant Progression:  Worsening Chronicity:  New Relieved by:  Nothing Associated symptoms: abdominal pain, chest pain, fatigue, shortness of breath and vomiting   Patient with history of CAD, diabetes presents with vomiting, diarrhea and shortness of breath.  Patient reports over the past 24 hours he has had multiple episodes of both nonbloody vomiting and diarrhea.  He also reports mild cough and shortness of breath.  No hemoptysis.  He also reports chest burning and abdominal pain. He tested positive for COVID earlier in the day. He reports due to vomiting he has been unable to control his vomiting    Home Medications Prior to Admission medications   Medication Sig Start Date End Date Taking? Authorizing Provider  aspirin 81 MG chewable tablet Chew 1 tablet (81 mg total) by mouth daily. 09/17/19   Sande Rives E, PA-C  atorvastatin (LIPITOR) 80 MG tablet TAKE 1 TABLET (80 MG TOTAL) BY MOUTH DAILY AT 6 PM. 12/21/20   Buford Dresser, MD  BRILINTA 90 MG TABS tablet TAKE 1 TABLET (90 MG TOTAL) BY MOUTH 2 (TWO) TIMES DAILY. 10/22/20   Sande Rives E, PA-C  carvedilol (COREG) 25 MG tablet TAKE 1 TABLET (25 MG TOTAL) BY MOUTH 2 (TWO) TIMES DAILY WITH A MEAL. 12/21/20   Buford Dresser, MD  FARXIGA 10 MG TABS tablet TAKE 10 MG BY MOUTH DAILY BEFORE BREAKFAST. 12/21/20   Buford Dresser, MD  losartan (COZAAR) 50 MG tablet TAKE 1 TABLET BY MOUTH EVERY DAY 12/21/20   Buford Dresser, MD  metFORMIN (GLUCOPHAGE) 500 MG tablet TAKE TWO TABLETS BY MOUTH TWICE DAILY 12/22/20    Burchette, Alinda Sierras, MD  nitroGLYCERIN (NITROSTAT) 0.4 MG SL tablet Place 1 tablet (0.4 mg total) under the tongue every 5 (five) minutes as needed for chest pain. 12/18/19   Buford Dresser, MD  pantoprazole (PROTONIX) 40 MG tablet TAKE 1 TABLET BY MOUTH EVERY DAY 12/22/20   Burchette, Alinda Sierras, MD      Allergies    Bee venom, Cat hair extract, and Other    Review of Systems   Review of Systems  Constitutional:  Positive for fatigue.  Respiratory:  Positive for shortness of breath.   Cardiovascular:  Positive for chest pain.  Gastrointestinal:  Positive for abdominal pain and vomiting. Negative for blood in stool.  Neurological:  Positive for headaches.  All other systems reviewed and are negative.  Physical Exam Updated Vital Signs BP (!) 155/118 (BP Location: Right Arm)    Pulse (!) 137    Temp 98.3 F (36.8 C) (Oral)    Resp (!) 22    SpO2 100%  Physical Exam CONSTITUTIONAL: Well developed/well nourished, ill-appearing HEAD: Normocephalic/atraumatic EYES: EOMI/PERRL ENMT: Mucous membranes moist NECK: supple no meningeal signs SPINE/BACK:entire spine nontender CV: S1/S2 noted, tachycardic LUNGS: Lungs are clear to auscultation bilaterally, no apparent distress, tachypneic ABDOMEN: soft, mild diffuse tenderness, no rebound or guarding, bowel sounds noted throughout abdomen GU:no cva tenderness NEURO: Pt is awake/alert/appropriate, moves all extremitiesx4.  No facial droop.   EXTREMITIES: pulses normal/equal, full ROM SKIN: warm, color normal  PSYCH: Anxious  ED Results / Procedures / Treatments   Labs (all labs ordered are listed, but only abnormal results are displayed) Labs Reviewed  RESP PANEL BY RT-PCR (FLU A&B, COVID) ARPGX2 - Abnormal; Notable for the following components:      Result Value   SARS Coronavirus 2 by RT PCR POSITIVE (*)    All other components within normal limits  LACTIC ACID, PLASMA - Abnormal; Notable for the following components:   Lactic  Acid, Venous 3.2 (*)    All other components within normal limits  LACTIC ACID, PLASMA - Abnormal; Notable for the following components:   Lactic Acid, Venous 3.0 (*)    All other components within normal limits  COMPREHENSIVE METABOLIC PANEL - Abnormal; Notable for the following components:   Sodium 130 (*)    Chloride 94 (*)    CO2 17 (*)    Glucose, Bld 515 (*)    Creatinine, Ser 1.61 (*)    Total Protein 9.2 (*)    ALT 50 (*)    GFR, Estimated 53 (*)    Anion gap 19 (*)    All other components within normal limits  CBC WITH DIFFERENTIAL/PLATELET - Abnormal; Notable for the following components:   Hemoglobin 18.2 (*)    All other components within normal limits  URINALYSIS, ROUTINE W REFLEX MICROSCOPIC - Abnormal; Notable for the following components:   Glucose, UA >=500 (*)    Hgb urine dipstick TRACE (*)    Ketones, ur 15 (*)    Protein, ur 30 (*)    All other components within normal limits  URINALYSIS, MICROSCOPIC (REFLEX) - Abnormal; Notable for the following components:   Bacteria, UA RARE (*)    All other components within normal limits  CBG MONITORING, ED - Abnormal; Notable for the following components:   Glucose-Capillary 517 (*)    All other components within normal limits  I-STAT CHEM 8, ED - Abnormal; Notable for the following components:   Sodium 131 (*)    BUN 23 (*)    Creatinine, Ser 1.40 (*)    Glucose, Bld 505 (*)    TCO2 20 (*)    Hemoglobin 19.4 (*)    HCT 57.0 (*)    All other components within normal limits  I-STAT VENOUS BLOOD GAS, ED - Abnormal; Notable for the following components:   pH, Ven 7.441 (*)    pCO2, Ven 31.1 (*)    pO2, Ven 29.0 (*)    Sodium 131 (*)    HCT 55.0 (*)    Hemoglobin 18.7 (*)    All other components within normal limits  CBG MONITORING, ED - Abnormal; Notable for the following components:   Glucose-Capillary 411 (*)    All other components within normal limits  TROPONIN I (HIGH SENSITIVITY) - Abnormal; Notable for  the following components:   Troponin I (High Sensitivity) 23 (*)    All other components within normal limits  TROPONIN I (HIGH SENSITIVITY) - Abnormal; Notable for the following components:   Troponin I (High Sensitivity) 23 (*)    All other components within normal limits  CULTURE, BLOOD (ROUTINE X 2)  CULTURE, BLOOD (ROUTINE X 2)  PROTIME-INR  APTT  LIPASE, BLOOD  BLOOD GAS, VENOUS    EKG EKG Interpretation  Date/Time:  Thursday June 09 2021 23:20:30 EST Ventricular Rate:  136 PR Interval:  141 QRS Duration: 84 QT Interval:  297 QTC Calculation: 447 R Axis:   102 Text Interpretation: Sinus tachycardia Probable  lateral infarct, old Confirmed by Ripley Fraise [phone]) on 06/09/2021 11:32:36 PM  Radiology DG Chest Port 1 View  Result Date: 06/09/2021 CLINICAL DATA:  Possible sepsis EXAM: PORTABLE CHEST 1 VIEW COMPARISON:  09/13/2019 FINDINGS: The heart size and mediastinal contours are within normal limits. Both lungs are clear. The visualized skeletal structures are unremarkable. IMPRESSION: No active disease. Electronically Signed   By: Donavan Foil M.D.   On: 06/09/2021 23:54    Procedures .Critical Care Performed by: Ripley Fraise, MD Authorized by: Ripley Fraise, MD   Critical care provider statement:    Critical care time (minutes):  116   Critical care start time:  06/10/2021 12:04 AM   Critical care end time:  06/10/2021 2:00 AM   Critical care time was exclusive of:  Separately billable procedures and treating other patients   Critical care was necessary to treat or prevent imminent or life-threatening deterioration of the following conditions:  Sepsis, respiratory failure, renal failure, metabolic crisis, endocrine crisis and dehydration   Critical care was time spent personally by me on the following activities:  Development of treatment plan with patient or surrogate, discussions with consultants, obtaining history from patient or surrogate, examination  of patient, evaluation of patient's response to treatment, re-evaluation of patient's condition, pulse oximetry, ordering and review of radiographic studies, ordering and review of laboratory studies, ordering and performing treatments and interventions and review of old charts   I assumed direction of critical care for this patient from another provider in my specialty: no     Care discussed with: admitting provider      Medications Ordered in ED Medications  ceFEPIme (MAXIPIME) 2 g in sodium chloride 0.9 % 100 mL IVPB (has no administration in time range)  fentaNYL (SUBLIMAZE) injection 50 mcg (50 mcg Intravenous Given 06/10/21 0011)  lactated ringers bolus 1,000 mL (0 mLs Intravenous Stopped 06/10/21 0202)  ondansetron (ZOFRAN) injection 4 mg (4 mg Intravenous Given 06/10/21 0035)  sodium chloride 0.9 % bolus 1,000 mL (0 mLs Intravenous Stopped 06/10/21 0305)    And  sodium chloride 0.9 % bolus 1,000 mL (0 mLs Intravenous Stopped 06/10/21 0310)    And  sodium chloride 0.9 % bolus 1,000 mL (1,000 mLs Intravenous New Bag/Given 06/10/21 0201)  ceFEPIme (MAXIPIME) 2 g in sodium chloride 0.9 % 100 mL IVPB (0 g Intravenous Stopped 06/10/21 0201)  metroNIDAZOLE (FLAGYL) IVPB 500 mg (0 mg Intravenous Stopped 06/10/21 0311)    ED Course/ Medical Decision Making/ A&P Clinical Course as of 06/10/21 0330  Fri Jun 10, 2021  0006 Discussed with wife via phone.  She reports of the past 24 hours has had multiple episodes of vomiting and diarrhea.  He is also reported shortness of breath.  He has no known history of chronic lung disease. I personally reviewed the chest x-ray and there is no acute findings.  Labs are pending at this time, the patient may require CT imaging to evaluate for PE [DW]  0054 Patient reports feeling improved.  Denies any chest pain.  He reports his shortness of breath is improved  [DW]  0054 Glucose(!!): 505 Hyperglycemia and dehydration noted [DW]  0105 Lactic Acid, Venous(!!):  3.2 Will call code sepsis due to elevated lactate.  Continue IV fluids and will add on IV antibiotic [DW]  0109 Anion gap noted though likely due to elevated lactate and dehydration rather than actual DKA [DW]  0300 Patient is improving.  Heart rate is improving.  He denies any chest or abdominal  pain.  I have updated his wife via the phone.  Patient will be admitted [DW]    Clinical Course User Index [DW] Ripley Fraise, MD                           Medical Decision Making  This patient presents to the ED for concern of vomiting, diarrhea, COVID-19, shortness of breath this involves an extensive number of treatment options, and is a complaint that carries with it a high risk of complications and morbidity.  The differential diagnosis includes COVID-pneumonia, pulmonary embolism, acute coronary syndrome, pneumonia, CHF  Comorbidities that complicate the patient evaluation: Patients presentation is complicated by their history of CAD and diabetes    Additional history obtained: Additional history obtained from spouse Records reviewed previous admission documents  Lab Tests: I Ordered, and personally interpreted labs.  The pertinent results include: Hyperglycemia, dehydration, acute kidney injury  Imaging Studies ordered: I ordered imaging studies including X-ray chest I independently visualized and interpreted imaging which showed no acute findings I agree with the radiologist interpretation  Cardiac Monitoring: The patient was maintained on a cardiac monitor.  I personally viewed and interpreted the cardiac monitor which showed an underlying rhythm of:  sinus tachycardia  Medicines ordered and prescription drug management: I ordered medication including IV fluids, IV antibiotics for presumed sepsis Reevaluation of the patient after these medicines showed that the patient    improved   Critical Interventions:       IV fluids and antibiotics  Consultations Obtained: I  requested consultation with the admitting physician Dr. Myna Hidalgo, and discussed  findings as well as pertinent plan - they recommend: Admission  Reevaluation: After the interventions noted above, I reevaluated the patient and found that they have :improved  Complexity of problems addressed: Patients presentation is most consistent with  acute presentation with potential threat to life or bodily function      Disposition: After consideration of the diagnostic results and the patients response to treatment,  I feel that the patent would benefit from admission patient is still present with vomiting diarrhea and shortness of breath and diffuse chest abdominal pain.  After IV fluids he began to improve denies any chest or abdominal pain at this time.  His vitals are improving.  Patient was checked on multiple times.  I suspect patient became significantly dehydrated due to the vomiting and diarrhea and COVID-19.  Patient did have a mild anion gap but likely due to dehydration and elevated lactic acid.  This will likely continue to improve with IV fluids..          Final Clinical Impression(s) / ED Diagnoses Final diagnoses:  Hyperglycemia  Dehydration  AKI (acute kidney injury) ([place])  COVID-19    Rx / [place] Orders ED Discharge Orders     None         Ripley Fraise, MD 06/10/21 [phone]

## 2021-06-10 NOTE — Progress Notes (Addendum)
Inpatient Diabetes Program Recommendations  AACE/ADA: New Consensus Statement on Inpatient Glycemic Control (2015)  Target Ranges:  Prepandial:   less than 140 mg/dL      Peak postprandial:   less than 180 mg/dL (1-2 hours)      Critically ill patients:  140 - 180 mg/dL   Lab Results  Component Value Date   GLUCAP 164 (H) 06/10/2021   HGBA1C 14.2 (H) 06/10/2021    Review of Glycemic Control  Diabetes history: DM 2 Outpatient Diabetes medications: not taking prescribed Farxiga and Metformin  Current orders for Inpatient glycemic control:  IV insulin/Endotool/DKA  A1c 14.2% on 1/23  Inpatient Diabetes Program Recommendations:    At time of transition consider: -  Semglee 30 units -  Novolog 0-15 units tid + hs  PCP: Carolann Littler  Will see pt.  Note pt would benefit from restarting [place] if not having side effects from it.  -  Will benefit from GLP/GLP or GIP outpatient for weight and A1c control.  D/c supplies needed: Insulin pen order # [id number] Glucose meter kit order # [id number]  Addendum 1:29 pm: Spoke with pt at bedside regarding A1c level. Pt very tearful during conversation.  Pt reports he was never officially diagnosed with diabetes, He was told that his levels were a little elevated to take, metformin and farxiga, and his PCP would recheck his numbers. Pt has not been to his PCP for a few months and has not taken the metformin or [place] in [place]. Pt reports he also would forget to take it. Pt does not have a meter at home. Spoke with pt about A1c and glucose goals. Discussed CBG monitoring how and when to check a blood sugar. Discussed lifestyle changes with diet and exercise. Showed pt the insulin pen, how to operate and inject.    Thanks,  Tama Headings RN, MSN, BC-ADM Inpatient Diabetes Coordinator Team Pager [phone] (8a-5p)

## 2021-06-11 ENCOUNTER — Inpatient Hospital Stay (HOSPITAL_COMMUNITY): Payer: BC Managed Care – PPO

## 2021-06-11 LAB — COMPREHENSIVE METABOLIC PANEL
ALT: 31 U/L (ref 0–44)
AST: 20 U/L (ref 15–41)
Albumin: 2.5 g/dL — ABNORMAL LOW (ref 3.5–5.0)
Alkaline Phosphatase: 71 U/L (ref 38–126)
Anion gap: 7 (ref 5–15)
BUN: 11 mg/dL (ref 6–20)
CO2: 19 mmol/L — ABNORMAL LOW (ref 22–32)
Calcium: 8.2 mg/dL — ABNORMAL LOW (ref 8.9–10.3)
Chloride: 106 mmol/L (ref 98–111)
Creatinine, Ser: 1.14 mg/dL (ref 0.61–1.24)
GFR, Estimated: >60 mL/min (ref >60)
Glucose, Bld: 215 mg/dL — ABNORMAL HIGH (ref 70–99)
Potassium: 3.8 mmol/L (ref 3.5–5.1)
Sodium: 132 mmol/L — ABNORMAL LOW (ref 135–145)
Total Bilirubin: 0.4 mg/dL (ref 0.3–1.2)
Total Protein: 5.6 g/dL — ABNORMAL LOW (ref 6.5–8.1)

## 2021-06-11 LAB — CBC WITH DIFFERENTIAL/PLATELET
Abs Immature Granulocytes: 0.04 10*3/uL (ref 0.00–0.07)
Basophils Absolute: 0 10*3/uL (ref 0.0–0.1)
Basophils Relative: 0 %
Eosinophils Absolute: 0.1 10*3/uL (ref 0.0–0.5)
Eosinophils Relative: 1 %
HCT: 37 % — ABNORMAL LOW (ref 39.0–52.0)
Hemoglobin: 13.1 g/dL (ref 13.0–17.0)
Immature Granulocytes: 1 %
Lymphocytes Relative: 45 %
Lymphs Abs: 2.9 10*3/uL (ref 0.7–4.0)
MCH: 31.2 pg (ref 26.0–34.0)
MCHC: 35.4 g/dL (ref 30.0–36.0)
MCV: 88.1 fL (ref 80.0–100.0)
Monocytes Absolute: 0.6 10*3/uL (ref 0.1–1.0)
Monocytes Relative: 9 %
Neutro Abs: 2.8 10*3/uL (ref 1.7–7.7)
Neutrophils Relative %: 44 %
Platelets: 280 10*3/uL (ref 150–400)
RBC: 4.2 MIL/uL — ABNORMAL LOW (ref 4.22–5.81)
RDW: 12 % (ref 11.5–15.5)
WBC: 6.4 10*3/uL (ref 4.0–10.5)
nRBC: 0 % (ref 0.0–0.2)

## 2021-06-11 LAB — GLUCOSE, CAPILLARY
Glucose-Capillary: 235 mg/dL — ABNORMAL HIGH (ref 70–99)
Glucose-Capillary: 286 mg/dL — ABNORMAL HIGH (ref 70–99)
Glucose-Capillary: 190 mg/dL — ABNORMAL HIGH (ref 70–99)
Glucose-Capillary: 257 mg/dL — ABNORMAL HIGH (ref 70–99)

## 2021-06-11 LAB — C-REACTIVE PROTEIN: CRP: 1.5 mg/dL — ABNORMAL HIGH (ref <1.0)

## 2021-06-11 LAB — D-DIMER, QUANTITATIVE: D-Dimer, Quant: 0.28 ug/mL-FEU (ref 0.00–0.50)

## 2021-06-11 MED ORDER — AMLODIPINE BESYLATE 10 MG PO TABS
10.0000 mg | ORAL_TABLET | Freq: Every day | ORAL | Status: DC
  Administered 2021-06-11 – 2021-06-12 (×2): 10 mg via ORAL
  Filled 2021-06-11 (×2): qty 1

## 2021-06-11 MED ORDER — MORPHINE SULFATE (PF) 2 MG/ML IV SOLN
1.0000 mg | INTRAVENOUS | Status: DC | PRN
  Administered 2021-06-11: 2 mg via INTRAVENOUS
  Filled 2021-06-11 (×2): qty 1

## 2021-06-11 MED ORDER — IOHEXOL 9 MG/ML PO SOLN
ORAL | Status: AC
  Administered 2021-06-11: 500 mL
  Filled 2021-06-11: qty 1000

## 2021-06-11 MED ORDER — INSULIN GLARGINE-YFGN 100 UNIT/ML ~~LOC~~ SOLN
28.0000 [IU] | SUBCUTANEOUS | Status: DC
  Administered 2021-06-11 – 2021-06-12 (×2): 28 [IU] via SUBCUTANEOUS
  Filled 2021-06-11 (×2): qty 0.28

## 2021-06-11 MED ORDER — IOHEXOL 300 MG/ML  SOLN
100.0000 mL | Freq: Once | INTRAMUSCULAR | Status: AC | PRN
  Administered 2021-06-11: 100 mL via INTRAVENOUS

## 2021-06-11 MED ORDER — OXYCODONE-ACETAMINOPHEN 5-325 MG PO TABS
1.0000 | ORAL_TABLET | ORAL | Status: DC | PRN
  Administered 2021-06-11: 2 via ORAL
  Filled 2021-06-11: qty 2

## 2021-06-11 NOTE — Progress Notes (Signed)
PROGRESS NOTE    Kirk Barker  [id number] DOB: 07-18-1975 DOA: 06/09/2021 PCP: Kristian Covey, MD   Chief Complaint  Patient presents with   Tachycardia    Hyperglycemia    Hyperglycemia    Brief Narrative:  Kirk Barker is a pleasant 46 y.o. male with medical history significant for type 2 diabetes mellitus, CAD, ischemic cardiomyopathy, hypertension, and GERD, now presenting to the emergency department with aches, malaise, fatigue, nausea, vomiting, and abdominal discomfort.  Patient reports that he developed general aches and malaise couple days ago, tested positive for COVID-19, and has gone on to develop generalized abdominal discomfort with nausea, and nonbloody vomiting.  He was found to be in DKA.  He was started on IV insulin and transitioned to semglee and SSI.   Assessment & Plan:   Principal Problem:   DKA (diabetic ketoacidosis) (HCC) Active Problems:   Essential hypertension   Coronary artery disease involving native coronary artery of native heart without angina pectoris   Ischemic cardiomyopathy   AKI (acute kidney injury) (HCC)   COVID-19 virus infection   DKA  Resolved.  Hemoglobin A1c is 14.  Started him on Semglee and SSI.  Increase the Semglee to 28 units daily.     Hypertension:  Not well controlled.  On coreg and norvasc added.    Hyponatremia Possibly from hyperglycemia.  Pt asymptomatic.       AKI; resolved.    COVID 19 Infection: Complete the course of remdesivir.  On RA.  Follow inflammatory markers.    H/o median arcuate ligament syndrome: ? Chronic abdomninal pain. Pt is requesting for IV pain medications.  CT abd and pelvis is unremarkable.  Pt had extensive work up done in the past with multiple endoscopies and scans and was referred to vascular physician recommended referral to tertiary center for further work up .         DVT prophylaxis: (Lovenox) Code Status: full code.  Family Communication: none  atbedside Disposition:   Status is: Inpatient  Remains inpatient appropriate because: abd pain.        Consultants:  None.   Procedures: CT abd pelvis.   Antimicrobials: (none.    Subjective: Abd pain, intermittent not related to food .   Objective: Vitals:   06/10/21 2018 06/10/21 2353 06/11/21 0419 06/11/21 0805  BP: (!) 148/99 132/83 133/81 (!) 142/107  Pulse: 100 96 90 91  Resp: 19 15 17 18   Temp: 98.7 F (37.1 C) 98.6 F (37 C) 98.8 F (37.1 C) 98.5 F (36.9 C)  TempSrc: Oral Oral Oral Oral  SpO2: 99% 98% 99% 99%  Weight:      Height:        Intake/Output Summary (Last 24 hours) at 06/11/2021 1055 Last data filed at 06/11/2021 0806 Gross per 24 hour  Intake 480 ml  Output 800 ml  Net -320 ml   Filed Weights   06/10/21 0135  Weight: 104.3 kg    Examination:  General exam: Appears calm and comfortable  Respiratory system: Clear to auscultation. Respiratory effort normal. Cardiovascular system: S1 & S2 heard, RRR. No JVD, murmurs, rubs, gallops or clicks. No pedal edema. Gastrointestinal system: Abdomen is nondistended, soft and nontender. No organomegaly or masses felt. Normal bowel sounds heard. Central nervous system: Alert and oriented. No focal neurological deficits. Extremities: Symmetric 5 x 5 power. Skin: No rashes, lesions or ulcers Psychiatry: Judgement and insight appear normal. Mood & affect appropriate.     Data Reviewed: I  have personally reviewed following labs and imaging studies  CBC: Recent Labs  Lab 06/10/21 0010 06/10/21 0036 06/11/21 0145  WBC 8.7  --  6.4  NEUTROABS 6.5  --  2.8  HGB 18.2* 18.7*   19.4* 13.1  HCT 50.5 55.0*   57.0* 37.0*  MCV 87.1  --  88.1  PLT 359  --  280    Basic Metabolic Panel: Recent Labs  Lab 06/10/21 0640 06/10/21 1039 06/10/21 1401 06/10/21 1809 06/10/21 2144 06/11/21 0145  NA 129* 135 136 132* 131* 132*  K 4.3 4.1 3.9 3.9 4.0 3.8  CL 104 107 105 107 105 106  CO2 19* 19* 20*  18* 17* 19*  GLUCOSE 372* 170* 129* 163* 214* 215*  BUN 15 12 11 11 11 11   CREATININE 1.20 1.21 1.32* 1.03 1.15 1.14  CALCIUM 7.8* 8.3* 8.5* 8.2* 8.3* 8.2*  MG 1.7  --   --   --   --   --   PHOS 2.9  --   --   --   --   --     GFR: Estimated Creatinine Clearance: 100 mL/min (by C-G formula based on SCr of 1.14 mg/dL).  Liver Function Tests: Recent Labs  Lab 06/10/21 0010 06/11/21 0145  AST 26 20  ALT 50* 31  ALKPHOS 124 71  BILITOT 1.2 0.4  PROT 9.2* 5.6*  ALBUMIN 3.9 2.5*    CBG: Recent Labs  Lab 06/10/21 1135 06/10/21 1330 06/10/21 1549 06/10/21 2136 06/11/21 0627  GLUCAP 150* 154* 121* 198* 235*     Recent Results (from the past 240 hour(s))  Resp Panel by RT-PCR (Flu A&B, Covid)     Status: Abnormal   Collection Time: 06/09/21 11:31 PM   Specimen: Nasopharyngeal(NP) swabs in vial transport medium  Result Value Ref Range Status   SARS Coronavirus 2 by RT PCR POSITIVE (A) NEGATIVE Final    Comment: (NOTE) SARS-CoV-2 target nucleic acids are DETECTED.  The SARS-CoV-2 RNA is generally detectable in upper respiratory specimens during the acute phase of infection. Positive results are indicative of the presence of the identified virus, but do not rule out bacterial infection or co-infection with other pathogens not detected by the test. Clinical correlation with patient history and other diagnostic information is necessary to determine patient infection status. The expected result is Negative.  Fact Sheet for Patients: 08/07/21  Fact Sheet for Healthcare Providers: BloggerCourse.com  This test is not yet approved or cleared by the [employer].[place] FDA and  has been authorized for detection and/or diagnosis of SARS-CoV-2 by FDA under an Emergency Use Authorization (EUA).  This EUA will remain in effect (meaning this test can be used) for the duration of  the COVID-19 declaration under Section 564(b)(1)  of the A ct, 21 U.S.C. section 360bbb-3(b)(1), unless the authorization is terminated or revoked sooner.     Influenza A by PCR NEGATIVE NEGATIVE Final   Influenza B by PCR NEGATIVE NEGATIVE Final    Comment: (NOTE) The Xpert Xpress SARS-CoV-2/FLU/RSV plus assay is intended as an aid in the diagnosis of influenza from Nasopharyngeal swab specimens and should not be used as a sole basis for treatment. Nasal washings and aspirates are unacceptable for Xpert Xpress SARS-CoV-2/FLU/RSV testing.  Fact Sheet for Patients: [place]  Fact Sheet for Healthcare Providers: BloggerCourse.com  This test is not yet approved or cleared by the Serious[employer]ker.it FDA and has been authorized for detection and/or diagnosis of SARS-CoV-2 by FDA under an Emergency Use Authorization (  EUA). This EUA will remain in effect (meaning this test can be used) for the duration of the COVID-19 declaration under Section 564(b)(1) of the Act, 21 U.S.C. section 360bbb-3(b)(1), unless the authorization is terminated or revoked.  Performed at [facility], [address] [place], [place] [address]          Radiology Studies: DG Chest Port 1 View  Result Date: 06/09/2021 CLINICAL DATA:  Possible sepsis EXAM: PORTABLE CHEST 1 VIEW COMPARISON:  09/13/2019 FINDINGS: The heart size and mediastinal contours are within normal limits. Both lungs are clear. The visualized skeletal structures are unremarkable. IMPRESSION: No active disease. Electronically Signed   By: Jasmine PangKim  Fujinaga M.D.   On: 06/09/2021 23:54        Scheduled Meds:  aspirin  81 mg Oral Daily   atorvastatin  80 mg Oral q1800   carvedilol  25 mg Oral BID WC   enoxaparin (LOVENOX) injection  40 mg Subcutaneous Daily   insulin aspart  0-5 Units Subcutaneous QHS   insulin aspart  0-9 Units Subcutaneous TID WC   insulin glargine-yfgn  28 Units Subcutaneous Q24H   pantoprazole  40 mg Oral  Daily   Continuous Infusions:  remdesivir 100 mg in NS 100 mL 100 mg (06/11/21 0834)     LOS: 1 day        Kathlen ModyVijaya Khori Underberg, MD [facility]   To contact the attending provider between 7A-7P or the covering provider during after hours 7P-7A, please log into the web site [email or web address] and access using universal [place] password for that web site. If you do not have the password, please call the hospital operator.  06/11/2021, 10:55 AM

## 2021-06-11 NOTE — Progress Notes (Signed)
Performed EKG and placed in pt's chart.   Dawnmarie Breon S Ebert Forrester, RN  

## 2021-06-11 NOTE — Progress Notes (Signed)
°  Transition of Care [facility]) Screening Note   Patient Details  Name: Kirk Barker Date of Birth: 05-19-1976   [facility]) CM/SW Contact:    Bary Castilla, LCSW Phone Number: 06/11/2021, 3:28 PM    Transition of Care Department [facility]) has reviewed patient and no TOC needs have been identified at this time. We will continue to monitor patient advancement through interdisciplinary progression rounds. If new patient transition needs arise, please place a TOC consult.

## 2021-06-11 NOTE — Progress Notes (Signed)
Pt's bp = 173/94. Given hydralize 25 mg PRN. Will continue to monitor the pt.   Lawson Radar, RN

## 2021-06-12 LAB — CBC WITH DIFFERENTIAL/PLATELET
Abs Immature Granulocytes: 0.06 10*3/uL (ref 0.00–0.07)
Basophils Absolute: 0 10*3/uL (ref 0.0–0.1)
Basophils Relative: 0 %
Eosinophils Absolute: 0.1 10*3/uL (ref 0.0–0.5)
Eosinophils Relative: 1 %
HCT: 37.9 % — ABNORMAL LOW (ref 39.0–52.0)
Hemoglobin: 13.1 g/dL (ref 13.0–17.0)
Immature Granulocytes: 1 %
Lymphocytes Relative: 34 %
Lymphs Abs: 2.5 10*3/uL (ref 0.7–4.0)
MCH: 30.6 pg (ref 26.0–34.0)
MCHC: 34.6 g/dL (ref 30.0–36.0)
MCV: 88.6 fL (ref 80.0–100.0)
Monocytes Absolute: 0.8 10*3/uL (ref 0.1–1.0)
Monocytes Relative: 10 %
Neutro Abs: 3.9 10*3/uL (ref 1.7–7.7)
Neutrophils Relative %: 54 %
Platelets: 316 10*3/uL (ref 150–400)
RBC: 4.28 MIL/uL (ref 4.22–5.81)
RDW: 11.9 % (ref 11.5–15.5)
WBC: 7.3 10*3/uL (ref 4.0–10.5)
nRBC: 0 % (ref 0.0–0.2)

## 2021-06-12 LAB — COMPREHENSIVE METABOLIC PANEL
ALT: 51 U/L — ABNORMAL HIGH (ref 0–44)
AST: 30 U/L (ref 15–41)
Albumin: 2.8 g/dL — ABNORMAL LOW (ref 3.5–5.0)
Alkaline Phosphatase: 93 U/L (ref 38–126)
Anion gap: 9 (ref 5–15)
BUN: 9 mg/dL (ref 6–20)
CO2: 19 mmol/L — ABNORMAL LOW (ref 22–32)
Calcium: 8.3 mg/dL — ABNORMAL LOW (ref 8.9–10.3)
Chloride: 103 mmol/L (ref 98–111)
Creatinine, Ser: 1.18 mg/dL (ref 0.61–1.24)
GFR, Estimated: >60 mL/min (ref >60)
Glucose, Bld: 306 mg/dL — ABNORMAL HIGH (ref 70–99)
Potassium: 3.9 mmol/L (ref 3.5–5.1)
Sodium: 131 mmol/L — ABNORMAL LOW (ref 135–145)
Total Bilirubin: 0.4 mg/dL (ref 0.3–1.2)
Total Protein: 6 g/dL — ABNORMAL LOW (ref 6.5–8.1)

## 2021-06-12 LAB — GLUCOSE, CAPILLARY
Glucose-Capillary: 265 mg/dL — ABNORMAL HIGH (ref 70–99)
Glucose-Capillary: 263 mg/dL — ABNORMAL HIGH (ref 70–99)

## 2021-06-12 LAB — D-DIMER, QUANTITATIVE: D-Dimer, Quant: <0.27 ug/mL-FEU (ref 0.00–0.50)

## 2021-06-12 LAB — C-REACTIVE PROTEIN: CRP: 0.7 mg/dL (ref <1.0)

## 2021-06-12 MED ORDER — METFORMIN HCL 500 MG PO TABS: 1000.0000 mg | ORAL_TABLET | Freq: Two times a day (BID) | ORAL | 3 refills | Status: DC

## 2021-06-12 MED ORDER — INSULIN GLARGINE-YFGN 100 UNIT/ML ~~LOC~~ SOLN: 30.0000 [IU] | SUBCUTANEOUS | 11 refills | Status: DC

## 2021-06-12 MED ORDER — DAPAGLIFLOZIN PROPANEDIOL 10 MG PO TABS: 10.0000 mg | ORAL_TABLET | Freq: Every day | ORAL | 3 refills | Status: DC

## 2021-06-12 MED ORDER — CARVEDILOL 25 MG PO TABS: 25.0000 mg | ORAL_TABLET | Freq: Two times a day (BID) | ORAL | 3 refills | Status: DC

## 2021-06-12 MED ORDER — NITROGLYCERIN 0.4 MG SL SUBL: 0.4000 mg | SUBLINGUAL_TABLET | SUBLINGUAL | 1 refills | Status: AC | PRN

## 2021-06-12 MED ORDER — LOSARTAN POTASSIUM 50 MG PO TABS: 50.0000 mg | ORAL_TABLET | Freq: Every day | ORAL | 3 refills | Status: DC

## 2021-06-12 MED ORDER — ASPIRIN 81 MG PO CHEW: 81.0000 mg | CHEWABLE_TABLET | Freq: Every day | ORAL | 1 refills | Status: DC

## 2021-06-12 MED ORDER — PANTOPRAZOLE SODIUM 40 MG PO TBEC: 40.0000 mg | DELAYED_RELEASE_TABLET | Freq: Every day | ORAL | 3 refills | Status: DC

## 2021-06-12 MED ORDER — AMLODIPINE BESYLATE 10 MG PO TABS: 10.0000 mg | ORAL_TABLET | Freq: Every day | ORAL | 2 refills | Status: DC

## 2021-06-12 MED ORDER — GUAIFENESIN-DM 100-10 MG/5ML PO SYRP: 10.0000 mL | ORAL_SOLUTION | ORAL | 0 refills | Status: DC | PRN

## 2021-06-12 MED ORDER — INSULIN ASPART 100 UNIT/ML IJ SOLN: INTRAMUSCULAR | 11 refills | Status: DC

## 2021-06-12 NOTE — Progress Notes (Signed)
D/c tele and Ivs. Went over AVS with pt and all questions were addressed.  Talisa Petrak S Melysa Schroyer, RN  

## 2021-06-13 ENCOUNTER — Telehealth: Payer: Self-pay

## 2021-06-13 LAB — GLUCOSE, CAPILLARY: Glucose-Capillary: 136 mg/dL — ABNORMAL HIGH (ref 70–99)

## 2021-06-13 NOTE — Telephone Encounter (Signed)
Transition Care Management Follow-up Telephone Call Date of discharge and from where: [facility] 06-12-21 Dx: diabetic ketoacidosis How have you been since you were released from the [facility]? Feeling better  Any questions or concerns? No  Items Reviewed: Did the pt receive and understand the discharge instructions provided? Yes  Medications obtained and verified? Yes  Other? No  Any new allergies since your discharge? No  Dietary orders reviewed? Yes Do you have support at home? Yes   Home Care and Equipment/Supplies: Were home health services ordered? no If so, what is the name of the agency? na  Has the agency set up a time to come to the patient's home? not applicable Were any new equipment or medical supplies ordered?  No What is the name of the medical supply agency? na Were you able to get the supplies/equipment? not applicable Do you have any questions related to the use of the equipment or supplies? No  Functional Questionnaire: (I = Independent and D = Dependent) ADLs: I  Bathing/Dressing- I  Meal Prep- I  Eating- I  Maintaining continence- I  Transferring/Ambulation- I  Managing Meds- I  Follow up appointments reviewed:  PCP [facility] f/u appt confirmed? Yes  Scheduled to see Dr Kirk [facility] on 06-15-21 @ 130pm. Specialist [facility] f/u appt confirmed? No  . Are transportation arrangements needed? No  If their condition worsens, is the pt aware to call PCP or go to the Emergency Dept.? Yes Was the patient provided with contact information for the PCP's office or ED? Yes Was to pt encouraged to call back with questions or concerns? Yes

## 2021-06-15 ENCOUNTER — Ambulatory Visit (INDEPENDENT_AMBULATORY_CARE_PROVIDER_SITE_OTHER): Payer: Medicaid Other | Admitting: Family Medicine

## 2021-06-15 ENCOUNTER — Ambulatory Visit: Payer: Medicaid Other | Admitting: Family Medicine

## 2021-06-15 VITALS — BP 140/90 | HR 138 | Temp 99.7°F | Wt 228.0 lb

## 2021-06-15 DIAGNOSIS — Z9114 Patient's other noncompliance with medication regimen: Secondary | ICD-10-CM

## 2021-06-15 DIAGNOSIS — R110 Nausea: Secondary | ICD-10-CM | POA: Diagnosis not present

## 2021-06-15 DIAGNOSIS — E1110 Type 2 diabetes mellitus with ketoacidosis without coma: Secondary | ICD-10-CM | POA: Diagnosis not present

## 2021-06-15 DIAGNOSIS — Z8616 Personal history of COVID-19: Secondary | ICD-10-CM

## 2021-06-15 DIAGNOSIS — U071 COVID-19: Secondary | ICD-10-CM

## 2021-06-15 DIAGNOSIS — E119 Type 2 diabetes mellitus without complications: Secondary | ICD-10-CM

## 2021-06-15 LAB — CULTURE, BLOOD (ROUTINE X 2)
Culture: NO GROWTH
Culture: NO GROWTH
Special Requests: ADEQUATE

## 2021-06-15 LAB — GLUCOSE, POCT (MANUAL RESULT ENTRY): POC Glucose: 290 mg/dl — AB (ref 70–99)

## 2021-06-15 MED ORDER — ONDANSETRON HCL 4 MG/2ML IJ SOLN
4.0000 mg | INTRAMUSCULAR | Status: AC
  Administered 2021-06-15 (×2): 4 mg via INTRAMUSCULAR

## 2021-06-15 MED ORDER — ONDANSETRON 8 MG PO TBDP: 8.0000 mg | ORAL_TABLET | Freq: Three times a day (TID) | ORAL | 0 refills | Status: DC | PRN

## 2021-06-15 NOTE — Progress Notes (Signed)
Established Patient Office Visit  Subjective:  [name], male    DOB: 1975-06-23  Age: 46 y.o. MRN: [id number]  CC:  Chief Complaint  Patient presents with   Hospitalization Follow-up    HPI Kirk [facility] presents for hospital follow-up.  He was unfortunately placed into a 15-minute slot in a timeframe when our lab was already closed.  He had recent hospitalization for positive COVID infection along with ketoacidosis.  He has history of type 2 diabetes, CAD, hypertension, GERD.  Presented with some abdominal discomfort, nausea, vomiting, fatigue, body aches.  History of poor compliance with follow-up.  Not seen here in over a year.  He had not been taking any of his diabetes medications at the time of admission.  He had good O2 sats.  Did have some slight tachypnea.  Sinus tachycardia.  Chest x-ray showed no acute disease.  Glucose initially 515.  Lactic acid was elevated 3.2.  Urinalysis showed ketonuria.  Blood cultures negative.  Patient was given IV fluids and broad-spectrum antibiotics. COVID testing positive.  Influenza screen negative.  His last A1c back in 2021 of been 10.3 with poor compliance with not taking any diabetes medications at the time of admission.  Patient apparently was discharged home on metformin and [place].  He was given NovoLog sliding scale insulin and basal insulin but not taking either 1 of these medications.  He states he is taking the oral medications.  He was feeling better and apparently just earlier today after eating some clam chowder for lunch started having some nausea and had episode of vomiting here in the office.  No dysuria.  He plans to get home glucose monitoring later today.  A1c during hospitalization 14.2.  Denies any chest pain at this time.  No abdominal pain.  One diarrhea stool earlier today.  Past Medical History:  Diagnosis Date   Median arcuate ligament syndrome [facility])     Past Surgical History:  Procedure  Laterality Date   CHOLECYSTECTOMY     LEFT HEART CATH AND CORONARY ANGIOGRAPHY N/A 09/15/2019   Procedure: LEFT HEART CATH AND CORONARY ANGIOGRAPHY;  Surgeon: Lyn Records, MD;  Location: [facility] IN[facility] LAB;  Service: Cardiovascular;  Laterality: N/A;    Family History  Problem Relation Age of Onset   Stroke Mother    Diabetes Father    Brain cancer Maternal Grandmother     Social History   Socioeconomic History   Marital status: Married    Spouse name: Not on file   Number of children: Not on file   Years of education: Not on file   Highest education level: Not on file  Occupational History    Employer: [employer]  Tobacco Use   Smoking status: Former    Packs/day: 0.50    Types: Cigarettes    Quit date: 09/16/2019    Years since quitting: 1.7   Smokeless tobacco: Never  Vaping Use   Vaping Use: Never used  Substance and Sexual Activity   Alcohol use: Yes    Comment: social    Drug use: No   Sexual activity: Yes  Other Topics Concern   Not on file  Social History Narrative   Not on file   Social Determinants of Health   Financial Resource Strain: Not on file  Food Insecurity: Not on file  Transportation Needs: Not on file  Physical Activity: Not on file  Stress: Not on file  Social Connections: Not on file  Intimate Partner Violence: Not on file    Outpatient Medications Prior to Visit  Medication Sig Dispense Refill   amLODipine (NORVASC) 10 MG tablet Take 1 tablet (10 mg total) by mouth daily. 90 tablet 2   aspirin 81 MG chewable tablet Chew 1 tablet (81 mg total) by mouth daily. 30 tablet 1   atorvastatin (LIPITOR) 80 MG tablet TAKE 1 TABLET (80 MG TOTAL) BY MOUTH DAILY AT 6 PM. 90 tablet 3   carvedilol (COREG) 25 MG tablet Take 1 tablet (25 mg total) by mouth 2 (two) times daily with a meal. 180 tablet 3   dapagliflozin propanediol (FARXIGA) 10 MG TABS tablet Take 1 tablet (10 mg total) by mouth daily before breakfast. 90 tablet 3    guaiFENesin-dextromethorphan (ROBITUSSIN DM) 100-10 MG/5ML syrup Take 10 mLs by mouth every 4 (four) hours as needed for cough. 118 mL 0   insulin aspart (NOVOLOG) 100 UNIT/ML injection CBG 70 - 120: 0 units  CBG 121 - 150: 1 unit  CBG 151 - 200: 2 units  CBG 201 - 250: 3 units  CBG 251 - 300: 5 units  CBG 301 - 350: 7 units  CBG 351 - 400: 9 units 10 mL 11   insulin glargine-yfgn (SEMGLEE) 100 UNIT/ML injection Inject 0.3 mLs (30 Units total) into the skin daily. 10 mL 11   losartan (COZAAR) 50 MG tablet Take 1 tablet (50 mg total) by mouth daily. 90 tablet 3   metFORMIN (GLUCOPHAGE) 500 MG tablet Take 2 tablets (1,000 mg total) by mouth 2 (two) times daily. 360 tablet 3   nitroGLYCERIN (NITROSTAT) 0.4 MG SL tablet Place 1 tablet (0.4 mg total) under the tongue every 5 (five) minutes as needed for chest pain. 25 tablet 1   pantoprazole (PROTONIX) 40 MG tablet Take 1 tablet (40 mg total) by mouth daily. 90 tablet 3   No facility-administered medications prior to visit.    Allergies  Allergen Reactions   Bee Venom    Cat Hair Extract Anxiety, Itching and Other (See Comments)    Itching , watering eyes  Itching , watering eyes  Itching , watering eyes  Itching , watering eyes     Other Itching    ROS Review of Systems  Constitutional:  Negative for fever.  Respiratory:  Negative for cough and shortness of breath.   Cardiovascular:  Negative for chest pain.  Gastrointestinal:  Positive for nausea and vomiting. Negative for abdominal pain and blood in stool.  Genitourinary:  Negative for dysuria.  Neurological:  Negative for dizziness.  Psychiatric/Behavioral:  Negative for confusion.      Objective:    Physical Exam Vitals reviewed.  Cardiovascular:     Comments: Mildly tachycardic with heart rate around 105 but he had episode of vomiting on arrival. Pulmonary:     Effort: Pulmonary effort is normal.     Breath sounds: Normal breath sounds.  Abdominal:     Palpations:  Abdomen is soft. There is no mass.     Tenderness: There is no abdominal tenderness. There is no guarding or rebound.  Musculoskeletal:     Cervical back: Neck supple.    BP 140/90 (BP Location: Left Arm, Patient Position: Sitting, Cuff Size: Normal)    Pulse (!) 138    Temp 99.7 F (37.6 C) (Oral)    Wt 228 lb (103.4 kg)    SpO2 98%    BMI 30.92 kg/m  Wt Readings from Last 3 Encounters:  06/15/21 228 lb (  103.4 kg)  06/10/21 230 lb (104.3 kg)  04/27/20 230 lb (104.3 kg)     Health Maintenance Due  Topic Date Due   COVID-19 Vaccine (1) Never done   FOOT EXAM  Never done   Hepatitis C Screening  Never done   TETANUS/TDAP  Never done   COLONOSCOPY (Pts 45-88yrs Insurance coverage will need to be confirmed)  Never done   Pneumococcal Vaccine 55-51 Years old (2 - PCV) 12/23/2020   OPHTHALMOLOGY EXAM  12/24/2020   INFLUENZA VACCINE  Never done    There are no preventive care reminders to display for this patient.  Lab Results  Component Value Date   TSH 0.847 09/13/2019   Lab Results  Component Value Date   WBC 7.3 06/12/2021   HGB 13.1 06/12/2021   HCT 37.9 (L) 06/12/2021   MCV 88.6 06/12/2021   PLT 316 06/12/2021   Lab Results  Component Value Date   NA 131 (L) 06/12/2021   K 3.9 06/12/2021   CO2 19 (L) 06/12/2021   GLUCOSE 306 (H) 06/12/2021   BUN 9 06/12/2021   CREATININE 1.18 06/12/2021   BILITOT 0.4 06/12/2021   ALKPHOS 93 06/12/2021   AST 30 06/12/2021   ALT 51 (H) 06/12/2021   PROT 6.0 (L) 06/12/2021   ALBUMIN 2.8 (L) 06/12/2021   CALCIUM 8.3 (L) 06/12/2021   ANIONGAP 9 06/12/2021   Lab Results  Component Value Date   CHOL 84 (L) 12/09/2019   Lab Results  Component Value Date   HDL 29 (L) 12/09/2019   Lab Results  Component Value Date   LDLCALC 30 12/09/2019   Lab Results  Component Value Date   TRIG 144 12/09/2019   Lab Results  Component Value Date   CHOLHDL 2.9 12/09/2019   Lab Results  Component Value Date   HGBA1C 14.2 (H)  06/10/2021      Assessment & Plan:   #1 type 2 diabetes with recent diabetic ketoacidosis.  Had recent COVID infection which likely exacerbated already poor control.  Very poor compliance with medications.  Was not taking any meds prior to admission and no recent follow up..  -He needs follow-up labs but we were unable to as our lab had closed. -We did obtain fingerstick CBG which was 290 -We had planned to get a urine dipstick to look for ketonuria but he had already gone to the bathroom and unable to provide sample -We strongly advise for him to get home glucose monitor and start monitoring several times daily and record and bring back for review in 1 week.  His wife is reportedly picking up monitor today   -Also strongly advocated that he start sliding scale NovoLog which he was given at discharge.  He has not yet filled that. - #2 nausea and vomiting.  Patient was doing reasonably well until this afternoon after lunch.  After vomiting episode here feels much better with no nausea.  He was given Zofran 4 mg IM.  We wrote for some Zofran 8 mg ODT to take 1 every 8 hours as needed for any recurrent nausea or vomiting.  Needs urine dip sticks to test for ketones.   #3 recent Covid infection.   No significant respiratory symptoms at this time  Follow-up immediately for any recurrent vomiting.  Schedule follow up early next week and plan follow up labs then.       Meds ordered this encounter  Medications   ondansetron (ZOFRAN-ODT) 8 MG disintegrating tablet  Sig: Take 1 tablet (8 mg total) by mouth every 8 (eight) hours as needed for nausea or vomiting.    Dispense:  15 tablet    Refill:  0   ondansetron (ZOFRAN) injection 4 mg    Follow-up: No follow-ups on file.    Evelena PeatBruce Azaryah Oleksy, MD

## 2021-06-15 NOTE — Patient Instructions (Signed)
Go ahead and start the sliding scale insulin with Novolog  Monitor blood sugars several times daily and record and bring back at follow up.   Set up one week follow up.  Set up 30 MINUTE follow up visit      Continue with the Metformin and [place]

## 2021-06-19 NOTE — Discharge Summary (Signed)
Physician Discharge Summary  Kirk Barker IRW:[id number] DOB: [birth year] DOA: 06/09/2021  PCP: Kristian Covey, MD  Admit date: 06/09/2021 Discharge date: 06/12/2021  Admitted From: Home Disposition:  Home   Recommendations for Outpatient Follow-up:  Follow up with PCP in 1-2 weeks Please obtain BMP/CBC in one week Please follow up with endocrinology in 2 weeks.   Discharge Condition:Stable CODE STATUS:FULL Code Diet recommendation: Heart Healthy / Carb Modified   Brief/Interim Summary: Kirk Barker is a pleasant 46 y.o. male with medical history significant for type 2 diabetes mellitus, CAD, ischemic cardiomyopathy, hypertension, and GERD, now presenting to the emergency department with aches, malaise, fatigue, nausea, vomiting, and abdominal discomfort.  Patient reports that he developed general aches and malaise couple days ago, tested positive for COVID-19, and has gone on to develop generalized abdominal discomfort with nausea, and nonbloody vomiting.  He was found to be in DKA.  He was started on IV insulin and transitioned to semglee and SSI.   Discharge Diagnoses:  Principal Problem:   DKA (diabetic ketoacidosis) (HCC) Active Problems:   Essential hypertension   Coronary artery disease involving native coronary artery of native heart without angina pectoris   Ischemic cardiomyopathy   AKI (acute kidney injury) (HCC)   COVID-19 virus infection  DKA  Resolved.  Hemoglobin A1c is 14.  Started him on Semglee and SSI.  Discharge on 30 units of Semglee and SSI.        Hypertension:  Not well controlled.  On coreg and norvasc added.      Hyponatremia Possibly from hyperglycemia.  Pt asymptomatic.            AKI; resolved.      COVID 19 Infection: Complete the course of remdesivir.  On RA.  Follow inflammatory markers.      H/o median arcuate ligament syndrome: ? Chronic abdomninal pain. Pt is requesting for IV pain medications. Suspect drug seeking  behavior.  CT abd and pelvis is unremarkable.  Pt had extensive work up done in the past with multiple endoscopies and scans and was referred to vascular physician recommended referral to tertiary center for further work up .     Discharge Instructions  Discharge Instructions     Diet - low sodium heart healthy   Complete by: As directed    Discharge instructions   Complete by: As directed    Please follow up with PCP in one week.      Allergies as of 06/12/2021       Reactions   Bee Venom    Cat Hair Extract Anxiety, Itching, Other (See Comments)   Itching , watering eyes  Itching , watering eyes  Itching , watering eyes  Itching , watering eyes    Other Itching        Medication List     STOP taking these medications    Brilinta 90 MG Tabs tablet Generic drug: ticagrelor       TAKE these medications    amLODipine 10 MG tablet Commonly known as: NORVASC Take 1 tablet (10 mg total) by mouth daily.   aspirin 81 MG chewable tablet Chew 1 tablet (81 mg total) by mouth daily.   atorvastatin 80 MG tablet Commonly known as: LIPITOR TAKE 1 TABLET (80 MG TOTAL) BY MOUTH DAILY AT 6 PM.   carvedilol 25 MG tablet Commonly known as: COREG Take 1 tablet (25 mg total) by mouth 2 (two) times daily with a meal.   dapagliflozin propanediol 10  MG Tabs tablet Commonly known as: Farxiga Take 1 tablet (10 mg total) by mouth daily before breakfast.   guaiFENesin-dextromethorphan 100-10 MG/5ML syrup Commonly known as: ROBITUSSIN DM Take 10 mLs by mouth every 4 (four) hours as needed for cough.   insulin aspart 100 UNIT/ML injection Commonly known as: novoLOG CBG 70 - 120: 0 units  CBG 121 - 150: 1 unit  CBG 151 - 200: 2 units  CBG 201 - 250: 3 units  CBG 251 - 300: 5 units  CBG 301 - 350: 7 units  CBG 351 - 400: 9 units   insulin glargine-yfgn 100 UNIT/ML injection Commonly known as: SEMGLEE Inject 0.3 mLs (30 Units total) into the skin daily.   losartan 50 MG  tablet Commonly known as: COZAAR Take 1 tablet (50 mg total) by mouth daily.   metFORMIN 500 MG tablet Commonly known as: GLUCOPHAGE Take 2 tablets (1,000 mg total) by mouth 2 (two) times daily.   nitroGLYCERIN 0.4 MG SL tablet Commonly known as: NITROSTAT Place 1 tablet (0.4 mg total) under the tongue every 5 (five) minutes as needed for chest pain.   pantoprazole 40 MG tablet Commonly known as: PROTONIX Take 1 tablet (40 mg total) by mouth daily.        Allergies  Allergen Reactions   Bee Venom    Cat Hair Extract Anxiety, Itching and Other (See Comments)    Itching , watering eyes  Itching , watering eyes  Itching , watering eyes  Itching , watering eyes     Other Itching    Consultations: None.    Procedures/Studies: CT ABDOMEN PELVIS W CONTRAST  Result Date: 06/11/2021 CLINICAL DATA:  Abdominal pain. EXAM: CT ABDOMEN AND PELVIS WITH CONTRAST TECHNIQUE: Multidetector CT imaging of the abdomen and pelvis was performed using the standard protocol following bolus administration of intravenous contrast. RADIATION DOSE REDUCTION: This exam was performed according to the departmental dose-optimization program which includes automated exposure control, adjustment of the mA and/or kV according to patient size and/or use of iterative reconstruction technique. CONTRAST:  OMNIPAQUE IOHEXOL 300 MG/ML  SOLN COMPARISON:  September 13, 2019 FINDINGS: Lower chest: 1.3 cm pulmonary nodule versus rounded atelectasis versus scarring in the right lung base, image 80/23, sequence 5, stable. Hepatobiliary: No focal liver abnormality is seen. Status post cholecystectomy. No biliary dilatation. Pancreas: Unremarkable. No pancreatic ductal dilatation or surrounding inflammatory changes. Spleen: Normal in size without focal abnormality. Adrenals/Urinary Tract: Adrenal glands are unremarkable. Kidneys are normal, without renal calculi, focal lesion, or hydronephrosis. Bladder is unremarkable.  Stomach/Bowel: Stomach is within normal limits. Appendix appears normal. No evidence of bowel wall thickening, distention, or inflammatory changes. Vascular/Lymphatic: Aortic atherosclerosis, mild. No enlarged abdominal or pelvic lymph nodes. Reproductive: Prostate is unremarkable. Other: No abdominal wall hernia or abnormality. No abdominopelvic ascites. Musculoskeletal: No acute or significant osseous findings. IMPRESSION: 1. No acute abnormalities within the abdomen or pelvis. 2. 1.3 cm pulmonary nodule versus rounded atelectasis versus scarring in the right lung base, stable. Aortic Atherosclerosis (ICD10-I70.0). Electronically Signed   By: Ted Mcalpine M.D.   On: 06/11/2021 17:56   DG Chest Port 1 View  Result Date: 06/09/2021 CLINICAL DATA:  Possible sepsis EXAM: PORTABLE CHEST 1 VIEW COMPARISON:  09/13/2019 FINDINGS: The heart size and mediastinal contours are within normal limits. Both lungs are clear. The visualized skeletal structures are unremarkable. IMPRESSION: No active disease. Electronically Signed   By: Jasmine Pang M.D.   On: 06/09/2021 23:54  Subjective: No new complaints.   Discharge Exam: Vitals:   06/12/21 0612 06/12/21 0756  BP: 140/89 (!) 141/89  Pulse: 69 81  Resp: 14 14  Temp: 98.4 F (36.9 C) 98.5 F (36.9 C)  SpO2: 100% 100%   Vitals:   06/11/21 1548 06/11/21 2058 06/12/21 0612 06/12/21 0756  BP: (!) 150/96 131/86 140/89 (!) 141/89  Pulse: (!) 198 90 69 81  Resp: 15 18 14 14   Temp: 98.2 F (36.8 C) 98.8 F (37.1 C) 98.4 F (36.9 C) 98.5 F (36.9 C)  TempSrc: Oral Oral Oral Oral  SpO2: 99% 100% 100% 100%  Weight:      Height:        General: Pt is alert, awake, not in acute distress Cardiovascular: RRR, S1/S2 +, no rubs, no gallops Respiratory: CTA bilaterally, no wheezing, no rhonchi Abdominal: Soft, NT, ND, bowel sounds + Extremities: no edema, no cyanosis    The results of significant diagnostics from this hospitalization  (including imaging, microbiology, ancillary and laboratory) are listed below for reference.     Microbiology: Recent Results (from the past 240 hour(s))  Blood Culture (routine x 2)     Status: None   Collection Time: 06/10/21 12:10 AM   Specimen: BLOOD  Result Value Ref Range Status   Specimen Description BLOOD RIGHT ANTECUBITAL  Final   Special Requests   Final    BOTTLES DRAWN AEROBIC AND ANAEROBIC Blood Culture adequate volume   Culture   Final    NO GROWTH 5 DAYS Performed at [facility], [address] [place], [place] [address]    Report Status 06/15/2021 FINAL  Final  Blood Culture (routine x 2)     Status: None   Collection Time: 06/10/21 12:24 AM   Specimen: BLOOD RIGHT HAND  Result Value Ref Range Status   Specimen Description BLOOD RIGHT HAND  Final   Special Requests   Final    BOTTLES DRAWN AEROBIC AND ANAEROBIC Blood Culture results may not be optimal due to an inadequate volume of blood received in culture bottles   Culture   Final    NO GROWTH 5 DAYS Performed at [facility], [address] [place], [place] [address]    Report Status 06/15/2021 FINAL  Final     Labs: BNP (last 3 results) Recent Labs    06/10/21 0640  BNP 20.0   Basic Metabolic Panel: No results for input(s): NA, K, CL, CO2, GLUCOSE, BUN, CREATININE, CALCIUM, MG, PHOS in the last 168 hours. Liver Function Tests: No results for input(s): AST, ALT, ALKPHOS, BILITOT, PROT, ALBUMIN in the last 168 hours. No results for input(s): LIPASE, AMYLASE in the last 168 hours. No results for input(s): AMMONIA in the last 168 hours. CBC: No results for input(s): WBC, NEUTROABS, HGB, HCT, MCV, PLT in the last 168 hours. Cardiac Enzymes: No results for input(s): CKTOTAL, CKMB, CKMBINDEX, TROPONINI in the last 168 hours. BNP: Invalid input(s): POCBNP CBG: No results for input(s): GLUCAP in the last 168 hours. D-Dimer No results for input(s): DDIMER in the last 72 hours. Hgb A1c No  results for input(s): HGBA1C in the last 72 hours. Lipid Profile No results for input(s): CHOL, HDL, LDLCALC, TRIG, CHOLHDL, LDLDIRECT in the last 72 hours. Thyroid function studies No results for input(s): TSH, T4TOTAL, T3FREE, THYROIDAB in the last 72 hours.  Invalid input(s): FREET3 Anemia work up No results for input(s): VITAMINB12, FOLATE, FERRITIN, TIBC, IRON, RETICCTPCT in the last 72 hours. Urinalysis  Component Value Date/Time   COLORURINE YELLOW 06/10/2021 0210   APPEARANCEUR CLEAR 06/10/2021 0210   LABSPEC 1.020 06/10/2021 0210   PHURINE 5.5 06/10/2021 0210   GLUCOSEU >=500 (A) 06/10/2021 0210   HGBUR TRACE (A) 06/10/2021 0210   BILIRUBINUR NEGATIVE 06/10/2021 0210   KETONESUR 15 (A) 06/10/2021 0210   PROTEINUR 30 (A) 06/10/2021 0210   NITRITE NEGATIVE 06/10/2021 0210   LEUKOCYTESUR NEGATIVE 06/10/2021 0210   Sepsis Labs Invalid input(s): PROCALCITONIN,  WBC,  LACTICIDVEN Microbiology Recent Results (from the past 240 hour(s))  Blood Culture (routine x 2)     Status: None   Collection Time: 06/10/21 12:10 AM   Specimen: BLOOD  Result Value Ref Range Status   Specimen Description BLOOD RIGHT ANTECUBITAL  Final   Special Requests   Final    BOTTLES DRAWN AEROBIC AND ANAEROBIC Blood Culture adequate volume   Culture   Final    NO GROWTH 5 DAYS Performed at [facility], [address] [place], [place] [address]    Report Status 06/15/2021 FINAL  Final  Blood Culture (routine x 2)     Status: None   Collection Time: 06/10/21 12:24 AM   Specimen: BLOOD RIGHT HAND  Result Value Ref Range Status   Specimen Description BLOOD RIGHT HAND  Final   Special Requests   Final    BOTTLES DRAWN AEROBIC AND ANAEROBIC Blood Culture results may not be optimal due to an inadequate volume of blood received in culture bottles   Culture   Final    NO GROWTH 5 DAYS Performed at [facility], [address] [place], [place] [address]    Report Status 06/15/2021  FINAL  Final     Time coordinating discharge: 41 minutes.   SIGNED:   Kathlen ModyVijaya Vasiliki Smaldone, MD  [facility]

## 2021-06-21 ENCOUNTER — Ambulatory Visit: Payer: BC Managed Care – PPO | Admitting: Family Medicine

## 2021-06-21 ENCOUNTER — Telehealth: Payer: Self-pay

## 2021-06-21 VITALS — BP 130/80 | HR 106 | Temp 98.1°F | Wt 221.3 lb

## 2021-06-21 DIAGNOSIS — E1110 Type 2 diabetes mellitus with ketoacidosis without coma: Secondary | ICD-10-CM | POA: Diagnosis not present

## 2021-06-21 DIAGNOSIS — E7800 Pure hypercholesterolemia, unspecified: Secondary | ICD-10-CM

## 2021-06-21 DIAGNOSIS — I10 Essential (primary) hypertension: Secondary | ICD-10-CM | POA: Diagnosis not present

## 2021-06-21 LAB — CBC WITH DIFFERENTIAL/PLATELET
Basophils Absolute: 0 10*3/uL (ref 0.0–0.1)
Basophils Relative: 0.4 % (ref 0.0–3.0)
Eosinophils Absolute: 0.1 10*3/uL (ref 0.0–0.7)
Eosinophils Relative: 0.8 % (ref 0.0–5.0)
HCT: 38.9 % — ABNORMAL LOW (ref 39.0–52.0)
Hemoglobin: 13.2 g/dL (ref 13.0–17.0)
Lymphocytes Relative: 26.9 % (ref 12.0–46.0)
Lymphs Abs: 2.1 10*3/uL (ref 0.7–4.0)
MCHC: 33.9 g/dL (ref 30.0–36.0)
MCV: 90.8 fl (ref 78.0–100.0)
Monocytes Absolute: 0.6 10*3/uL (ref 0.1–1.0)
Monocytes Relative: 8.1 % (ref 3.0–12.0)
Neutro Abs: 5 10*3/uL (ref 1.4–7.7)
Neutrophils Relative %: 63.8 % (ref 43.0–77.0)
Platelets: 355 10*3/uL (ref 150.0–400.0)
RBC: 4.29 Mil/uL (ref 4.22–5.81)
RDW: 12.9 % (ref 11.5–15.5)
WBC: 7.8 10*3/uL (ref 4.0–10.5)

## 2021-06-21 LAB — BASIC METABOLIC PANEL
BUN: 12 mg/dL (ref 6–23)
CO2: 24 mEq/L (ref 19–32)
Calcium: 9.6 mg/dL (ref 8.4–10.5)
Chloride: 99 mEq/L (ref 96–112)
Creatinine, Ser: 1.12 mg/dL (ref 0.40–1.50)
GFR: 78.06 mL/min (ref >60.00)
Glucose, Bld: 356 mg/dL — ABNORMAL HIGH (ref 70–99)
Potassium: 4.3 mEq/L (ref 3.5–5.1)
Sodium: 129 mEq/L — ABNORMAL LOW (ref 135–145)

## 2021-06-21 MED ORDER — ONDANSETRON 8 MG PO TBDP: 8.0000 mg | ORAL_TABLET | Freq: Three times a day (TID) | ORAL | 0 refills | Status: AC | PRN

## 2021-06-21 NOTE — Progress Notes (Signed)
Established Patient Office Visit  Subjective:  [name], male    DOB: 16-Sep-1975  Age: 46 y.o. MRN: [id number]  CC:  Chief Complaint  Patient presents with   Follow-up    HPI DESTAN CONVERSE presents for follow-up from recent admission for DKA and COVID.  He had some acute vomiting when he was here for that visit and has had a couple episodes of vomiting since then but generally doing well.  He has a longstanding history of unexplained abdominal pain and intermittent vomiting and has seen multiple GI specialists over the years and had multiple test and multiple endoscopies.  Was placed on Semglee 30 units daily and sliding scale NovoLog but unfortunately had a glitch at the pharmacy and for some reason has been unable to get his insulin filled.  He is back on the metformin and [place].  Recent blood sugars have been fairly consistently 200-300.  Recent A1c 14.2%.  Was not taking any diabetic medications prior to his admission  Does feel stable from a respiratory standpoint.  No chest pains.  Past Medical History:  Diagnosis Date   Median arcuate ligament syndrome [facility])     Past Surgical History:  Procedure Laterality Date   CHOLECYSTECTOMY     LEFT HEART CATH AND CORONARY ANGIOGRAPHY N/A 09/15/2019   Procedure: LEFT HEART CATH AND CORONARY ANGIOGRAPHY;  Surgeon: Belva Crome, MD;  Location: [place] [facility];  Service: Cardiovascular;  Laterality: N/A;    Family History  Problem Relation Age of Onset   Stroke Mother    Diabetes Father    Brain cancer Maternal Grandmother     Social History   Socioeconomic History   Marital status: Married    Spouse name: Not on file   Number of children: Not on file   Years of education: Not on file   Highest education level: Not on file  Occupational History    Employer: [employer]  Tobacco Use   Smoking status: Former    Packs/day: 0.50    Types: Cigarettes    Quit date: 09/16/2019    Years since quitting: 1.7    Smokeless tobacco: Never  Vaping Use   Vaping Use: Never used  Substance and Sexual Activity   Alcohol use: Yes    Comment: social    Drug use: No   Sexual activity: Yes  Other Topics Concern   Not on file  Social History Narrative   Not on file   Social Determinants of Health   Financial Resource Strain: Not on file  Food Insecurity: Not on file  Transportation Needs: Not on file  Physical Activity: Not on file  Stress: Not on file  Social Connections: Not on file  Intimate Partner Violence: Not on file    Outpatient Medications Prior to Visit  Medication Sig Dispense Refill   amLODipine (NORVASC) 10 MG tablet Take 1 tablet (10 mg total) by mouth daily. 90 tablet 2   aspirin 81 MG chewable tablet Chew 1 tablet (81 mg total) by mouth daily. 30 tablet 1   atorvastatin (LIPITOR) 80 MG tablet TAKE 1 TABLET (80 MG TOTAL) BY MOUTH DAILY AT 6 PM. 90 tablet 3   carvedilol (COREG) 25 MG tablet Take 1 tablet (25 mg total) by mouth 2 (two) times daily with a meal. 180 tablet 3   dapagliflozin propanediol (FARXIGA) 10 MG TABS tablet Take 1 tablet (10 mg total) by mouth daily before breakfast. 90 tablet 3  guaiFENesin-dextromethorphan (ROBITUSSIN DM) 100-10 MG/5ML syrup Take 10 mLs by mouth every 4 (four) hours as needed for cough. 118 mL 0   insulin aspart (NOVOLOG) 100 UNIT/ML injection CBG 70 - 120: 0 units  CBG 121 - 150: 1 unit  CBG 151 - 200: 2 units  CBG 201 - 250: 3 units  CBG 251 - 300: 5 units  CBG 301 - 350: 7 units  CBG 351 - 400: 9 units 10 mL 11   insulin glargine-yfgn (SEMGLEE) 100 UNIT/ML injection Inject 0.3 mLs (30 Units total) into the skin daily. 10 mL 11   losartan (COZAAR) 50 MG tablet Take 1 tablet (50 mg total) by mouth daily. 90 tablet 3   metFORMIN (GLUCOPHAGE) 500 MG tablet Take 2 tablets (1,000 mg total) by mouth 2 (two) times daily. 360 tablet 3   nitroGLYCERIN (NITROSTAT) 0.4 MG SL tablet Place 1 tablet (0.4 mg total) under the tongue every 5 (five)  minutes as needed for chest pain. 25 tablet 1   pantoprazole (PROTONIX) 40 MG tablet Take 1 tablet (40 mg total) by mouth daily. 90 tablet 3   ondansetron (ZOFRAN-ODT) 8 MG disintegrating tablet Take 1 tablet (8 mg total) by mouth every 8 (eight) hours as needed for nausea or vomiting. 15 tablet 0   No facility-administered medications prior to visit.    Allergies  Allergen Reactions   Bee Venom    Cat Hair Extract Anxiety, Itching and Other (See Comments)    Itching , watering eyes  Itching , watering eyes  Itching , watering eyes  Itching , watering eyes     Other Itching    ROS Review of Systems  Constitutional:  Negative for fatigue and unexpected weight change.  Eyes:  Negative for visual disturbance.  Respiratory:  Negative for cough, chest tightness and shortness of breath.   Cardiovascular:  Negative for chest pain, palpitations and leg swelling.  Genitourinary:  Negative for dysuria.  Neurological:  Negative for dizziness, syncope, weakness, light-headedness and headaches.     Objective:    Physical Exam Constitutional:      Appearance: He is well-developed.  Eyes:     Pupils: Pupils are equal, round, and reactive to light.  Neck:     Thyroid: No thyromegaly.  Cardiovascular:     Rate and Rhythm: Normal rate and regular rhythm.  Pulmonary:     Effort: Pulmonary effort is normal. No respiratory distress.     Breath sounds: Normal breath sounds. No wheezing or rales.  Abdominal:     Palpations: Abdomen is soft.     Tenderness: There is no abdominal tenderness.  Musculoskeletal:     Cervical back: Neck supple.     Right lower leg: No edema.     Left lower leg: No edema.  Neurological:     Mental Status: He is alert and oriented to person, place, and time.    BP 130/80 (BP Location: Left Arm, Patient Position: Sitting, Cuff Size: Normal)    Pulse (!) 106    Temp 98.1 F (36.7 C) (Oral)    Wt 221 lb 4.8 oz (100.4 kg)    SpO2 99%    BMI 30.01 kg/m  Wt  Readings from Last 3 Encounters:  06/21/21 221 lb 4.8 oz (100.4 kg)  06/15/21 228 lb (103.4 kg)  06/10/21 230 lb (104.3 kg)     Health Maintenance Due  Topic Date Due   COVID-19 Vaccine (1) Never done   FOOT EXAM  Never done  Hepatitis C Screening  Never done   TETANUS/TDAP  Never done   COLONOSCOPY (Pts 45-2yrs Insurance coverage will need to be confirmed)  Never done   OPHTHALMOLOGY EXAM  12/24/2020   INFLUENZA VACCINE  Never done    There are no preventive care reminders to display for this patient.  Lab Results  Component Value Date   TSH 0.847 09/13/2019   Lab Results  Component Value Date   WBC 7.3 06/12/2021   HGB 13.1 06/12/2021   HCT 37.9 (L) 06/12/2021   MCV 88.6 06/12/2021   PLT 316 06/12/2021   Lab Results  Component Value Date   NA 131 (L) 06/12/2021   K 3.9 06/12/2021   CO2 19 (L) 06/12/2021   GLUCOSE 306 (H) 06/12/2021   BUN 9 06/12/2021   CREATININE 1.18 06/12/2021   BILITOT 0.4 06/12/2021   ALKPHOS 93 06/12/2021   AST 30 06/12/2021   ALT 51 (H) 06/12/2021   PROT 6.0 (L) 06/12/2021   ALBUMIN 2.8 (L) 06/12/2021   CALCIUM 8.3 (L) 06/12/2021   ANIONGAP 9 06/12/2021   Lab Results  Component Value Date   CHOL 84 (L) 12/09/2019   Lab Results  Component Value Date   HDL 29 (L) 12/09/2019   Lab Results  Component Value Date   LDLCALC 30 12/09/2019   Lab Results  Component Value Date   TRIG 144 12/09/2019   Lab Results  Component Value Date   CHOLHDL 2.9 12/09/2019   Lab Results  Component Value Date   HGBA1C 14.2 (H) 06/10/2021      Assessment & Plan:   #1 type 2 diabetes with recent hospitalization for diabetic ketoacidosis.  Patient with history of poor compliance with medications.  He is back on Farxiga and metformin but unable to get either of his insulins filled thus far.  Home blood sugars have been consistently high.  -We will call pharmacy this afternoon to see if we can sort out why his insulins have not been approved  yet -Needs to set up diabetic eye exam -We need to consider urine microalbumin will be recheck A1c in a couple months -Continue close home monitoring of blood sugars -Hope to get him on immediate acting and basal insulin very soon in the next couple of days  #2 hyperlipidemia on high-dose Lipitor -Recheck lipid and hepatic panel with next blood draw we will recheck A1c in a couple months  #3 hypertension- stable by today's reading   Meds ordered this encounter  Medications   ondansetron (ZOFRAN-ODT) 8 MG disintegrating tablet    Sig: Take 1 tablet (8 mg total) by mouth every 8 (eight) hours as needed for nausea or vomiting.    Dispense:  15 tablet    Refill:  0    Follow-up: No follow-ups on file.    Carolann Littler, MD

## 2021-06-21 NOTE — Patient Instructions (Signed)
We will be calling this afternoon to try to sort out the insulin issue.    Be sure to set up 2 month follow up.

## 2021-06-21 NOTE — Telephone Encounter (Signed)
Spoke with the pharmacy. They never received the Rx for novolog and smeglee is not covered. She stated that basaglar is covered by the patients insurance.   Please advise.

## 2021-06-22 MED ORDER — INSULIN ASPART 100 UNIT/ML IJ SOLN: INTRAMUSCULAR | 11 refills | Status: DC

## 2021-06-22 MED ORDER — BASAGLAR TEMPO PEN 100 UNIT/ML ~~LOC~~ SOPN: 30.0000 [IU] | PEN_INJECTOR | Freq: Every day | SUBCUTANEOUS | 3 refills | Status: DC

## 2021-06-22 NOTE — Telephone Encounter (Signed)
Prescriptions have been sent in. Spoke with the patient he is aware of the changes and that the rx has been sent in.

## 2021-06-22 NOTE — Addendum Note (Signed)
Addended by: Solon Augusta on: 06/22/2021 08:09 AM   Modules accepted: Orders

## 2021-07-12 ENCOUNTER — Telehealth: Payer: Medicaid Other | Admitting: Family Medicine

## 2021-07-13 ENCOUNTER — Other Ambulatory Visit: Payer: Self-pay

## 2021-07-13 ENCOUNTER — Ambulatory Visit (INDEPENDENT_AMBULATORY_CARE_PROVIDER_SITE_OTHER): Payer: BC Managed Care – PPO | Admitting: Cardiology

## 2021-07-13 ENCOUNTER — Encounter (HOSPITAL_BASED_OUTPATIENT_CLINIC_OR_DEPARTMENT_OTHER): Payer: Self-pay | Admitting: Cardiology

## 2021-07-13 VITALS — BP 145/97 | HR 96 | Ht 72.0 in | Wt 226.0 lb

## 2021-07-13 DIAGNOSIS — Z794 Long term (current) use of insulin: Secondary | ICD-10-CM

## 2021-07-13 DIAGNOSIS — I10 Essential (primary) hypertension: Secondary | ICD-10-CM | POA: Diagnosis not present

## 2021-07-13 DIAGNOSIS — E7800 Pure hypercholesterolemia, unspecified: Secondary | ICD-10-CM

## 2021-07-13 DIAGNOSIS — I255 Ischemic cardiomyopathy: Secondary | ICD-10-CM | POA: Diagnosis not present

## 2021-07-13 DIAGNOSIS — I252 Old myocardial infarction: Secondary | ICD-10-CM | POA: Diagnosis not present

## 2021-07-13 DIAGNOSIS — I2510 Atherosclerotic heart disease of native coronary artery without angina pectoris: Secondary | ICD-10-CM | POA: Diagnosis not present

## 2021-07-13 DIAGNOSIS — E1165 Type 2 diabetes mellitus with hyperglycemia: Secondary | ICD-10-CM

## 2021-07-13 NOTE — Progress Notes (Signed)
Cardiology Office Note:    Date:  07/13/2021   ID:  [name], DOB [birth year], MRN [id number]  PCP:  Kristian Covey, MD  Cardiologist:  Jodelle Red, MD   Referring MD: Kristian Covey, MD   Chief Complaint: follow up  History of Present Illness:    Kirk Barker is a 46 y.o. male with a history of CAD s/p NSTEMI 09/13/2019 treated with DES to ramus branch, hypertension, hyperlipidemia, type 2 diabetes mellitus, chronic epigastric pain, pancreatitis, GERD, tobacco use, and median arcuate ligament syndrome seen for follow up today.  Cardiac history: 08/2019 admission for NSTEMI. Cath showed high-grade thrombotic obstruction in the equivalent of the ramus intermedius prior to bifurcation into 2 large branches. Successfully treated with PCI/DES to perpendicular branch reducing the 99% stenosis to 0% with TIMI grade III flow. Otherwise, non-obstructive CAD. Patient started on dual antiplatelet therapy, beta blocker, and high-intensity statin. Of note, patient markedly hypertensive on presentation with BP as high as the 200's/110's. LDL 126 and Hemoglobin A1c 11.6.   Today: Recently admitted with Covid 06/09/21, found to have anion gap metabolic acidosis. Feeling much better now.  He is doing well with no major concerns. For exercise, he walks for a few miles in the park daily with no exertional symptoms. He is tolerating his medications. He is recovering well since being infected with COVID and being hospitalized last month.   He does not record his blood pressure at home regularly. However, he does have a home machine.   Denies chest pain, shortness of breath at rest or with normal exertion. No PND, orthopnea, LE edema or unexpected weight gain. No syncope or palpitations.  Past Medical History:  Diagnosis Date   Median arcuate ligament syndrome [facility])     Past Surgical History:  Procedure Laterality Date   CHOLECYSTECTOMY     LEFT HEART CATH AND CORONARY ANGIOGRAPHY  N/A 09/15/2019   Procedure: LEFT HEART CATH AND CORONARY ANGIOGRAPHY;  Surgeon: Lyn Records, MD;  Location: [facility];  Service: Cardiovascular;  Laterality: N/A;    Current Medications: Current Meds  Medication Sig   amLODipine (NORVASC) 10 MG tablet Take 1 tablet (10 mg total) by mouth daily.   aspirin 81 MG chewable tablet Chew 1 tablet (81 mg total) by mouth daily.   atorvastatin (LIPITOR) 80 MG tablet TAKE 1 TABLET (80 MG TOTAL) BY MOUTH DAILY AT 6 PM.   carvedilol (COREG) 25 MG tablet Take 1 tablet (25 mg total) by mouth 2 (two) times daily with a meal.   dapagliflozin propanediol (FARXIGA) 10 MG TABS tablet Take 1 tablet (10 mg total) by mouth daily before breakfast.   guaiFENesin-dextromethorphan (ROBITUSSIN DM) 100-10 MG/5ML syrup Take 10 mLs by mouth every 4 (four) hours as needed for cough.   insulin aspart (NOVOLOG) 100 UNIT/ML injection CBG 70 - 120: 0 units  CBG 121 - 150: 1 unit  CBG 151 - 200: 2 units  CBG 201 - 250: 3 units  CBG 251 - 300: 5 units  CBG 301 - 350: 7 units  CBG 351 - 400: 9 units   Insulin Glargine w/ Trans Port (BASAGLAR TEMPO PEN) 100 UNIT/ML SOPN Inject 30 Units into the skin daily. Inject 30 units into the skin daily.   losartan (COZAAR) 50 MG tablet Take 1 tablet (50 mg total) by mouth daily.   metFORMIN (GLUCOPHAGE) 500 MG tablet Take 2 tablets (1,000 mg total) by mouth 2 (two) times daily.   nitroGLYCERIN (  NITROSTAT) 0.4 MG SL tablet Place 1 tablet (0.4 mg total) under the tongue every 5 (five) minutes as needed for chest pain.   ondansetron (ZOFRAN-ODT) 8 MG disintegrating tablet Take 1 tablet (8 mg total) by mouth every 8 (eight) hours as needed for nausea or vomiting.   pantoprazole (PROTONIX) 40 MG tablet Take 1 tablet (40 mg total) by mouth daily.     Allergies:   Bee venom, Cat hair extract, and Other   Social History   Tobacco Use   Smoking status: Former    Packs/day: 0.50    Types: Cigarettes    Quit date: 09/16/2019     Years since quitting: 1.8   Smokeless tobacco: Never  Vaping Use   Vaping Use: Never used  Substance Use Topics   Alcohol use: Yes    Comment: social    Drug use: No    Family History: The patient's family history includes Brain cancer in his maternal grandmother; Diabetes in his father; Stroke in his mother.  ROS:   Please see the history of present illness.  Remainder of review of systems otherwise unremarkable.  EKGs/Labs/Other Studies Reviewed:    The following studies were reviewed today: Limited Echo 09/13/2019: Impressions: 1. Left ventricular ejection fraction, by estimation, is 60 to 65%. The  left ventricle has normal function. The left ventricle demonstrates  regional wall motion abnormalities (see scoring diagram/findings for  description). There is mild concentric left  ventricular hypertrophy. Left ventricular diastolic function could not be  evaluated. There is hypokinesis of the left ventricular, entire  inferolateral wall and inferior wall.   2. Right ventricular systolic function was not well visualized. The right  ventricular size is normal.   3. The mitral valve is normal in structure. Trivial mitral valve  regurgitation. No evidence of mitral stenosis.   4. The aortic valve was not assessed.  _______________  Complete Echo 09/14/2019: Impressions:  1. Left ventricular ejection fraction, by estimation, is 40 to 45%. The  left ventricle has mildly decreased function. The left ventricle  demonstrates regional wall motion abnormalities. Global hypokinesis,  appears worse in lateral wall. There is  moderate left ventricular hypertrophy. Left ventricular diastolic  parameters are consistent with Grade I diastolic dysfunction (impaired  relaxation).   2. Right ventricular systolic function is normal. The right ventricular  size is normal. Tricuspid regurgitation signal is inadequate for assessing  PA pressure.   3. The mitral valve is normal in structure. No  evidence of mitral valve  regurgitation.   4. The aortic valve is tricuspid. Aortic valve regurgitation is not  visualized. No aortic stenosis is present.   5. The inferior vena cava is normal in size with greater than 50%  respiratory variability, suggesting right atrial pressure of 3 mmHg.   6. Compared to prior study on 09/13/19, no significant change  _______________  Left Heart Catheterization 09/15/2019: Non-ST elevation myocardial infarction without EKG changes but abnormal high-sensitivity troponin I ischemic biomarkers. High-grade, thrombotic obstruction in the equivalent of the ramus intermedius prior to a bifurcation into 2 large branches.  The more perpendicular branch contained proximal 40% narrowing with widely patent ostium. Successful PCI and stent implantation across the perpendicular branch reducing the 99% stenosis to 0% with TIMI grade III flow.  The device was a 2.5 x 18 Onyx postdilated to 2.75 mm in the proximal two thirds of the stent. Widely patent left main 30% proximal LAD with mild to moderate diffuse mid and distal disease. Widely patent circumflex  with minimal luminal irregularities. Codominant/dominant right coronary with 75% stenosis in the distal one third of the first left ventricular branch. Mildly reduced to low normal LV systolic function with EF 45 to 50%.  No obvious regional wall motion abnormality noted. Hypertension treated with intracoronary nitroglycerin and 2 doses of IV labetalol totaling 20 mg.   Recommendations: Preventive therapy: Aggressive lipid-lowering to LDL less than 70.  Glycemic control to hemoglobin A1c less than 7.  Blood pressure control to 130/80 mmHg or less.  Smoking cessation. I increased losartan to 50 mg/day.  May need to increase carvedilol to 25 mg twice daily. Aspirin and Brilinta x12 months. Discharge in a.m.  EKG:  EKG was personally reviewed.  2:15/23: not ordered today 09/26/19: normal sinus rhythm, rate 78 bpm, with no  acute ST/T changes. Normal axis. Normal PR and QRS intervals. QTc 392 ms.  Recent Labs: 06/10/2021: B Natriuretic Peptide 20.0; Magnesium 1.7 06/12/2021: ALT 51 06/21/2021: BUN 12; Creatinine, Ser 1.12; Hemoglobin 13.2; Platelets 355.0; Potassium 4.3; Sodium 129  Recent Lipid Panel    Component Value Date/Time   CHOL 84 (L) 12/09/2019 1015   TRIG 144 12/09/2019 1015   HDL 29 (L) 12/09/2019 1015   CHOLHDL 2.9 12/09/2019 1015   CHOLHDL 4.7 09/14/2019 0332   VLDL 12 09/14/2019 0332   LDLCALC 30 12/09/2019 1015    Physical Exam:    Vital Signs: BP (!) 145/97    Pulse 96    Ht 6' (1.829 m)    Wt 226 lb (102.5 kg)    SpO2 99%    BMI 30.65 kg/m     Wt Readings from Last 3 Encounters:  07/13/21 226 lb (102.5 kg)  06/21/21 221 lb 4.8 oz (100.4 kg)  06/15/21 228 lb (103.4 kg)    GEN: Well nourished, well developed in no acute distress HEENT: Normal, moist mucous membranes NECK: No JVD CARDIAC: regular rhythm, normal S1 and S2, no rubs or gallops. No murmur. VASCULAR: Radial and DP pulses 2+ bilaterally. No carotid bruits RESPIRATORY:  Clear to auscultation without rales, wheezing or rhonchi  ABDOMEN: Soft, non-tender, non-distended MUSCULOSKELETAL:  Ambulates independently SKIN: Warm and dry, no edema NEUROLOGIC:  Alert and oriented x 3. No focal neuro deficits noted. PSYCHIATRIC:  Normal affect   Assessment:    1. Coronary artery disease involving native coronary artery of native heart without angina pectoris   2. History of non-ST elevation myocardial infarction (NSTEMI)   3. Ischemic cardiomyopathy   4. Essential hypertension   5. Type 2 diabetes mellitus with hyperglycemia, with long-term current use of insulin (HCC)   6. Pure hypercholesterolemia    Plan:    CAD with prior NSTEMI 08/2019 -s/p PCI/DES to ramus branch. Otherwise, non-obstructive CAD. -continue aspirin 81 mg daily, completed ticagrelor 12 mos -continue carvedilol, atorvastatin -continue  dapagliflozin  Ischemic Cardiomyopathy -Echo showed LVEF of 40-45% with global hypokinesis, worse in the lateral wall, and grade 1 diastolic dysfunction.  -euvolemic on exam, NYHA class I -continue carvedilol, losartan, dapagliflozin  Hypertension -goal of <130/80, elevated today -he will check home BP and send me numbers. If remains consistently elevated, we will need to adjust regimen. Last Cr 1.12 (had pseudohyponatremia on that lab due to glucose of 356), could change to valsartan or start entresto if K and renal function allow -continue carvedilol, losartan as above  Hypercholesterolemia -LDL goal <70 -last LDL 11/2019 30, at goal. Due for recheck, given lab slips today -continue atorvastatin 80 mg daily  Type 2 Diabetes  Mellitus, uncontrolled -continue dapagliflozin given CAD and reduced EF -on metformin as well -working with Dr. Caryl NeverBurchette on management, stressed importance of this. Now on insulin aspart and glargine. Most recent A1c 14.2  CV risk counseling and secondary prevention -recommend heart healthy/Mediterranean diet, with whole grains, fruits, vegetable, fish, lean meats, nuts, and olive oil. Limit salt. -recommend moderate walking, 3-5 times/week for 30-50 minutes each session. Aim for at least 150 minutes.week. Goal should be pace of 3 miles/hours, or walking 1.5 miles in 30 minutes -recommend avoidance of tobacco products. Avoid excess alcohol. He has continued to remain tobacco free since discharge from hospital  Plan for follow up: 6 months or sooner as needed  Medication Adjustments/Labs and Tests Ordered: Current medicines are reviewed at length with the patient today.  Concerns regarding medicines are outlined above.  Orders Placed This Encounter  Procedures   Lipid Profile   No orders of the defined types were placed in this encounter.   Patient Instructions  Medication Instructions:  No change *If you need a refill on your cardiac medications before  your next appointment, please call your pharmacy*   Lab Work: Cholesterol If you have labs (blood work) drawn today and your tests are completely normal, you will receive your results only by: MyChart Message (if you have MyChart) OR A paper copy in the mail If you have any lab test that is abnormal or we need to change your treatment, we will call you to review the results.   Testing/Procedures: how to check blood pressure:  -sit comfortably in a chair, feet uncrossed and flat on floor, for 5-10 minutes  -arm ideally should rest at the level of the heart. However, arm should be relaxed and not tense (for example, do not hold the arm up unsupported)  -avoid exercise, caffeine, and tobacco for at least 30 minutes prior to BP reading  -don't take BP cuff reading over clothes (always place on skin directly)  -I prefer to know how well the medication is working, so I would like you to take your readings 1-2 hours after taking your blood pressure medication if possible    Follow-Up: At [facility], you and your health needs are our priority.  As part of our continuing mission to provide you with exceptional heart care, we have created designated Provider Care Teams.  These Care Teams include your primary Cardiologist (physician) and Advanced Practice Providers (APPs -  Physician Assistants and Nurse Practitioners) who all work together to provide you with the care you need, when you need it.  We recommend signing up for the patient portal called "MyChart".  Sign up information is provided on this After Visit Summary.  MyChart is used to connect with patients for Virtual Visits (Telemedicine).  Patients are able to view lab/test results, encounter notes, upcoming appointments, etc.  Non-urgent messages can be sent to your provider as well.   To learn more about what you can do with MyChart, go to [email or web address].    Your next appointment:   6 month(s)  The format for your next  appointment:   In Person  Provider:   Jodelle RedBridgette Marisah Laker, MD    Other Instructions If you don't get blood work with Dr. Caryl NeverBurchette, these are lab slips to get your cholesterol checked  Send me some blood pressure numbers via mychart in a few weeks.    I,Mykaella Javier,acting as a scribe for [employer]get[employer] Gloria Lambertson, MD.,have documented all relevant documentation on the behalf of Jodelle RedBridgette Tysheka Fanguy, MD,as directed  by  Jodelle RedBridgette Jaquetta Currier, MD while in the presence of Jodelle RedBridgette Conley Pawling, MD.  I, Jodelle RedBridgette Johnnetta Holstine, MD, have reviewed all documentation for this visit. The documentation on 07/13/21 for the exam, diagnosis, procedures, and orders are all accurate and complete.   Signed, Jodelle RedBridgette Adeja Sarratt, MD  07/13/2021   [facility]

## 2021-07-13 NOTE — Patient Instructions (Signed)
Medication Instructions:  No change *If you need a refill on your cardiac medications before your next appointment, please call your pharmacy*   Lab Work: Cholesterol If you have labs (blood work) drawn today and your tests are completely normal, you will receive your results only by: MyChart Message (if you have MyChart) OR A paper copy in the mail If you have any lab test that is abnormal or we need to change your treatment, we will call you to review the results.   Testing/Procedures: how to check blood pressure:  -sit comfortably in a chair, feet uncrossed and flat on floor, for 5-10 minutes  -arm ideally should rest at the level of the heart. However, arm should be relaxed and not tense (for example, do not hold the arm up unsupported)  -avoid exercise, caffeine, and tobacco for at least 30 minutes prior to BP reading  -don't take BP cuff reading over clothes (always place on skin directly)  -I prefer to know how well the medication is working, so I would like you to take your readings 1-2 hours after taking your blood pressure medication if possible    Follow-Up: At [facility], you and your health needs are our priority.  As part of our continuing mission to provide you with exceptional heart care, we have created designated Provider Care Teams.  These Care Teams include your primary Cardiologist (physician) and Advanced Practice Providers (APPs -  Physician Assistants and Nurse Practitioners) who all work together to provide you with the care you need, when you need it.  We recommend signing up for the patient portal called "MyChart".  Sign up information is provided on this After Visit Summary.  MyChart is used to connect with patients for Virtual Visits (Telemedicine).  Patients are able to view lab/test results, encounter notes, upcoming appointments, etc.  Non-urgent messages can be sent to your provider as well.   To learn more about what you can do with MyChart, go to  [email or web address].    Your next appointment:   6 month(s)  The format for your next appointment:   In Person  Provider:   Jodelle Red, MD    Other Instructions If you don't get blood work with Dr. Caryl Never, these are lab slips to get your cholesterol checked  Send me some blood pressure numbers via mychart in a few weeks.

## 2021-07-15 ENCOUNTER — Telehealth (INDEPENDENT_AMBULATORY_CARE_PROVIDER_SITE_OTHER): Payer: BC Managed Care – PPO | Admitting: Family Medicine

## 2021-07-15 ENCOUNTER — Other Ambulatory Visit: Payer: Self-pay

## 2021-07-15 DIAGNOSIS — E1159 Type 2 diabetes mellitus with other circulatory complications: Secondary | ICD-10-CM | POA: Diagnosis not present

## 2021-07-15 DIAGNOSIS — F419 Anxiety disorder, unspecified: Secondary | ICD-10-CM | POA: Diagnosis not present

## 2021-07-15 DIAGNOSIS — Z794 Long term (current) use of insulin: Secondary | ICD-10-CM

## 2021-07-15 MED ORDER — BASAGLAR TEMPO PEN 100 UNIT/ML ~~LOC~~ SOPN: 30.0000 [IU] | PEN_INJECTOR | Freq: Every day | SUBCUTANEOUS | 3 refills | Status: DC

## 2021-07-15 MED ORDER — INSULIN ASPART 100 UNIT/ML IJ SOLN: INTRAMUSCULAR | 11 refills | Status: DC

## 2021-07-15 NOTE — Telephone Encounter (Signed)
Patient was seen by virtual visit today and stated that he has not gotten his insuline. I called and spoke with the pharmacy and they stated they have not received the novolog, I have re-sent the Rx, and that basaglar is not covered. Called and spoke with the patient and he is going to call the insurance company to see what is covered.

## 2021-07-15 NOTE — Progress Notes (Signed)
[name], male   DOB: [birth year], 46 y.o.   MRN: [id number]  This visit type was conducted due to national recommendations for restrictions regarding the COVID-19 pandemic in an effort to limit this patient's exposure and mitigate transmission in our community.   Virtual Visit via Video Note  I connected with Kirk Barker on 07/15/21 at  1:45 PM EST by a video enabled telemedicine application and verified that I am speaking with the correct person using two identifiers.  Location patient: home Location provider:work or home office Persons participating in the virtual visit: patient, provider  I discussed the limitations of evaluation and management by telemedicine and the availability of in person appointments. The patient expressed understanding and agreed to proceed.   HPI:  Kirk Barker has history of CAD, uncontrolled type 2 diabetes, hypertension, history of pancreatitis, recent admission for diabetic ketoacidosis.  He also had recent COVID infection.  Currently works for [employer] of [place].  They do not have a mask policy.  He is very anxious regarding his risk of acquiring COVID again especially in view of his recent admission for diabetic ketoacidosis.  He has had increased anxiety symptoms including anxiousness and decreased sleep recently.  He is also concerned because of his high risk of complications with his diabetes and other comorbidities.  He is currently working from home and basically asking for a letter to support use of him staying at home for at least the next month.  He is planning to get some counseling through work assistance to help assist with some of the anxiety issues.  He is requesting to work from home through end of March in order to address his anxiety issues and also to get his diabetes stabilized.    Regarding his diabetes he remains on Jardiance and metformin.  Blood sugar still frequently over 200.  He is supposed to be on Basaglar and NovoLog sliding  scale.  We sent over prescription for NovoLog but apparently the pharmacy for some reason never filled these.  We were told by his insurance that they would cover Basaglar and then he was told by the pharmacist they would not cover this.  We are trying to get this clarified.  He has follow-up in April to recheck A1c   ROS: See pertinent positives and negatives per HPI.  Past Medical History:  Diagnosis Date   Median arcuate ligament syndrome [facility])     Past Surgical History:  Procedure Laterality Date   CHOLECYSTECTOMY     LEFT HEART CATH AND CORONARY ANGIOGRAPHY N/A 09/15/2019   Procedure: LEFT HEART CATH AND CORONARY ANGIOGRAPHY;  Surgeon: Lyn Records, MD;  Location: [facility] INVASIVE CV LAB;  Service: Cardiovascular;  Laterality: N/A;    Family History  Problem Relation Age of Onset   Stroke Mother    Diabetes Father    Brain cancer Maternal Grandmother     SOCIAL HX: Quit smoking 2021   Current Outpatient Medications:    amLODipine (NORVASC) 10 MG tablet, Take 1 tablet (10 mg total) by mouth daily., Disp: 90 tablet, Rfl: 2   aspirin 81 MG chewable tablet, Chew 1 tablet (81 mg total) by mouth daily., Disp: 30 tablet, Rfl: 1   atorvastatin (LIPITOR) 80 MG tablet, TAKE 1 TABLET (80 MG TOTAL) BY MOUTH DAILY AT 6 PM., Disp: 90 tablet, Rfl: 3   carvedilol (COREG) 25 MG tablet, Take 1 tablet (25 mg total) by mouth 2 (two) times daily with a meal., Disp: 180 tablet, Rfl:  3   dapagliflozin propanediol (FARXIGA) 10 MG TABS tablet, Take 1 tablet (10 mg total) by mouth daily before breakfast., Disp: 90 tablet, Rfl: 3   insulin aspart (NOVOLOG) 100 UNIT/ML injection, CBG 70 - 120: 0 units  CBG 121 - 150: 1 unit  CBG 151 - 200: 2 units  CBG 201 - 250: 3 units  CBG 251 - 300: 5 units  CBG 301 - 350: 7 units  CBG 351 - 400: 9 units, Disp: 10 mL, Rfl: 11   Insulin Glargine w/ Trans Port (BASAGLAR TEMPO PEN) 100 UNIT/ML SOPN, Inject 30 Units into the skin daily. Inject 30 units into the skin daily.,  Disp: 3 mL, Rfl: 3   losartan (COZAAR) 50 MG tablet, Take 1 tablet (50 mg total) by mouth daily., Disp: 90 tablet, Rfl: 3   metFORMIN (GLUCOPHAGE) 500 MG tablet, Take 2 tablets (1,000 mg total) by mouth 2 (two) times daily., Disp: 360 tablet, Rfl: 3   nitroGLYCERIN (NITROSTAT) 0.4 MG SL tablet, Place 1 tablet (0.4 mg total) under the tongue every 5 (five) minutes as needed for chest pain., Disp: 25 tablet, Rfl: 1   ondansetron (ZOFRAN-ODT) 8 MG disintegrating tablet, Take 1 tablet (8 mg total) by mouth every 8 (eight) hours as needed for nausea or vomiting., Disp: 15 tablet, Rfl: 0   pantoprazole (PROTONIX) 40 MG tablet, Take 1 tablet (40 mg total) by mouth daily., Disp: 90 tablet, Rfl: 3  EXAM:  VITALS per patient if applicable:  GENERAL: alert, oriented, appears well and in no acute distress  HEENT: atraumatic, conjunttiva clear, no obvious abnormalities on inspection of external nose and ears  NECK: normal movements of the head and neck  LUNGS: on inspection no signs of respiratory distress, breathing rate appears normal, no obvious gross SOB, gasping or wheezing  CV: no obvious cyanosis  MS: moves all visible extremities without noticeable abnormality  PSYCH/NEURO: pleasant and cooperative, no obvious depression or anxiety, speech and thought processing grossly intact  ASSESSMENT AND PLAN:  Discussed the following assessment and plan:  #1 poorly controlled type 2 diabetes with recent DKA.  Patient currently not on insulin.  We are trying to sort out his insulin between his insurance and pharmacy.   Continue with Metformin and Jardiance and needs to be on basal insulin and Novolog.  #2 history of recent COVID infection.  Patient has had some anxiety symptoms regarding risk of recurrent infection.   Currently asking for letter of support to stay at home for the next month because of increased risk at his workplace of infection.  He is also seeking counseling regarding anxiety  symptoms he had since his COVID infection.  30 minutes spent reviewing issues above, clarifying with pharmacy his insulin issue and generating letter for at home work recommendation through end of March.      I discussed the assessment and treatment plan with the patient. The patient was provided an opportunity to ask questions and all were answered. The patient agreed with the plan and demonstrated an understanding of the instructions.   The patient was advised to call back or seek an in-person evaluation if the symptoms worsen or if the condition fails to improve as anticipated.     Evelena Peat, MD

## 2021-08-15 ENCOUNTER — Telehealth (INDEPENDENT_AMBULATORY_CARE_PROVIDER_SITE_OTHER): Payer: BC Managed Care – PPO | Admitting: Family Medicine

## 2021-08-15 ENCOUNTER — Encounter: Payer: Self-pay | Admitting: Family Medicine

## 2021-08-15 ENCOUNTER — Other Ambulatory Visit: Payer: Self-pay

## 2021-08-15 ENCOUNTER — Telehealth: Payer: Self-pay | Admitting: Family Medicine

## 2021-08-15 VITALS — Ht 72.0 in | Wt 226.0 lb

## 2021-08-15 DIAGNOSIS — Z794 Long term (current) use of insulin: Secondary | ICD-10-CM

## 2021-08-15 DIAGNOSIS — E1159 Type 2 diabetes mellitus with other circulatory complications: Secondary | ICD-10-CM

## 2021-08-15 DIAGNOSIS — K219 Gastro-esophageal reflux disease without esophagitis: Secondary | ICD-10-CM | POA: Diagnosis not present

## 2021-08-15 NOTE — Telephone Encounter (Signed)
Patient called in requesting to speak with Dr.Burchette. Patient doesn't want to wait so he decided to schedule an appointment to speak with Dr.Burchette ? ?Please advise ?

## 2021-08-15 NOTE — Progress Notes (Signed)
[name], male   DOB: Mar 02, 1976, 46 y.o.   MRN: [id number] ? ?This visit type was conducted due to national recommendations for restrictions regarding the COVID-19 pandemic in an effort to limit this patient's exposure and mitigate transmission in our community.  ? ?Virtual Visit via Video Note ? ?I connected with [name] on 08/15/21 at  3:00 PM EDT by a video enabled telemedicine application and verified that I am speaking with the correct person using two identifiers. ? Location patient: home ?Location provider:work or home office ?Persons participating in the virtual visit: patient, provider ? ?I discussed the limitations of evaluation and management by telemedicine and the availability of in person appointments. The patient expressed understanding and agreed to proceed. ? ? ?HPI: ? ?Called to discuss several things as follows ? ?Chronic GI complaints.  He has had complaints of frequent substernal burning over many years.  He states he has had at least 5 endoscopic procedures.  Has apparently been seen at [place], [facility], [place], [facility].  He states he has had extensive evaluations with no clear etiology.  He has been on multiple PPIs including current use of Protonix without any improvement.  No recent appetite change or weight loss.  He states that he was basically referred to a pain management clinic at 1 point.  He is specifically requesting GI referral.  No recent vomiting.  No melena. ? ?Type 2 diabetes.  He has had tremendous difficulties getting his insulin because of insurance issues.  He finally got his NovoLog filled.  Has not yet started that.  Recent diabetic ketoacidosis.  Not monitoring blood sugars regularly.  No significant polydipsia or polyuria. ? ?We have filled out some FMLA forms recently because of his multiple illnesses and recent absences.  This would be for intermittently.  We discussed 8 hours per episode and up to 8 episodes per month. ? ? ?ROS: See pertinent  positives and negatives per HPI. ? ?Past Medical History:  ?Diagnosis Date  ? Median arcuate ligament syndrome (HCC)   ? ? ?Past Surgical History:  ?Procedure Laterality Date  ? CHOLECYSTECTOMY    ? LEFT HEART CATH AND CORONARY ANGIOGRAPHY N/A 09/15/2019  ? Procedure: LEFT HEART CATH AND CORONARY ANGIOGRAPHY;  Surgeon: Lyn Records, MD;  Location: [facility] INVASIVE CV LAB;  Service: Cardiovascular;  Laterality: N/A;  ? ? ?Family History  ?Problem Relation Age of Onset  ? Stroke Mother   ? Diabetes Father   ? Brain cancer Maternal Grandmother   ? ? ?SOCIAL HX: Quit smoking 2021. ? ? ?Current Outpatient Medications:  ?  amLODipine (NORVASC) 10 MG tablet, Take 1 tablet (10 mg total) by mouth daily., Disp: 90 tablet, Rfl: 2 ?  aspirin 81 MG chewable tablet, Chew 1 tablet (81 mg total) by mouth daily., Disp: 30 tablet, Rfl: 1 ?  atorvastatin (LIPITOR) 80 MG tablet, TAKE 1 TABLET (80 MG TOTAL) BY MOUTH DAILY AT 6 PM., Disp: 90 tablet, Rfl: 3 ?  carvedilol (COREG) 25 MG tablet, Take 1 tablet (25 mg total) by mouth 2 (two) times daily with a meal., Disp: 180 tablet, Rfl: 3 ?  dapagliflozin propanediol (FARXIGA) 10 MG TABS tablet, Take 1 tablet (10 mg total) by mouth daily before breakfast., Disp: 90 tablet, Rfl: 3 ?  insulin aspart (NOVOLOG) 100 UNIT/ML injection, CBG 70 - 120: 0 units  CBG 121 - 150: 1 unit  CBG 151 - 200: 2 units  CBG 201 - 250: 3 units  CBG  251 - 300: 5 units  CBG 301 - 350: 7 units  CBG 351 - 400: 9 units, Disp: 10 mL, Rfl: 11 ?  Insulin Glargine w/ Trans Port (BASAGLAR TEMPO PEN) 100 UNIT/ML SOPN, Inject 30 Units into the skin daily. Inject 30 units into the skin daily., Disp: 3 mL, Rfl: 3 ?  losartan (COZAAR) 50 MG tablet, Take 1 tablet (50 mg total) by mouth daily., Disp: 90 tablet, Rfl: 3 ?  metFORMIN (GLUCOPHAGE) 500 MG tablet, Take 2 tablets (1,000 mg total) by mouth 2 (two) times daily., Disp: 360 tablet, Rfl: 3 ?  nitroGLYCERIN (NITROSTAT) 0.4 MG SL tablet, Place 1 tablet (0.4 mg total) under the  tongue every 5 (five) minutes as needed for chest pain., Disp: 25 tablet, Rfl: 1 ?  ondansetron (ZOFRAN-ODT) 8 MG disintegrating tablet, Take 1 tablet (8 mg total) by mouth every 8 (eight) hours as needed for nausea or vomiting., Disp: 15 tablet, Rfl: 0 ?  pantoprazole (PROTONIX) 40 MG tablet, Take 1 tablet (40 mg total) by mouth daily., Disp: 90 tablet, Rfl: 3 ? ?EXAM: ? ?VITALS per patient if applicable: ? ?GENERAL: alert, oriented, appears well and in no acute distress ? ?HEENT: atraumatic, conjunttiva clear, no obvious abnormalities on inspection of external nose and ears ? ?NECK: normal movements of the head and neck ? ?LUNGS: on inspection no signs of respiratory distress, breathing rate appears normal, no obvious gross SOB, gasping or wheezing ? ?CV: no obvious cyanosis ? ?MS: moves all visible extremities without noticeable abnormality ? ?PSYCH/NEURO: pleasant and cooperative, no obvious depression or anxiety, speech and thought processing grossly intact ? ?ASSESSMENT AND PLAN: ? ?Discussed the following assessment and plan: ? ?#1 longstanding history for several years of substernal burning.  Has been on multiple PPIs without much relief.  He states he had at least 5 endoscopic procedures which have been basically unremarkable.  He cannot give details regarding other work-up previously.  He is requesting referral to local GI.  Does not report any recent appetite change or weight loss. ? ?#2 type 2 diabetes.  Recent diabetic ketoacidosis.  We had tremendous difficulties getting him on insulin.  He finally got his NovoLog filled and has sliding scale to use for 3 times daily on that.  He initially had follow-up appointment in 4 days from now to be seen but since he is just getting his insulin he would like to delay that another couple months.  He is taking his [place] regularly and Hospital doctor ? ? ?  ?I discussed the assessment and treatment plan with the patient. The patient was provided an opportunity to ask  questions and all were answered. The patient agreed with the plan and demonstrated an understanding of the instructions. ?  ?The patient was advised to call back or seek an in-person evaluation if the symptoms worsen or if the condition fails to improve as anticipated. ? ? ? ? ?Evelena Peat, MD  ? ?

## 2021-08-19 ENCOUNTER — Ambulatory Visit: Payer: Medicaid Other | Admitting: Family Medicine

## 2021-08-29 ENCOUNTER — Telehealth: Payer: Self-pay | Admitting: Family Medicine

## 2021-08-29 NOTE — Telephone Encounter (Signed)
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 The last letter was written on 07-15-2021. Please fax to [employer] [place] medical accomodation dept ?

## 2021-08-30 NOTE — Telephone Encounter (Signed)
Pt informed of the message and verbalized understanding. F/u scheduled  

## 2021-09-12 DIAGNOSIS — F411 Generalized anxiety disorder: Secondary | ICD-10-CM | POA: Diagnosis not present

## 2021-09-13 ENCOUNTER — Ambulatory Visit: Payer: BC Managed Care – PPO | Admitting: Family Medicine

## 2021-09-23 ENCOUNTER — Ambulatory Visit (INDEPENDENT_AMBULATORY_CARE_PROVIDER_SITE_OTHER): Payer: BC Managed Care – PPO | Admitting: Family Medicine

## 2021-09-23 ENCOUNTER — Encounter: Payer: Self-pay | Admitting: Family Medicine

## 2021-09-23 VITALS — BP 150/98 | HR 92 | Temp 98.0°F | Ht 72.0 in | Wt 226.2 lb

## 2021-09-23 DIAGNOSIS — I10 Essential (primary) hypertension: Secondary | ICD-10-CM

## 2021-09-23 DIAGNOSIS — E7800 Pure hypercholesterolemia, unspecified: Secondary | ICD-10-CM | POA: Diagnosis not present

## 2021-09-23 DIAGNOSIS — E1159 Type 2 diabetes mellitus with other circulatory complications: Secondary | ICD-10-CM | POA: Diagnosis not present

## 2021-09-23 DIAGNOSIS — Z794 Long term (current) use of insulin: Secondary | ICD-10-CM | POA: Diagnosis not present

## 2021-09-23 LAB — POCT GLYCOSYLATED HEMOGLOBIN (HGB A1C): Hemoglobin A1C: 13 % — AB (ref 4.0–5.6)

## 2021-09-23 MED ORDER — DEXCOM G6 SENSOR MISC: 3 refills | Status: DC

## 2021-09-23 MED ORDER — DEXCOM G6 TRANSMITTER MISC: 3 refills | Status: DC

## 2021-09-23 MED ORDER — ATORVASTATIN CALCIUM 80 MG PO TABS: 80.0000 mg | ORAL_TABLET | Freq: Every day | ORAL | 3 refills | Status: DC

## 2021-09-23 NOTE — Patient Instructions (Signed)
Get back on the Amlodipine ? ?We are going to try to get continuous glucose monitoring system ? ?Get on the Novolog- sliding scale. If no glucose monitor yet- go ahead and start the Novolog 5 units with each meal ? ?We need to see if your insurance covers mail order and I will send in the Basaglar to mail order  ?

## 2021-09-23 NOTE — Progress Notes (Signed)
? ?Established Patient Office Visit ? ?Subjective   ?[name], male    DOB: 10-09-1975  Age: 46 y.o. MRN: [id number] ? ?Chief Complaint  ?Patient presents with  ? Follow-up  ? ? ?HPI ? ?Past Medical History:  ?Diagnosis Date  ? Median arcuate ligament syndrome (HCC)   ? ?  ?Here for medical follow-up.  We have been going in circles recently trying to get his diabetes medications.  Not sure how much of this is related to insurance versus pharmacy issues.  Kirk Barker had admission last winter for DKA.  We have been trying for some time to get Basaglar insulin as well as NovoLog and Kirk Barker finally got the NovoLog filled today.  Has not yet started.  Does not have home glucose monitor currently.  Denies any nausea or vomiting.  Appetite and weight stable.  No polyuria. ? ?His medical problems include poorly controlled type 2 diabetes, CAD with history of NSTEMI, hypertension, chronic abdominal pain, hyperlipidemia.  Kirk Barker has chronic epigastric abdominal pain has had extensive GI work-up with no clear etiology.  Kirk Barker denies any history of pancreatitis. ? ?Kirk Barker has hypertension and is supposed to be on amlodipine 10 mg daily but apparently has not been taking this for several weeks.  Also not clear if Kirk Barker is taking his carvedilol.  Kirk Barker states Kirk Barker is taking metformin and [place].  Should also be on losartan 50 mg daily ?Kirk Barker thinks Kirk Barker is taking his Lipitor.  Denies any recent chest pains. ? ?Has several questions today regarding diet.  Would like to see certified diabetes educator. ? ?Past Medical History:  ?Diagnosis Date  ? Median arcuate ligament syndrome (HCC)   ? ?Past Surgical History:  ?Procedure Laterality Date  ? CHOLECYSTECTOMY    ? LEFT HEART CATH AND CORONARY ANGIOGRAPHY N/A 09/15/2019  ? Procedure: LEFT HEART CATH AND CORONARY ANGIOGRAPHY;  Surgeon: Lyn Records, MD;  Location: [facility] INVASIVE CV LAB;  Service: Cardiovascular;  Laterality: N/A;  ? ? reports that Kirk Barker quit smoking about 2 years ago. His smoking use  included cigarettes. Kirk Barker smoked an average of .5 packs per day. Kirk Barker has never used smokeless tobacco. Kirk Barker reports current alcohol use. Kirk Barker reports that Kirk Barker does not use drugs. ?family history includes Brain cancer in his maternal grandmother; Diabetes in his father; Stroke in his mother. ?Allergies  ?Allergen Reactions  ? Bee Venom   ? Cat Hair Extract Anxiety, Itching and Other (See Comments)  ?  Itching , watering eyes  ?Itching , watering eyes  ?Itching , watering eyes  ?Itching , watering eyes  ?  ? Other Itching  ? ? ? ?Review of Systems  ?Constitutional:  Negative for chills, fever, malaise/fatigue and weight loss.  ?Respiratory:  Negative for cough.   ?Cardiovascular:  Negative for chest pain.  ?Gastrointestinal:  Negative for nausea and vomiting.  ?Genitourinary:  Negative for dysuria.  ?Endo/Heme/Allergies:  Negative for polydipsia.  ? ?  ?Objective:  ?  ? ?BP (!) 150/98 (BP Location: Left Arm, Cuff Size: Normal)   Pulse 92   Temp 98 ?F (36.7 ?C) (Oral)   Ht 6' (1.829 m)   Wt 226 lb 3.2 oz (102.6 kg)   SpO2 96%   BMI 30.68 kg/m?  ?Wt Readings from Last 3 Encounters:  ?09/23/21 226 lb 3.2 oz (102.6 kg)  ?08/15/21 226 lb (102.5 kg)  ?07/13/21 226 lb (102.5 kg)  ? ?  ? ?Physical Exam ?Vitals reviewed.  ?Constitutional:   ?  Appearance: Normal appearance.  ?Cardiovascular:  ?   Rate and Rhythm: Normal rate and regular rhythm.  ?Pulmonary:  ?   Effort: Pulmonary effort is normal.  ?   Breath sounds: Normal breath sounds.  ?Musculoskeletal:  ?   Cervical back: Neck supple.  ?   Right lower leg: No edema.  ?   Left lower leg: No edema.  ?Lymphadenopathy:  ?   Cervical: No cervical adenopathy.  ?Neurological:  ?   Mental Status: Kirk Barker is alert.  ? ? ? ?Results for orders placed or performed in visit on 09/23/21  ?POCT glycosylated hemoglobin (Hb A1C)  ?Result Value Ref Range  ? Hemoglobin A1C 13.0 (A) 4.0 - 5.6 %  ? HbA1c POC (<> result, manual entry)    ? HbA1c, POC (prediabetic range)    ? HbA1c, POC (controlled  diabetic range)    ? ? ?Last metabolic panel ?Lab Results  ?Component Value Date  ? GLUCOSE 356 (H) 06/21/2021  ? NA 129 (L) 06/21/2021  ? K 4.3 06/21/2021  ? CL 99 06/21/2021  ? CO2 24 06/21/2021  ? BUN 12 06/21/2021  ? CREATININE 1.12 06/21/2021  ? GFRNONAA >60 06/12/2021  ? CALCIUM 9.6 06/21/2021  ? PHOS 2.9 06/10/2021  ? PROT 6.0 (L) 06/12/2021  ? ALBUMIN 2.8 (L) 06/12/2021  ? BILITOT 0.4 06/12/2021  ? ALKPHOS 93 06/12/2021  ? AST 30 06/12/2021  ? ALT 51 (H) 06/12/2021  ? ANIONGAP 9 06/12/2021  ? ?Last hemoglobin A1c ?Lab Results  ?Component Value Date  ? HGBA1C 13.0 (A) 09/23/2021  ? ?  ? ?The ASCVD Risk score (Arnett DK, et al., 2019) failed to calculate for the following reasons: ?  The patient has a prior MI or stroke diagnosis ? ?  ?Assessment & Plan:  ? ?#1 type 2 diabetes.  Very poor control.  Poor compliance with medication. ? ?-Continue metformin and [place] ?-Kirk Barker finally got NovoLog filled today and has sliding scale to take 3 times daily ?-We discussed glucose monitoring and would like to see if we can get Dexcom system ordered. ?-Set up further education with CDE ?-Needs basal insulin.  Should be on Basaglar 30 units daily.  There is some kind of glitch with insurance.  We have asked to see if Kirk Barker has a mail order option with his insurance and if so we will get back with [place] and we will send off to mail order ? ?#2 hypertension poorly controlled.  Patient for some reason has not been taking his amlodipine and not sure if Kirk Barker is taking carvedilol.  We have gone over his medicines to make sure Kirk Barker is taking all his blood pressure medications and especially getting back on the amlodipine. ?-Reassess blood pressure within 1 month ? ?#3 dyslipidemia.  Goal LDL less than 55.  Recheck lipids at follow-up.  Confirm that Kirk Barker is taking lovastatin 80 mg daily ? ? ?Return in about 1 month (around 10/23/2021).  ? ? ?Evelena Peat, MD ? ?

## 2021-10-04 ENCOUNTER — Encounter: Payer: BC Managed Care – PPO | Attending: Family Medicine | Admitting: *Deleted

## 2021-10-19 ENCOUNTER — Other Ambulatory Visit: Payer: Self-pay

## 2021-10-19 ENCOUNTER — Encounter: Payer: Self-pay | Admitting: Family Medicine

## 2021-10-19 ENCOUNTER — Telehealth (INDEPENDENT_AMBULATORY_CARE_PROVIDER_SITE_OTHER): Payer: BC Managed Care – PPO | Admitting: Family Medicine

## 2021-10-19 VITALS — Ht 72.0 in | Wt 226.0 lb

## 2021-10-19 DIAGNOSIS — I10 Essential (primary) hypertension: Secondary | ICD-10-CM

## 2021-10-19 DIAGNOSIS — I252 Old myocardial infarction: Secondary | ICD-10-CM

## 2021-10-19 DIAGNOSIS — Z794 Long term (current) use of insulin: Secondary | ICD-10-CM | POA: Diagnosis not present

## 2021-10-19 DIAGNOSIS — E1159 Type 2 diabetes mellitus with other circulatory complications: Secondary | ICD-10-CM

## 2021-10-19 MED ORDER — BASAGLAR KWIKPEN 100 UNIT/ML ~~LOC~~ SOPN: 20.0000 [IU] | PEN_INJECTOR | Freq: Every day | SUBCUTANEOUS | 11 refills | Status: DC

## 2021-10-19 NOTE — Progress Notes (Signed)
[name], male   DOB: Aug 17, 1975, 46 y.o.   MRN: [id number]   Virtual Visit via Video Note  I connected with Kirk Barker on 10/19/21 at  3:15 PM EDT by a video enabled telemedicine application and verified that I am speaking with the correct person using two identifiers.  Location patient: home Location provider:work or home office Persons participating in the virtual visit: patient, provider  I discussed the limitations of evaluation and management by telemedicine and the availability of in person appointments. The patient expressed understanding and agreed to proceed.   HPI: Kirk Barker has history of type 2 diabetes, CAD, hypertension, pancreatitis, dyslipidemia.  Had admission several months ago with diabetic ketoacidosis.  He is finally back on NovoLog along with [place] and metformin.  His blood sugars are improving.  We were able to get Dexcom continuous glucose monitoring system.  His fasting sugars are frequently low 100s.  Feels better overall.  We have had a tremendous difficulty getting his basal insulin with Basaglar filled because of insurance and pharmacy issues.  He has hypertension and is currently back on amlodipine.  Last visit was not taking this.  Is on amlodipine and losartan.  Also takes carvedilol 25 mg twice daily.  No headaches.  No chest pains.  On high-dose statin with atorvastatin 80 mg daily.  He has been working from home basically since his admission.  He is requesting another 90-day extension to work from home until his blood sugars are more stabilized.   ROS: See pertinent positives and negatives per HPI.  Past Medical History:  Diagnosis Date   Median arcuate ligament syndrome [facility])     Past Surgical History:  Procedure Laterality Date   CHOLECYSTECTOMY     LEFT HEART CATH AND CORONARY ANGIOGRAPHY N/A 09/15/2019   Procedure: LEFT HEART CATH AND CORONARY ANGIOGRAPHY;  Surgeon: Lyn Records, MD;  Location: [facility];  Service:  Cardiovascular;  Laterality: N/A;    Family History  Problem Relation Age of Onset   Stroke Mother    Diabetes Father    Brain cancer Maternal Grandmother     SOCIAL HX: Former smoker   Current Outpatient Medications:    amLODipine (NORVASC) 10 MG tablet, Take 1 tablet (10 mg total) by mouth daily., Disp: 90 tablet, Rfl: 2   atorvastatin (LIPITOR) 80 MG tablet, Take 1 tablet (80 mg total) by mouth daily at 6 PM., Disp: 90 tablet, Rfl: 3   carvedilol (COREG) 25 MG tablet, Take 1 tablet (25 mg total) by mouth 2 (two) times daily with a meal., Disp: 180 tablet, Rfl: 3   Continuous Blood Gluc Sensor (DEXCOM G6 SENSOR) MISC, One device as instructed, Disp: 3 each, Rfl: 3   Continuous Blood Gluc Transmit (DEXCOM G6 TRANSMITTER) MISC, One transmitter every 10 days, Disp: 1 each, Rfl: 3   dapagliflozin propanediol (FARXIGA) 10 MG TABS tablet, Take 1 tablet (10 mg total) by mouth daily before breakfast., Disp: 90 tablet, Rfl: 3   insulin aspart (NOVOLOG) 100 UNIT/ML injection, CBG 70 - 120: 0 units  CBG 121 - 150: 1 unit  CBG 151 - 200: 2 units  CBG 201 - 250: 3 units  CBG 251 - 300: 5 units  CBG 301 - 350: 7 units  CBG 351 - 400: 9 units, Disp: 10 mL, Rfl: 11   Insulin Glargine w/ Trans Port (BASAGLAR TEMPO PEN) 100 UNIT/ML SOPN, Inject 30 Units into the skin daily. Inject 30 units into the skin  daily. (Patient taking differently: Inject 10 Units into the skin daily. Inject 10 units into the skin daily.), Disp: 3 mL, Rfl: 3   losartan (COZAAR) 50 MG tablet, Take 1 tablet (50 mg total) by mouth daily., Disp: 90 tablet, Rfl: 3   metFORMIN (GLUCOPHAGE) 500 MG tablet, Take 2 tablets (1,000 mg total) by mouth 2 (two) times daily., Disp: 360 tablet, Rfl: 3   nitroGLYCERIN (NITROSTAT) 0.4 MG SL tablet, Place 1 tablet (0.4 mg total) under the tongue every 5 (five) minutes as needed for chest pain., Disp: 25 tablet, Rfl: 1   ondansetron (ZOFRAN-ODT) 8 MG disintegrating tablet, Take 1 tablet (8 mg total) by  mouth every 8 (eight) hours as needed for nausea or vomiting., Disp: 15 tablet, Rfl: 0   pantoprazole (PROTONIX) 40 MG tablet, Take 1 tablet (40 mg total) by mouth daily., Disp: 90 tablet, Rfl: 3  EXAM:  VITALS per patient if applicable:  GENERAL: alert, oriented, appears well and in no acute distress  HEENT: atraumatic, conjunttiva clear, no obvious abnormalities on inspection of external nose and ears  NECK: normal movements of the head and neck  LUNGS: on inspection no signs of respiratory distress, breathing rate appears normal, no obvious gross SOB, gasping or wheezing  CV: no obvious cyanosis  MS: moves all visible extremities without noticeable abnormality  PSYCH/NEURO: pleasant and cooperative, no obvious depression or anxiety, speech and thought processing grossly intact  ASSESSMENT AND PLAN:  Discussed the following assessment and plan:  #1 type 2 diabetes.  Improving by home readings with Dexcom monitor.  Last A1c was very high.  Needs office follow-up within a couple months to reassess A1c.  We are still trying to get his Basaglar covered.  #2 hypertension.  Patient back on carvedilol, amlodipine, and losartan.  Keep sodium intake less than 2500 mg daily.  #3 history of CAD.  Goal LDL less than 55.  Continue high-dose statin.  Needs follow-up lipid panel soon with next office visit.     I discussed the assessment and treatment plan with the patient. The patient was provided an opportunity to ask questions and all were answered. The patient agreed with the plan and demonstrated an understanding of the instructions.   The patient was advised to call back or seek an in-person evaluation if the symptoms worsen or if the condition fails to improve as anticipated.     Carolann Littler, MD

## 2021-10-19 NOTE — Telephone Encounter (Signed)
I spoke with the pt's pharmacy and they stated that Insuline Glargine (Basaglar Tempo) Is not covered by the pt's insurance. Pharmacist stated if regular Basaglar was sent then it would be covered by insurance

## 2021-11-04 ENCOUNTER — Telehealth: Payer: BC Managed Care – PPO | Admitting: Family Medicine

## 2021-11-15 ENCOUNTER — Encounter: Payer: Self-pay | Admitting: Family Medicine

## 2021-12-19 DIAGNOSIS — F411 Generalized anxiety disorder: Secondary | ICD-10-CM | POA: Diagnosis not present

## 2021-12-28 DIAGNOSIS — F411 Generalized anxiety disorder: Secondary | ICD-10-CM | POA: Diagnosis not present

## 2022-01-04 ENCOUNTER — Ambulatory Visit: Payer: BC Managed Care – PPO | Admitting: Family Medicine

## 2022-01-04 DIAGNOSIS — L03116 Cellulitis of left lower limb: Secondary | ICD-10-CM | POA: Diagnosis not present

## 2022-01-06 ENCOUNTER — Ambulatory Visit (INDEPENDENT_AMBULATORY_CARE_PROVIDER_SITE_OTHER): Payer: BC Managed Care – PPO | Admitting: Family Medicine

## 2022-01-06 VITALS — BP 152/100 | HR 90 | Temp 98.2°F | Ht 72.0 in | Wt 213.6 lb

## 2022-01-06 DIAGNOSIS — Z794 Long term (current) use of insulin: Secondary | ICD-10-CM | POA: Diagnosis not present

## 2022-01-06 DIAGNOSIS — E1159 Type 2 diabetes mellitus with other circulatory complications: Secondary | ICD-10-CM | POA: Diagnosis not present

## 2022-01-06 DIAGNOSIS — E7800 Pure hypercholesterolemia, unspecified: Secondary | ICD-10-CM

## 2022-01-06 DIAGNOSIS — I10 Essential (primary) hypertension: Secondary | ICD-10-CM | POA: Diagnosis not present

## 2022-01-06 LAB — POCT GLYCOSYLATED HEMOGLOBIN (HGB A1C): Hemoglobin A1C: 10.7 % — AB (ref 4.0–5.6)

## 2022-01-06 MED ORDER — DEXCOM G6 TRANSMITTER MISC
3 refills | Status: DC
Start: 2022-01-06 — End: 2023-10-19

## 2022-01-06 MED ORDER — LOSARTAN POTASSIUM-HCTZ 100-12.5 MG PO TABS: 1.0000 | ORAL_TABLET | Freq: Every day | ORAL | 3 refills | Status: DC

## 2022-01-06 NOTE — Progress Notes (Signed)
Established Patient Office Visit  Subjective   [name], male    DOB: September 21, 1975  Age: 46 y.o. MRN: [id number]  Chief Complaint  Patient presents with   Follow-up    HPI   [name] has history of CAD, type 2 diabetes, hypertension, past history of reported pancreatitis, GERD, hyperlipidemia.  His major complaint is ongoing epigastric pain which has been chronic for years.  He is seen multiple gastroenterologist.  He has pending follow-up with GI.  Has daily epigastric pain which is unchanged.  His weight is down about 13 pounds from last visit but he attributes this to dietary changes.  He and his wife made some diet changes with reducing meats and more vegetable and fish based diet.  Very poorly controlled diabetes.  When he was in the hospital recently had 14.2 A1c and has improved down to 10.7% today.  He remains on metformin, Farxiga, Basaglar, and NovoLog 3 times daily with meals.  Currently on 20 units of Basaglar.  He has Dexcom meter but does not have any readings today to review.  Currently using 5 units of NovoLog 3 times daily with meals.  No hypoglycemic symptoms.  Blood pressure currently treated with amlodipine, carvedilol, and losartan.  He thinks his blood pressures partly elevated today because of his pain.  We have not seen great control his blood pressure since he got out of hospital months ago.  Past Medical History:  Diagnosis Date   Median arcuate ligament syndrome [facility])    Past Surgical History:  Procedure Laterality Date   CHOLECYSTECTOMY     LEFT HEART CATH AND CORONARY ANGIOGRAPHY N/A 09/15/2019   Procedure: LEFT HEART CATH AND CORONARY ANGIOGRAPHY;  Surgeon: Lyn Records, MD;  Location: [facility];  Service: Cardiovascular;  Laterality: N/A;    reports that he quit smoking about 2 years ago. His smoking use included cigarettes. He smoked an average of .5 packs per day. He has never used smokeless tobacco. He reports current alcohol  use. He reports that he does not use drugs. family history includes Brain cancer in his maternal grandmother; Diabetes in his father; Stroke in his mother. Allergies  Allergen Reactions   Bee Venom    Cat Hair Extract Anxiety, Itching and Other (See Comments)    Itching , watering eyes  Itching , watering eyes  Itching , watering eyes  Itching , watering eyes     Other Itching    Review of Systems  Constitutional:  Negative for malaise/fatigue.  Eyes:  Negative for blurred vision.  Respiratory:  Negative for shortness of breath.   Cardiovascular:  Negative for chest pain.  Gastrointestinal:  Positive for abdominal pain. Negative for blood in stool, melena, nausea and vomiting.  Neurological:  Negative for dizziness, weakness and headaches.      Objective:     BP (!) 152/100 (BP Location: Left Arm, Patient Position: Sitting, Cuff Size: Normal)   Pulse 90   Temp 98.2 F (36.8 C) (Oral)   Ht 6' (1.829 m)   Wt 213 lb 9.6 oz (96.9 kg)   SpO2 100%   BMI 28.97 kg/m    Physical Exam Constitutional:      Appearance: He is well-developed.  HENT:     Right Ear: External ear normal.     Left Ear: External ear normal.  Eyes:     Pupils: Pupils are equal, round, and reactive to light.  Neck:     Thyroid: No thyromegaly.  Cardiovascular:     Rate and Rhythm: Normal rate and regular rhythm.  Pulmonary:     Effort: Pulmonary effort is normal. No respiratory distress.     Breath sounds: Normal breath sounds. No wheezing or rales.  Musculoskeletal:     Cervical back: Neck supple.     Right lower leg: No edema.     Left lower leg: No edema.  Neurological:     Mental Status: He is alert and oriented to person, place, and time.      Results for orders placed or performed in visit on 01/06/22  POCT glycosylated hemoglobin (Hb A1C)  Result Value Ref Range   Hemoglobin A1C 10.7 (A) 4.0 - 5.6 %   HbA1c POC (<> result, manual entry)     HbA1c, POC (prediabetic range)      HbA1c, POC (controlled diabetic range)        The ASCVD Risk score (Arnett DK, et al., 2019) failed to calculate for the following reasons:   The patient has a prior MI or stroke diagnosis    Assessment & Plan:   #1 type 2 diabetes improving but still poorly controlled with A1c 10.7%.  He does have Dexcom now for closer monitoring.  We discussed the following  -Increase Basaglar from 20 to 26 units once daily. -Monitor fasting sugars daily and titrate Basaglar 2 units every 3 days until fasting sugars consistently less than 130 -Continue NovoLog 5 units 3 times daily with meals currently but we may need to adjust this as well  #2 hypertension poorly controlled.  Change losartan 50 mg to losartan HCTZ 100/12.5 mg.  Continue amlodipine 10 mg daily and also continue carvedilol 25 mg twice daily.  Follow low-sodium diet. Set up follow-up to reassess  #3 dyslipidemia.  Goal LDL less than 55.  Patient on high-dose atorvastatin.  Recheck lipid and hepatic panel today   Return in about 3 months (around 04/08/2022).    Evelena Peat, MD

## 2022-01-06 NOTE — Patient Instructions (Signed)
STOP the plain Losartan and START Losartan- HCTZ one daily (for BP)  INCREASE Basaglar to 26 units once daily.   Check fasting sugars daily and increase Basaglar insulin 2 units every 3 days until fasting sugars are consistently < 130.

## 2022-01-07 LAB — BASIC METABOLIC PANEL
BUN: 12 mg/dL (ref 7–25)
CO2: 27 mmol/L (ref 20–32)
Calcium: 9.7 mg/dL (ref 8.6–10.3)
Chloride: 101 mmol/L (ref 98–110)
Creat: 1.28 mg/dL (ref 0.60–1.29)
Glucose, Bld: 243 mg/dL — ABNORMAL HIGH (ref 65–99)
Potassium: 4.5 mmol/L (ref 3.5–5.3)
Sodium: 135 mmol/L (ref 135–146)

## 2022-01-07 LAB — HEPATIC FUNCTION PANEL
AG Ratio: 1.3 (calc) (ref 1.0–2.5)
ALT: 15 U/L (ref 9–46)
AST: 13 U/L (ref 10–40)
Albumin: 4.3 g/dL (ref 3.6–5.1)
Alkaline phosphatase (APISO): 100 U/L (ref 36–130)
Bilirubin, Direct: 0.1 mg/dL (ref 0.0–0.2)
Globulin: 3.3 g/dL (calc) (ref 1.9–3.7)
Indirect Bilirubin: 0.4 mg/dL (calc) (ref 0.2–1.2)
Total Bilirubin: 0.5 mg/dL (ref 0.2–1.2)
Total Protein: 7.6 g/dL (ref 6.1–8.1)

## 2022-01-07 LAB — LIPID PANEL
Cholesterol: 184 mg/dL (ref <200)
HDL: 41 mg/dL (ref >=40)
LDL Cholesterol (Calc): 123 mg/dL (calc) — ABNORMAL HIGH
Non-HDL Cholesterol (Calc): 143 mg/dL (calc) — ABNORMAL HIGH (ref <130)
Total CHOL/HDL Ratio: 4.5 (calc) (ref <5.0)
Triglycerides: 97 mg/dL (ref <150)

## 2022-01-09 MED ORDER — EZETIMIBE 10 MG PO TABS: 10.0000 mg | ORAL_TABLET | Freq: Every day | ORAL | 3 refills | Status: DC

## 2022-01-09 NOTE — Addendum Note (Signed)
Addended by: Christy Sartorius on: 01/09/2022 01:43 PM   Modules accepted: Orders

## 2022-01-16 DIAGNOSIS — F411 Generalized anxiety disorder: Secondary | ICD-10-CM | POA: Diagnosis not present

## 2022-01-24 DIAGNOSIS — F411 Generalized anxiety disorder: Secondary | ICD-10-CM | POA: Diagnosis not present

## 2022-02-07 DIAGNOSIS — F411 Generalized anxiety disorder: Secondary | ICD-10-CM | POA: Diagnosis not present

## 2022-02-13 ENCOUNTER — Ambulatory Visit (HOSPITAL_BASED_OUTPATIENT_CLINIC_OR_DEPARTMENT_OTHER): Payer: BC Managed Care – PPO | Admitting: Cardiology

## 2022-02-20 DIAGNOSIS — F411 Generalized anxiety disorder: Secondary | ICD-10-CM | POA: Diagnosis not present

## 2022-02-27 DIAGNOSIS — F411 Generalized anxiety disorder: Secondary | ICD-10-CM | POA: Diagnosis not present

## 2022-03-15 ENCOUNTER — Other Ambulatory Visit: Payer: Self-pay | Admitting: Family Medicine

## 2022-03-16 DIAGNOSIS — F411 Generalized anxiety disorder: Secondary | ICD-10-CM | POA: Diagnosis not present

## 2022-03-25 ENCOUNTER — Other Ambulatory Visit: Payer: Self-pay

## 2022-03-25 ENCOUNTER — Emergency Department (HOSPITAL_BASED_OUTPATIENT_CLINIC_OR_DEPARTMENT_OTHER): Payer: BC Managed Care – PPO

## 2022-03-25 ENCOUNTER — Encounter (HOSPITAL_BASED_OUTPATIENT_CLINIC_OR_DEPARTMENT_OTHER): Payer: Self-pay

## 2022-03-25 ENCOUNTER — Emergency Department (HOSPITAL_BASED_OUTPATIENT_CLINIC_OR_DEPARTMENT_OTHER)
Admission: EM | Admit: 2022-03-25 | Discharge: 2022-03-25 | Disposition: A | Discharge status: Home / Self Care | Payer: BC Managed Care – PPO | Attending: Emergency Medicine | Admitting: Emergency Medicine

## 2022-03-25 DIAGNOSIS — L02214 Cutaneous abscess of groin: Secondary | ICD-10-CM | POA: Insufficient documentation

## 2022-03-25 DIAGNOSIS — Z7984 Long term (current) use of oral hypoglycemic drugs: Secondary | ICD-10-CM | POA: Insufficient documentation

## 2022-03-25 DIAGNOSIS — Z794 Long term (current) use of insulin: Secondary | ICD-10-CM | POA: Diagnosis not present

## 2022-03-25 DIAGNOSIS — L02211 Cutaneous abscess of abdominal wall: Secondary | ICD-10-CM | POA: Diagnosis not present

## 2022-03-25 DIAGNOSIS — Z79899 Other long term (current) drug therapy: Secondary | ICD-10-CM | POA: Diagnosis not present

## 2022-03-25 DIAGNOSIS — I700 Atherosclerosis of aorta: Secondary | ICD-10-CM | POA: Diagnosis not present

## 2022-03-25 DIAGNOSIS — E119 Type 2 diabetes mellitus without complications: Secondary | ICD-10-CM | POA: Diagnosis not present

## 2022-03-25 DIAGNOSIS — E871 Hypo-osmolality and hyponatremia: Secondary | ICD-10-CM | POA: Diagnosis not present

## 2022-03-25 DIAGNOSIS — L0291 Cutaneous abscess, unspecified: Secondary | ICD-10-CM

## 2022-03-25 DIAGNOSIS — R109 Unspecified abdominal pain: Secondary | ICD-10-CM | POA: Diagnosis not present

## 2022-03-25 LAB — CBC WITH DIFFERENTIAL/PLATELET
Abs Immature Granulocytes: 0.04 10*3/uL (ref 0.00–0.07)
Basophils Absolute: 0 10*3/uL (ref 0.0–0.1)
Basophils Relative: 0 %
Eosinophils Absolute: 0.1 10*3/uL (ref 0.0–0.5)
Eosinophils Relative: 1 %
HCT: 42 % (ref 39.0–52.0)
Hemoglobin: 14.6 g/dL (ref 13.0–17.0)
Immature Granulocytes: 0 %
Lymphocytes Relative: 13 %
Lymphs Abs: 1.2 10*3/uL (ref 0.7–4.0)
MCH: 31.5 pg (ref 26.0–34.0)
MCHC: 34.8 g/dL (ref 30.0–36.0)
MCV: 90.7 fL (ref 80.0–100.0)
Monocytes Absolute: 1.1 10*3/uL — ABNORMAL HIGH (ref 0.1–1.0)
Monocytes Relative: 11 %
Neutro Abs: 7.1 10*3/uL (ref 1.7–7.7)
Neutrophils Relative %: 75 %
Platelets: 305 10*3/uL (ref 150–400)
RBC: 4.63 MIL/uL (ref 4.22–5.81)
RDW: 12.7 % (ref 11.5–15.5)
WBC: 9.5 10*3/uL (ref 4.0–10.5)
nRBC: 0 % (ref 0.0–0.2)

## 2022-03-25 LAB — COMPREHENSIVE METABOLIC PANEL
ALT: 55 U/L — ABNORMAL HIGH (ref 0–44)
AST: 36 U/L (ref 15–41)
Albumin: 4.3 g/dL (ref 3.5–5.0)
Alkaline Phosphatase: 97 U/L (ref 38–126)
Anion gap: 11 (ref 5–15)
BUN: 12 mg/dL (ref 6–20)
CO2: 24 mmol/L (ref 22–32)
Calcium: 10.1 mg/dL (ref 8.9–10.3)
Chloride: 97 mmol/L — ABNORMAL LOW (ref 98–111)
Creatinine, Ser: 1.37 mg/dL — ABNORMAL HIGH (ref 0.61–1.24)
GFR, Estimated: >60 mL/min (ref >60)
Glucose, Bld: 237 mg/dL — ABNORMAL HIGH (ref 70–99)
Potassium: 4.1 mmol/L (ref 3.5–5.1)
Sodium: 132 mmol/L — ABNORMAL LOW (ref 135–145)
Total Bilirubin: 0.6 mg/dL (ref 0.3–1.2)
Total Protein: 8.9 g/dL — ABNORMAL HIGH (ref 6.5–8.1)

## 2022-03-25 LAB — PROTIME-INR
INR: 1 (ref 0.8–1.2)
Prothrombin Time: 13.3 seconds (ref 11.4–15.2)

## 2022-03-25 LAB — LACTIC ACID, PLASMA: Lactic Acid, Venous: 1.5 mmol/L (ref 0.5–1.9)

## 2022-03-25 MED ORDER — HYDROMORPHONE HCL 1 MG/ML IJ SOLN
1.0000 mg | Freq: Once | INTRAMUSCULAR | Status: AC
  Administered 2022-03-25: 1 mg via INTRAVENOUS
  Filled 2022-03-25: qty 1

## 2022-03-25 MED ORDER — SODIUM CHLORIDE 0.9 % IV BOLUS
500.0000 mL | Freq: Once | INTRAVENOUS | Status: AC
  Administered 2022-03-25: 500 mL via INTRAVENOUS

## 2022-03-25 MED ORDER — IOHEXOL 300 MG/ML  SOLN
100.0000 mL | Freq: Once | INTRAMUSCULAR | Status: AC | PRN
  Administered 2022-03-25: 100 mL via INTRAVENOUS

## 2022-03-25 MED ORDER — LIDOCAINE-EPINEPHRINE 2 %-1:100000 IJ SOLN
20.0000 mL | Freq: Once | INTRAMUSCULAR | Status: AC
  Administered 2022-03-25: 20 mL via INTRADERMAL

## 2022-03-25 MED ORDER — LIDOCAINE HCL 1 % IJ SOLN
INTRAMUSCULAR | Status: AC
  Filled 2022-03-25: qty 20

## 2022-03-25 MED ORDER — HYDROMORPHONE HCL 1 MG/ML IJ SOLN
0.5000 mg | Freq: Once | INTRAMUSCULAR | Status: AC
  Administered 2022-03-25: 0.5 mg via INTRAVENOUS
  Filled 2022-03-25: qty 1

## 2022-03-25 MED ORDER — CLINDAMYCIN HCL 150 MG PO CAPS
450.0000 mg | ORAL_CAPSULE | Freq: Once | ORAL | Status: AC
  Administered 2022-03-25: 450 mg via ORAL
  Filled 2022-03-25: qty 3

## 2022-03-25 MED ORDER — CLINDAMYCIN HCL 300 MG PO CAPS: 600.0000 mg | ORAL_CAPSULE | Freq: Two times a day (BID) | ORAL | 0 refills | Status: AC

## 2022-03-25 NOTE — ED Provider Notes (Signed)
[facility] EMERGENCY DEPT Provider Note   CSN: [id number] Arrival date & time: 03/25/22  1759     History {Add pertinent medical, surgical, social history, OB history to HPI:1} Chief Complaint  Patient presents with   Abscess    KIZER NOBBE is a 46 y.o. male with a past medical history of diabetes, ACS, DKA, AKI presenting to the emergency department for evaluation of an abscess.  States he noticed an abscess on his pubic area yesterday.  Today the abscess has doubled in size.  Patient has significant pain on the abscess and area around with increased redness.  Patient noticed clear drainage from the abscess yesterday.  Denies fever, chest pain, shortness of breath, nausea, vomiting, bowel changes, urinary symptoms, rash.   Abscess      Home Medications Prior to Admission medications   Medication Sig Start Date End Date Taking? Authorizing Provider  amLODipine (NORVASC) 10 MG tablet Take 1 tablet (10 mg total) by mouth daily. 06/13/21   Kathlen Mody, MD  atorvastatin (LIPITOR) 80 MG tablet Take 1 tablet (80 mg total) by mouth daily at 6 PM. 09/23/21   Burchette, Elberta Fortis, MD  carvedilol (COREG) 25 MG tablet Take 1 tablet (25 mg total) by mouth 2 (two) times daily with a meal. 06/12/21   Kathlen Mody, MD  Continuous Blood Gluc Sensor (DEXCOM G6 SENSOR) MISC One device as instructed 09/23/21   Burchette, Elberta Fortis, MD  Continuous Blood Gluc Transmit (DEXCOM G6 TRANSMITTER) MISC One transmitter every 10 days 01/06/22   Burchette, Elberta Fortis, MD  dapagliflozin propanediol (FARXIGA) 10 MG TABS tablet Take 1 tablet (10 mg total) by mouth daily before breakfast. 06/12/21   Kathlen Mody, MD  ezetimibe (ZETIA) 10 MG tablet Take 1 tablet (10 mg total) by mouth daily. 01/09/22   Burchette, Elberta Fortis, MD  insulin aspart (NOVOLOG) 100 UNIT/ML injection CBG 70 - 120: 0 units  CBG 121 - 150: 1 unit  CBG 151 - 200: 2 units  CBG 201 - 250: 3 units  CBG 251 - 300: 5 units  CBG 301 - 350: 7  units  CBG 351 - 400: 9 units 07/15/21   Burchette, Elberta Fortis, MD  Insulin Glargine (BASAGLAR KWIKPEN) 100 UNIT/ML Inject 20 Units into the skin daily. 10/19/21   Burchette, Elberta Fortis, MD  Insulin Glargine w/ Trans Port (BASAGLAR TEMPO PEN) 100 UNIT/ML SOPN Inject 30 Units into the skin daily. Inject 30 units into the skin daily. Patient taking differently: Inject 10 Units into the skin daily. Inject 10 units into the skin daily. 07/15/21   Burchette, Elberta Fortis, MD  losartan-hydrochlorothiazide (HYZAAR) 100-12.5 MG tablet Take 1 tablet by mouth daily. 01/06/22   Burchette, Elberta Fortis, MD  metFORMIN (GLUCOPHAGE) 500 MG tablet Take 2 tablets (1,000 mg total) by mouth 2 (two) times daily. 06/12/21   Kathlen Mody, MD  nitroGLYCERIN (NITROSTAT) 0.4 MG SL tablet Place 1 tablet (0.4 mg total) under the tongue every 5 (five) minutes as needed for chest pain. 06/12/21   Kathlen Mody, MD  ondansetron (ZOFRAN-ODT) 8 MG disintegrating tablet Take 1 tablet (8 mg total) by mouth every 8 (eight) hours as needed for nausea or vomiting. 06/21/21   Burchette, Elberta Fortis, MD  pantoprazole (PROTONIX) 40 MG tablet Take 1 tablet (40 mg total) by mouth daily. 06/12/21   Kathlen Mody, MD      Allergies    Bee venom, Cat hair extract, and Other    Review of Systems  Review of Systems  Skin:        Abscess.    Physical Exam Updated Vital Signs BP (!) 154/91   Pulse (!) 118   Temp 100.1 F (37.8 C) (Oral)   Resp 19   Ht 6' (1.829 m)   Wt 96.9 kg   SpO2 100%   BMI 28.97 kg/m  Physical Exam Vitals and nursing note reviewed.  Constitutional:      Appearance: He is ill-appearing.  HENT:     Head: Normocephalic and atraumatic.     Mouth/Throat:     Mouth: Mucous membranes are moist.  Eyes:     General: No scleral icterus. Cardiovascular:     Rate and Rhythm: Normal rate and regular rhythm.     Pulses: Normal pulses.     Heart sounds: Normal heart sounds.  Pulmonary:     Effort: Pulmonary effort is normal.      Breath sounds: Normal breath sounds.  Abdominal:     General: Abdomen is flat.     Palpations: Abdomen is soft.     Tenderness: There is no abdominal tenderness.  Musculoskeletal:        General: No deformity.  Skin:    General: Skin is warm.     Findings: No rash.     Comments: An abscess measure 5 x 4 cm on the pubic area with minimal drainage.  Significant tenderness to touch..  Neurological:     General: No focal deficit present.     Mental Status: He is alert.  Psychiatric:        Mood and Affect: Mood normal.      ED Results / Procedures / Treatments   Labs (all labs ordered are listed, but only abnormal results are displayed) Labs Reviewed  CBC WITH DIFFERENTIAL/PLATELET - Abnormal; Notable for the following components:      Result Value   Monocytes Absolute 1.1 (*)    All other components within normal limits  CULTURE, BLOOD (ROUTINE X 2)  CULTURE, BLOOD (ROUTINE X 2)  LACTIC ACID, PLASMA  PROTIME-INR  COMPREHENSIVE METABOLIC PANEL  LACTIC ACID, PLASMA    EKG None  Radiology No results found.  Procedures Procedures  {Document cardiac monitor, telemetry assessment procedure when appropriate:1}  Medications Ordered in ED Medications  HYDROmorphone (DILAUDID) injection 1 mg (has no administration in time range)    ED Course/ Medical Decision Making/ A&P                           Medical Decision Making Amount and/or Complexity of Data Reviewed Labs: ordered.  Risk Prescription drug management.   This patient presents to the ED for concern of ***, this involves an extensive number of treatment options, and is a complaint that carries with it a high risk of complications and morbidity.  The differential diagnosis includes *** Co morbidities that complicate the patient evaluation  See HPI Additional history obtained:  Additional history obtained from EMR External records from outside source obtained and reviewed  Lab Tests:  I Ordered, and  personally interpreted labs.  The pertinent results include:   No leukocytosis noted.   No evidence of anemia.   Platelets within normal range.   No electrolyte abnormalities noted.   Renal function within normal limits.   No transaminitis noted.  UA significant for no acute abnormalities. *** Imaging Studies ordered:  I ordered imaging studies including: ***  I independently visualized and interpreted imaging. I  agree with the radiologist interpretation Cardiac Monitoring: / EKG:  The patient was maintained on a cardiac monitor.  I personally viewed and interpreted the cardiac monitored which showed an underlying rhythm of: sinus rhythm Consultations Obtained:  I requested consultation with the ***,  and discussed lab and imaging findings as well as pertinent plan - they recommend: *** Problem List / ED Course / Critical interventions / Medication management  HPI: see above Vitals signs within normal range and stable throughout visit. Laboratory/imaging studies significant for: See above On physical examination, patient is afebrile and appears in no acute distress. Patient's presentations are most concerned for ***. Low suspicion for ***. I ordered medication including ***  Reevaluation of the patient after these medicines showed that the patient {resolved/improved/worsened:23923::"improved"} I have reviewed the patients home medicines and have made adjustments as needed Social Determinants of Health:  N/A Test / Admission / Dispo - Considered:  Continued outpatient therapy. Follow-up with PCP *** recommended for reevaluation of symptoms. Treatment plan discussed with patient.  Pt acknowledged understanding was agreeable to the plan. Worrisome signs and symptoms were discussed with patient, and patient acknowledged understanding to return to the ED if they noticed these signs and symptoms. Patient was stable upon discharge.    {Document critical care time when  appropriate:1} {Document review of labs and clinical decision tools ie heart score, Chads2Vasc2 etc:1}  {Document your independent review of radiology images, and any outside records:1} {Document your discussion with family members, caretakers, and with consultants:1} {Document social determinants of health affecting pt's care:1} {Document your decision making why or why not admission, treatments were needed:1} Final Clinical Impression(s) / ED Diagnoses Final diagnoses:  None    Rx / DC Orders ED Discharge Orders     None

## 2022-03-25 NOTE — Discharge Instructions (Addendum)
Please return to [facility] or Urgent Care for packing change after 2 days. Take clindamycin twice a day as prescribed. Please take tylenol/ibuprofen for pain. I recommend close follow-up with your PCP for reevaluation.  Please do not hesitate to return to emergency department if worrisome signs symptoms we discussed become apparent.

## 2022-03-25 NOTE — ED Triage Notes (Signed)
Patient here POV from Home.  Endorses Groin to Suprapubic Area for 2 Days. Worsening since it began.   No Drainage noted. Subjective Fevers.   NAD Noted during Triage. A&Ox4. GCS 15. Ambulatory.

## 2022-03-27 ENCOUNTER — Ambulatory Visit (INDEPENDENT_AMBULATORY_CARE_PROVIDER_SITE_OTHER): Payer: BC Managed Care – PPO | Admitting: Family Medicine

## 2022-03-27 ENCOUNTER — Encounter: Payer: Self-pay | Admitting: Family Medicine

## 2022-03-27 VITALS — BP 154/86 | HR 103 | Temp 97.3°F | Ht 72.0 in | Wt 214.5 lb

## 2022-03-27 DIAGNOSIS — L02211 Cutaneous abscess of abdominal wall: Secondary | ICD-10-CM

## 2022-03-27 NOTE — Progress Notes (Signed)
Established Patient Office Visit  Subjective   [name], male    DOB: 1975-11-28  Age: 46 y.o. MRN: [id number]  Chief Complaint  Patient presents with   Abcess    HPI   Seen for follow-up large abscess right lower abdomen.  He went to the ER couple days ago and this was drained with copious amount of pus.  He had packing and was placed on clindamycin.  Tolerating well with no side effects.  Still has some soreness but overall improved since the drainage.  No fevers or chills.  He does have type 2 diabetes and states his recent CBGs been relatively well controlled even with the infection.  Here today to reassess and for packing removal  Past Medical History:  Diagnosis Date   Median arcuate ligament syndrome [facility])    Past Surgical History:  Procedure Laterality Date   CHOLECYSTECTOMY     LEFT HEART CATH AND CORONARY ANGIOGRAPHY N/A 09/15/2019   Procedure: LEFT HEART CATH AND CORONARY ANGIOGRAPHY;  Surgeon: Belva Crome, MD;  Location: [facility];  Service: Cardiovascular;  Laterality: N/A;    reports that he quit smoking about 2 years ago. His smoking use included cigarettes. He smoked an average of .5 packs per day. He has never used smokeless tobacco. He reports current alcohol use. He reports that he does not use drugs. family history includes Brain cancer in his maternal grandmother; Diabetes in his father; Stroke in his mother. Allergies  Allergen Reactions   Bee Venom    Cat Hair Extract Anxiety, Itching and Other (See Comments)    Itching , watering eyes  Itching , watering eyes  Itching , watering eyes  Itching , watering eyes     Other Itching    Review of Systems  Constitutional:  Negative for chills and fever.      Objective:     BP (!) 154/86 (BP Location: Left Arm, Patient Position: Sitting, Cuff Size: Large)   Pulse (!) 103   Temp (!) 97.3 F (36.3 C) (Oral)   Ht 6' (1.829 m)   Wt 214 lb 8 oz (97.3 kg)   SpO2 99%   BMI  29.09 kg/m  BP Readings from Last 3 Encounters:  03/27/22 (!) 154/86  03/25/22 (!) 146/108  01/06/22 (!) 152/100   Wt Readings from Last 3 Encounters:  03/27/22 214 lb 8 oz (97.3 kg)  03/25/22 213 lb 10 oz (96.9 kg)  01/06/22 213 lb 9.6 oz (96.9 kg)      Physical Exam Vitals reviewed.  Constitutional:      General: He is not in acute distress.    Appearance: He is not ill-appearing.  Cardiovascular:     Rate and Rhythm: Normal rate and regular rhythm.  Abdominal:     Comments: Outer dressing removed right lower abdominal/pelvic region.  Copious drainage on outer dressing.  We removed packing and he has incision which is approximately one and 1/2 cm long.  There is significant amount of induration surrounding the wound but no fluctuance.  No significant erythema.  Minimally tender.  Neurological:     Mental Status: He is alert.      No results found for any visits on 03/27/22.    The ASCVD Risk score (Arnett DK, et al., 2019) failed to calculate for the following reasons:   The patient has a prior MI or stroke diagnosis    Assessment & Plan:   Large abscess right lower abdominal/pelvic  region.  Recent I&D.  Packing removed today.  Patient currently on clindamycin. He has a large cavity where significant amount of pus was located and this does need to be repacked.  We recommended local anesthesia to allow more complete packing and patient consented.  Anesthetized with 1% plain Xylocaine.  Using curved hemostats we packed the cavity with quarter inch iodoform gauze.  He tolerated well.  Outer dressing applied.  -Keep area dry as possible -Continue clindamycin -Follow-up in 2 days for wound recheck and packing removal.  This may need to be packed again in a couple days given the large cavity of the wound.  This does appear to be improving. -We will plan to recheck A1c at follow-up in a couple days  Return in about 2 days (around 03/29/2022).    Evelena Peat, MD

## 2022-03-27 NOTE — Patient Instructions (Signed)
Try to keep dressing dry.  Finish out the Clindamycin.    Follow up in 2 days for recheck.

## 2022-03-29 ENCOUNTER — Ambulatory Visit (INDEPENDENT_AMBULATORY_CARE_PROVIDER_SITE_OTHER): Payer: BC Managed Care – PPO | Admitting: Family Medicine

## 2022-03-29 ENCOUNTER — Encounter: Payer: Self-pay | Admitting: Family Medicine

## 2022-03-29 VITALS — BP 142/86 | HR 82 | Temp 97.9°F | Ht 72.0 in | Wt 210.2 lb

## 2022-03-29 DIAGNOSIS — L02211 Cutaneous abscess of abdominal wall: Secondary | ICD-10-CM | POA: Diagnosis not present

## 2022-03-29 NOTE — Patient Instructions (Signed)
Follow up this Friday for packing removal.  Try to keep dressing dry as possible

## 2022-03-29 NOTE — Progress Notes (Signed)
   Established Patient Office Visit  Subjective   [name], male    DOB: [birth year]  Age: 46 y.o. MRN: [id number]  Chief Complaint  Patient presents with   Follow-up    HPI   Kirk Barker is seen for follow-up right lower abdominal abscess.  Initially went to the ER and had incision and drainage and placed on clindamycin.  Still taking antibiotic.  No fever.  Gradually less pain.  Less drainage through his dressing.  Here for packing removal and reassessment today.  Past Medical History:  Diagnosis Date   Median arcuate ligament syndrome [facility])    Past Surgical History:  Procedure Laterality Date   CHOLECYSTECTOMY     LEFT HEART CATH AND CORONARY ANGIOGRAPHY N/A 09/15/2019   Procedure: LEFT HEART CATH AND CORONARY ANGIOGRAPHY;  Surgeon: Belva Crome, MD;  Location: [facility];  Service: Cardiovascular;  Laterality: N/A;    reports that he quit smoking about 2 years ago. His smoking use included cigarettes. He smoked an average of .5 packs per day. He has never used smokeless tobacco. He reports current alcohol use. He reports that he does not use drugs. family history includes Brain cancer in his maternal grandmother; Diabetes in his father; Stroke in his mother. Allergies  Allergen Reactions   Bee Venom    Cat Hair Extract Anxiety, Itching and Other (See Comments)    Itching , watering eyes  Itching , watering eyes  Itching , watering eyes  Itching , watering eyes     Other Itching    Review of Systems  Constitutional:  Negative for chills and fever.      Objective:     BP (!) 142/86 (BP Location: Right Arm, Patient Position: Sitting, Cuff Size: Large)   Pulse 82   Temp 97.9 F (36.6 C) (Oral)   Ht 6' (1.829 m)   Wt 210 lb 3.2 oz (95.3 kg)   SpO2 99%   BMI 28.51 kg/m    Physical Exam Vitals reviewed.  Constitutional:      Appearance: Normal appearance.  Cardiovascular:     Rate and Rhythm: Normal rate and regular rhythm.  Skin:     Comments: Right lower abdominal abscess examined.  Packing removed.  He has less swelling and less erythema.  No fluctuance.  Wound cavity looks good with excellent granulation tissue and no purulent drainage  We did repack the wound cavity with quarter inch iodoform gauze  Neurological:     Mental Status: He is alert.      No results found for any visits on 03/29/22.    The ASCVD Risk score (Arnett DK, et al., 2019) failed to calculate for the following reasons:   The patient has a prior MI or stroke diagnosis    Assessment & Plan:   Abscess right lower abdomen.  Pt requesting local anesthesia before packing removal and re-packing.   Local anesthesia with 1% plain xylocaine using 5/8 inch 25 gauge needle.   Packing was removed today and repacked with iodoform gauze.  He still has a fairly large cavity but this is starting to granulate in.  No fluctuance.  No purulent drainage.  No surrounding erythema.  -Return in 2 days for packing removal and reassessment.  Return in about 2 days (around 03/31/2022).    Carolann Littler, MD

## 2022-03-30 DIAGNOSIS — F411 Generalized anxiety disorder: Secondary | ICD-10-CM | POA: Diagnosis not present

## 2022-03-30 LAB — CULTURE, BLOOD (ROUTINE X 2)
Culture: NO GROWTH
Culture: NO GROWTH
Special Requests: ADEQUATE
Special Requests: ADEQUATE

## 2022-03-31 ENCOUNTER — Encounter: Payer: Self-pay | Admitting: Family Medicine

## 2022-03-31 ENCOUNTER — Ambulatory Visit (INDEPENDENT_AMBULATORY_CARE_PROVIDER_SITE_OTHER): Payer: BC Managed Care – PPO | Admitting: Family Medicine

## 2022-03-31 VITALS — BP 146/86 | HR 85 | Temp 97.6°F | Ht 72.0 in | Wt 214.9 lb

## 2022-03-31 DIAGNOSIS — L02211 Cutaneous abscess of abdominal wall: Secondary | ICD-10-CM

## 2022-03-31 NOTE — Patient Instructions (Signed)
Clean daily with soap and water  Keep covered until skin has healed over.  Watch for any increased redness or pus drainage.

## 2022-03-31 NOTE — Progress Notes (Unsigned)
   Established Patient Office Visit  Subjective   [name], male    DOB: [birth year]  Age: 46 y.o. MRN: [id number]  No chief complaint on file.   HPI  {History (Optional):23778} Kirk Barker is seen for follow-up regarding complex abscess right lower abdomen.  This was initially drained in the ER and has had a couple of subsequent follow-up here for packing.  Continues to feel better overall.  Mild burning near the surface.  No fever.  Induration going down.  Past Medical History:  Diagnosis Date   Median arcuate ligament syndrome [facility])    Past Surgical History:  Procedure Laterality Date   CHOLECYSTECTOMY     LEFT HEART CATH AND CORONARY ANGIOGRAPHY N/A 09/15/2019   Procedure: LEFT HEART CATH AND CORONARY ANGIOGRAPHY;  Surgeon: Belva Crome, MD;  Location: [place] [facility];  Service: Cardiovascular;  Laterality: N/A;    reports that he quit smoking about 2 years ago. His smoking use included cigarettes. He smoked an average of .5 packs per day. He has never used smokeless tobacco. He reports current alcohol use. He reports that he does not use drugs. family history includes Brain cancer in his maternal grandmother; Diabetes in his father; Stroke in his mother. Allergies  Allergen Reactions   Bee Venom    Cat Hair Extract Anxiety, Itching and Other (See Comments)    Itching , watering eyes  Itching , watering eyes  Itching , watering eyes  Itching , watering eyes     Other Itching    Review of Systems  Constitutional:  Negative for chills and fever.      Objective:     BP (!) 146/86 (BP Location: Left Arm, Patient Position: Sitting, Cuff Size: Large)   Pulse 85   Temp 97.6 F (36.4 C) (Oral)   Ht 6' (1.829 m)   Wt 214 lb 14.4 oz (97.5 kg)   SpO2 100%   BMI 29.15 kg/m  {Vitals History (Optional):23777}  Physical Exam Vitals reviewed.  Skin:    Comments: Packing is removed.  Wound cavity looks good.  Good granulation tissue.  No active purulent  drainage.  No surrounding erythema.  Induration slowly resolving  Neurological:     Mental Status: He is alert.      No results found for any visits on 03/31/22.  {Labs (Optional):23779}  The ASCVD Risk score (Arnett DK, et al., 2019) failed to calculate for the following reasons:   The patient has a prior MI or stroke diagnosis    Assessment & Plan:   Abscess right lower abdomen.  Improving.  Packing is removed.  We will plan to leave out packing at this point and clean daily with soap and water and keep covered until this is sealed over.  Follow-up promptly for any signs of secondary infection  Return in about 1 month (around 04/30/2022).    Carolann Littler, MD

## 2022-04-04 DIAGNOSIS — H524 Presbyopia: Secondary | ICD-10-CM | POA: Diagnosis not present

## 2022-04-04 DIAGNOSIS — E119 Type 2 diabetes mellitus without complications: Secondary | ICD-10-CM | POA: Diagnosis not present

## 2022-04-04 DIAGNOSIS — H5213 Myopia, bilateral: Secondary | ICD-10-CM | POA: Diagnosis not present

## 2022-04-04 DIAGNOSIS — H52213 Irregular astigmatism, bilateral: Secondary | ICD-10-CM | POA: Diagnosis not present

## 2022-04-04 LAB — HM DIABETES EYE EXAM

## 2022-04-06 ENCOUNTER — Encounter: Payer: Self-pay | Admitting: Family Medicine

## 2022-04-17 DIAGNOSIS — F411 Generalized anxiety disorder: Secondary | ICD-10-CM | POA: Diagnosis not present

## 2022-04-28 ENCOUNTER — Ambulatory Visit: Payer: BC Managed Care – PPO | Admitting: Family Medicine

## 2022-05-01 DIAGNOSIS — F411 Generalized anxiety disorder: Secondary | ICD-10-CM | POA: Diagnosis not present

## 2022-05-05 ENCOUNTER — Ambulatory Visit: Payer: BC Managed Care – PPO | Admitting: Family Medicine

## 2022-05-18 DIAGNOSIS — F411 Generalized anxiety disorder: Secondary | ICD-10-CM | POA: Diagnosis not present

## 2022-06-04 ENCOUNTER — Other Ambulatory Visit: Payer: Self-pay | Admitting: Family Medicine

## 2022-06-05 NOTE — Telephone Encounter (Signed)
Rx sent 

## 2022-06-09 ENCOUNTER — Other Ambulatory Visit: Payer: Self-pay | Admitting: Family Medicine

## 2022-06-20 DIAGNOSIS — L2089 Other atopic dermatitis: Secondary | ICD-10-CM | POA: Diagnosis not present

## 2022-06-27 DIAGNOSIS — F411 Generalized anxiety disorder: Secondary | ICD-10-CM | POA: Diagnosis not present

## 2022-07-06 DIAGNOSIS — F411 Generalized anxiety disorder: Secondary | ICD-10-CM | POA: Diagnosis not present

## 2022-07-18 DIAGNOSIS — F411 Generalized anxiety disorder: Secondary | ICD-10-CM | POA: Diagnosis not present

## 2022-07-27 ENCOUNTER — Ambulatory Visit (HOSPITAL_BASED_OUTPATIENT_CLINIC_OR_DEPARTMENT_OTHER): Payer: BC Managed Care – PPO | Admitting: Cardiology

## 2022-07-27 NOTE — Progress Notes (Incomplete)
Cardiology Office Note:    Date:  07/27/2022   ID:  Kirk Barker, DOB September 12, 1975, MRN [id number]  PCP:  Eulas Post, MD  Cardiologist:  Buford Dresser, MD   Referring MD: Eulas Post, MD   Chief Complaint: follow up  History of Present Illness:    Kirk Barker is a 47 y.o. male with a history of CAD s/p NSTEMI 09/13/2019 treated with DES to ramus branch, hypertension, hyperlipidemia, type 2 diabetes mellitus, chronic epigastric pain, pancreatitis, GERD, tobacco use, and median arcuate ligament syndrome seen for follow up today.  Cardiac history: 08/2019 admission for NSTEMI. Cath showed high-grade thrombotic obstruction in the equivalent of the ramus intermedius prior to bifurcation into 2 large branches. Successfully treated with PCI/DES to perpendicular branch reducing the 99% stenosis to 0% with TIMI grade III flow. Otherwise, non-obstructive CAD. Patient started on dual antiplatelet therapy, beta blocker, and high-intensity statin. Of note, patient markedly hypertensive on presentation with BP as high as the 200's/110's. LDL 126 and Hemoglobin A1c 11.6.   At his last appointment he was recovering well since his hospital admission with Covid 06/09/21, found to have anion gap metabolic acidosis. He was tolerating his medications. His blood pressure was elevated in the office and he was asked to track his pressures at home.  Today,  He denies any palpitations, chest pain, shortness of breath, or peripheral edema. No lightheadedness, headaches, syncope, orthopnea, or PND.  (+)    Past Medical History:  Diagnosis Date   Median arcuate ligament syndrome (HCC)     Past Surgical History:  Procedure Laterality Date   CHOLECYSTECTOMY     LEFT HEART CATH AND CORONARY ANGIOGRAPHY N/A 09/15/2019   Procedure: LEFT HEART CATH AND CORONARY ANGIOGRAPHY;  Surgeon: Belva Crome, MD;  Location: [place] [facility];  Service: Cardiovascular;  Laterality: N/A;    Current  Medications: No outpatient medications have been marked as taking for the 07/27/22 encounter (Appointment) with Buford Dresser, MD.     Allergies:   Bee venom, Cat hair extract, and Other   Social History   Tobacco Use   Smoking status: Former    Packs/day: 0.50    Types: Cigarettes    Quit date: 09/16/2019    Years since quitting: 2.8   Smokeless tobacco: Never  Vaping Use   Vaping Use: Never used  Substance Use Topics   Alcohol use: Yes    Comment: social    Drug use: No    Family History: The patient's family history includes Brain cancer in his maternal grandmother; Diabetes in his father; Stroke in his mother.  ROS:   Please see the history of present illness.   Remainder of review of systems otherwise unremarkable.  EKGs/Labs/Other Studies Reviewed:    The following studies were reviewed today:  Limited Echo 09/13/2019: Impressions: 1. Left ventricular ejection fraction, by estimation, is 60 to 65%. The  left ventricle has normal function. The left ventricle demonstrates  regional wall motion abnormalities (see scoring diagram/findings for  description). There is mild concentric left  ventricular hypertrophy. Left ventricular diastolic function could not be  evaluated. There is hypokinesis of the left ventricular, entire  inferolateral wall and inferior wall.   2. Right ventricular systolic function was not well visualized. The right  ventricular size is normal.   3. The mitral valve is normal in structure. Trivial mitral valve  regurgitation. No evidence of mitral stenosis.   4. The aortic valve was not assessed.  _______________  Complete Echo 09/14/2019: Impressions:  1. Left ventricular ejection fraction, by estimation, is 40 to 45%. The  left ventricle has mildly decreased function. The left ventricle  demonstrates regional wall motion abnormalities. Global hypokinesis,  appears worse in lateral wall. There is  moderate left ventricular  hypertrophy. Left ventricular diastolic  parameters are consistent with Grade I diastolic dysfunction (impaired  relaxation).   2. Right ventricular systolic function is normal. The right ventricular  size is normal. Tricuspid regurgitation signal is inadequate for assessing  PA pressure.   3. The mitral valve is normal in structure. No evidence of mitral valve  regurgitation.   4. The aortic valve is tricuspid. Aortic valve regurgitation is not  visualized. No aortic stenosis is present.   5. The inferior vena cava is normal in size with greater than 50%  respiratory variability, suggesting right atrial pressure of 3 mmHg.   6. Compared to prior study on 09/13/19, no significant change  _______________  Left Heart Catheterization 09/15/2019: Non-ST elevation myocardial infarction without EKG changes but abnormal high-sensitivity troponin I ischemic biomarkers. High-grade, thrombotic obstruction in the equivalent of the ramus intermedius prior to a bifurcation into 2 large branches.  The more perpendicular branch contained proximal 40% narrowing with widely patent ostium. Successful PCI and stent implantation across the perpendicular branch reducing the 99% stenosis to 0% with TIMI grade III flow.  The device was a 2.5 x 18 Onyx postdilated to 2.75 mm in the proximal two thirds of the stent. Widely patent left main 30% proximal LAD with mild to moderate diffuse mid and distal disease. Widely patent circumflex with minimal luminal irregularities. Codominant/dominant right coronary with 75% stenosis in the distal one third of the first left ventricular branch. Mildly reduced to low normal LV systolic function with EF 45 to 50%.  No obvious regional wall motion abnormality noted. Hypertension treated with intracoronary nitroglycerin and 2 doses of IV labetalol totaling 20 mg.   Recommendations: Preventive therapy: Aggressive lipid-lowering to LDL less than 70.  Glycemic control to hemoglobin  A1c less than 7.  Blood pressure control to 130/80 mmHg or less.  Smoking cessation. I increased losartan to 50 mg/day.  May need to increase carvedilol to 25 mg twice daily. Aspirin and Brilinta x12 months. Discharge in a.m.  EKG:  EKG was personally reviewed.  07/27/2022:  *** 07/13/21: not ordered 09/26/19: normal sinus rhythm, rate 78 bpm, with no acute ST/T changes. Normal axis. Normal PR and QRS intervals. QTc 392 ms.  Recent Labs: 03/25/2022: ALT 55; BUN 12; Creatinine, Ser 1.37; Hemoglobin 14.6; Platelets 305; Potassium 4.1; Sodium 132   Recent Lipid Panel    Component Value Date/Time   CHOL 184 01/06/2022 1612   CHOL 84 (L) 12/09/2019 1015   TRIG 97 01/06/2022 1612   HDL 41 01/06/2022 1612   HDL 29 (L) 12/09/2019 1015   CHOLHDL 4.5 01/06/2022 1612   VLDL 12 09/14/2019 0332   LDLCALC 123 (H) 01/06/2022 1612    Physical Exam:    Vital Signs: There were no vitals taken for this visit.    Wt Readings from Last 3 Encounters:  03/31/22 214 lb 14.4 oz (97.5 kg)  03/29/22 210 lb 3.2 oz (95.3 kg)  03/27/22 214 lb 8 oz (97.3 kg)    GEN: Well nourished, well developed in no acute distress HEENT: Normal, moist mucous membranes NECK: No JVD CARDIAC: regular rhythm, normal S1 and S2, no rubs or gallops. No murmur. VASCULAR: Radial and DP pulses 2+ bilaterally. No  carotid bruits RESPIRATORY:  Clear to auscultation without rales, wheezing or rhonchi  ABDOMEN: Soft, non-tender, non-distended MUSCULOSKELETAL:  Ambulates independently SKIN: Warm and dry, no edema NEUROLOGIC:  Alert and oriented x 3. No focal neuro deficits noted. PSYCHIATRIC:  Normal affect   Assessment:    No diagnosis found.  Plan:    CAD with prior NSTEMI 08/2019 -s/p PCI/DES to ramus branch. Otherwise, non-obstructive CAD. -continue aspirin 81 mg daily, completed ticagrelor 12 mos -continue carvedilol, atorvastatin -continue dapagliflozin  Ischemic Cardiomyopathy -Echo showed LVEF of 40-45% with  global hypokinesis, worse in the lateral wall, and grade 1 diastolic dysfunction.  -euvolemic on exam, NYHA class I -continue carvedilol, losartan, dapagliflozin  Hypertension -goal of <130/80, elevated today -he will check home BP and send me numbers. If remains consistently elevated, we will need to adjust regimen. Last Cr 1.12 (had pseudohyponatremia on that lab due to glucose of 356), could change to valsartan or start entresto if K and renal function allow -continue carvedilol, losartan as above  Hypercholesterolemia -LDL goal <70 -last LDL 11/2019 30, at goal. Due for recheck, given lab slips today -continue atorvastatin 80 mg daily  Type 2 Diabetes Mellitus, uncontrolled -continue dapagliflozin given CAD and reduced EF -on metformin as well -working with Dr. Elease Hashimoto on management, stressed importance of this. Now on insulin aspart and glargine. Most recent A1c 14.2  CV risk counseling and secondary prevention -recommend heart healthy/Mediterranean diet, with whole grains, fruits, vegetable, fish, lean meats, nuts, and olive oil. Limit salt. -recommend moderate walking, 3-5 times/week for 30-50 minutes each session. Aim for at least 150 minutes.week. Goal should be pace of 3 miles/hours, or walking 1.5 miles in 30 minutes -recommend avoidance of tobacco products. Avoid excess alcohol. He has continued to remain tobacco free since discharge from hospital  Plan for follow up: ***6 months or sooner as needed  Medication Adjustments/Labs and Tests Ordered: Current medicines are reviewed at length with the patient today.  Concerns regarding medicines are outlined above.   No orders of the defined types were placed in this encounter.  No orders of the defined types were placed in this encounter.  There are no Patient Instructions on file for this visit.   I,Mathew Stumpf,acting as a Education administrator for [employer], MD.,have documented all relevant documentation on the behalf of  Buford Dresser, MD,as directed by  Buford Dresser, MD while in the presence of Buford Dresser, MD.  I, Madelin Rear, have reviewed all documentation for this visit. The documentation on 07/27/22 for the exam, diagnosis, procedures, and orders are all accurate and complete.   Waynetta Pean  07/27/2022   [facility]

## 2022-07-28 ENCOUNTER — Encounter: Payer: Self-pay | Admitting: Family Medicine

## 2022-07-28 ENCOUNTER — Ambulatory Visit (INDEPENDENT_AMBULATORY_CARE_PROVIDER_SITE_OTHER): Payer: BC Managed Care – PPO | Admitting: Family Medicine

## 2022-07-28 VITALS — BP 140/86 | HR 88 | Temp 98.4°F | Ht 72.0 in | Wt 216.7 lb

## 2022-07-28 DIAGNOSIS — E1159 Type 2 diabetes mellitus with other circulatory complications: Secondary | ICD-10-CM | POA: Diagnosis not present

## 2022-07-28 DIAGNOSIS — K219 Gastro-esophageal reflux disease without esophagitis: Secondary | ICD-10-CM | POA: Diagnosis not present

## 2022-07-28 DIAGNOSIS — Z794 Long term (current) use of insulin: Secondary | ICD-10-CM

## 2022-07-28 DIAGNOSIS — I10 Essential (primary) hypertension: Secondary | ICD-10-CM | POA: Diagnosis not present

## 2022-07-28 LAB — POCT GLYCOSYLATED HEMOGLOBIN (HGB A1C): Hemoglobin A1C: 12.2 % — AB (ref 4.0–5.6)

## 2022-07-28 NOTE — Progress Notes (Unsigned)
Established Patient Office Visit  Subjective   [name], male    DOB: 13-Oct-1975  Age: 47 y.o. MRN: [id number]  Chief Complaint  Patient presents with   Medical Management of Chronic Issues    HPI  {History (Optional):23778} Kirk Barker is here for medical follow-up.  He has history of CAD, hypertension, type 2 diabetes, history of pancreatitis, GERD.  Poor compliance with medications.  He states that he had some type of acute illness about 2 and half weeks ago and has not taken basically any of his medications for the past couple weeks until couple days ago.  He is currently on Farxiga 10 mg daily, metformin 500 mg 2 tablets twice daily, Basaglar 30 units once daily, and NovoLog 10 units 3 times daily with meals though again not taking any those until few days ago.  Rarely monitors blood sugar.  Last A1c was 10.7% but had been up over 14% last year.  He also just recently started back on his amlodipine 10 mg daily along with carvedilol 25 mg twice daily and losartan/HCTZ 100/12.5 mg daily. He is supposed to be on high-dose atorvastatin 80 mg daily and Zetia but inconsistent with taking.  He has a longstanding history of GERD and constant burning sensation substernally with extensive workup at multiple medical centers previously.  He had requested local referral last year which was placed last March and there is evidence in looking at the referral noted that he was contacted but the pain apparently he never responded.  He is requesting referral again.  Past Medical History:  Diagnosis Date   Median arcuate ligament syndrome [facility])    Past Surgical History:  Procedure Laterality Date   CHOLECYSTECTOMY     LEFT HEART CATH AND CORONARY ANGIOGRAPHY N/A 09/15/2019   Procedure: LEFT HEART CATH AND CORONARY ANGIOGRAPHY;  Surgeon: Belva Crome, MD;  Location: [place] [facility];  Service: Cardiovascular;  Laterality: N/A;    reports that he quit smoking about 2 years ago. His  smoking use included cigarettes. He smoked an average of .5 packs per day. He has never used smokeless tobacco. He reports current alcohol use. He reports that he does not use drugs. family history includes Brain cancer in his maternal grandmother; Diabetes in his father; Stroke in his mother. Allergies  Allergen Reactions   Bee Venom    Cat Hair Extract Anxiety, Itching and Other (See Comments)    Itching , watering eyes  Itching , watering eyes  Itching , watering eyes  Itching , watering eyes     Other Itching    ROS    Objective:     BP (!) 140/86 (BP Location: Left Arm, Patient Position: Sitting, Cuff Size: Large)   Pulse 88   Temp 98.4 F (36.9 C) (Oral)   Ht 6' (1.829 m)   Wt 216 lb 11.2 oz (98.3 kg)   SpO2 99%   BMI 29.39 kg/m  {Vitals History (Optional):23777}  Physical Exam   Results for orders placed or performed in visit on 07/28/22  POC HgB A1c  Result Value Ref Range   Hemoglobin A1C 12.2 (A) 4.0 - 5.6 %   HbA1c POC (<> result, manual entry)     HbA1c, POC (prediabetic range)     HbA1c, POC (controlled diabetic range)      {Labs (Optional):23779}  The ASCVD Risk score (Arnett DK, et al., 2019) failed to calculate for the following reasons:   The patient has a prior  MI or stroke diagnosis    Assessment & Plan:   #1 type 2 diabetes poorly controlled with A1c 12.2% today.  Has not been compliant recently with medications for several weeks.  We strongly recommend he get back on his usual medications regularly.  We have strongly recommended he check fasting blood sugars daily and increase Basaglar 2 units every 3 days until fasting sugars consistently 130 or less.  We also discussed setting up endocrinology referral if he is willing-but he will certainly have to step up compliance to have control wherever he is seen  #2 hypertension.  Slightly up today but recent poor compliance with medications.  Get back on blood pressure medications as outlined above  regularly.  Monitor blood pressure and be in touch if not consistently less than 130/80  #3 chronic GERD symptoms poorly controlled on medications.  Patient requesting referral to local GI.  This was placed last year and there is evidence that he was contacted but apparently never responded back.  Per his request we will set this up again No follow-ups on file.    Carolann Littler, MD

## 2022-07-28 NOTE — Patient Instructions (Signed)
Check fasting sugars daily.  Increase the Basaglar by 2 units every 3 days until fasting sugars consistently < 130.    I will be setting up Endocrinology referral.     Continue the Novolog 10 units prior to every meal for now

## 2022-08-01 DIAGNOSIS — F411 Generalized anxiety disorder: Secondary | ICD-10-CM | POA: Diagnosis not present

## 2022-08-14 ENCOUNTER — Other Ambulatory Visit: Payer: Self-pay | Admitting: Family Medicine

## 2022-08-15 DIAGNOSIS — F411 Generalized anxiety disorder: Secondary | ICD-10-CM | POA: Diagnosis not present

## 2022-08-29 DIAGNOSIS — F411 Generalized anxiety disorder: Secondary | ICD-10-CM | POA: Diagnosis not present

## 2022-09-12 DIAGNOSIS — F411 Generalized anxiety disorder: Secondary | ICD-10-CM | POA: Diagnosis not present

## 2022-09-21 ENCOUNTER — Encounter: Payer: Self-pay | Admitting: Internal Medicine

## 2022-10-14 DIAGNOSIS — F332 Major depressive disorder, recurrent severe without psychotic features: Secondary | ICD-10-CM | POA: Diagnosis not present

## 2022-10-14 DIAGNOSIS — F411 Generalized anxiety disorder: Secondary | ICD-10-CM | POA: Diagnosis not present

## 2022-10-21 DIAGNOSIS — F411 Generalized anxiety disorder: Secondary | ICD-10-CM | POA: Diagnosis not present

## 2022-10-21 DIAGNOSIS — F332 Major depressive disorder, recurrent severe without psychotic features: Secondary | ICD-10-CM | POA: Diagnosis not present

## 2022-11-18 DIAGNOSIS — F332 Major depressive disorder, recurrent severe without psychotic features: Secondary | ICD-10-CM | POA: Diagnosis not present

## 2022-11-18 DIAGNOSIS — F411 Generalized anxiety disorder: Secondary | ICD-10-CM | POA: Diagnosis not present

## 2022-11-27 ENCOUNTER — Encounter: Payer: Self-pay | Admitting: Internal Medicine

## 2022-11-27 ENCOUNTER — Ambulatory Visit (INDEPENDENT_AMBULATORY_CARE_PROVIDER_SITE_OTHER): Payer: BC Managed Care – PPO | Admitting: Internal Medicine

## 2022-11-27 VITALS — BP 126/84 | HR 89 | Ht 72.0 in | Wt 207.0 lb

## 2022-11-27 DIAGNOSIS — E119 Type 2 diabetes mellitus without complications: Secondary | ICD-10-CM | POA: Diagnosis not present

## 2022-11-27 DIAGNOSIS — K219 Gastro-esophageal reflux disease without esophagitis: Secondary | ICD-10-CM | POA: Diagnosis not present

## 2022-11-27 DIAGNOSIS — R1310 Dysphagia, unspecified: Secondary | ICD-10-CM | POA: Diagnosis not present

## 2022-11-27 DIAGNOSIS — Z794 Long term (current) use of insulin: Secondary | ICD-10-CM

## 2022-11-27 DIAGNOSIS — R1013 Epigastric pain: Secondary | ICD-10-CM | POA: Diagnosis not present

## 2022-11-27 DIAGNOSIS — G8929 Other chronic pain: Secondary | ICD-10-CM

## 2022-11-27 MED ORDER — PANTOPRAZOLE SODIUM 40 MG PO TBEC: 40.0000 mg | DELAYED_RELEASE_TABLET | Freq: Two times a day (BID) | ORAL | 11 refills | Status: DC

## 2022-11-27 NOTE — Progress Notes (Signed)
HISTORY OF PRESENT ILLNESS:  Kirk Barker is a 47 y.o. male, Training and development officer for [employer], who presents today regarding chronic reflux and chronic epigastric pain.  The patient reports chronic reflux disease as manifested by pyrosis and regurgitation.  About 6 months ago he was prescribed pantoprazole 40 mg daily.  For the most part he is compliant.  He does notice that the medication is helpful, though he does have significant breakthrough symptoms.  No dysphagia.  He has been diabetic for little over a year.  Initial hemoglobin A1c 14.  Most recently 9.  On multiple agents.  No diet aphasia.  He has had problems with chronic persistent epigastric pain since 2014.  He describes it as constant and squeezing.  This begins when he awakens and persist throughout the day.  He cannot identify any exacerbating or relieving factors such as meals or change in body position.  At times he will experience issues with nausea and vomiting, worsening domino pain, diarrhea.  He has been a chronic cannabis user.  It was suggested to him that he may have cannabis hyperemesis syndrome with other associated GI complaints.  He tells me that he did discontinue using marijuana at the end of November 2023.  Patient has had prior extensive GI workup at [facility] and [facility] in 2019.  Outside records reviewed.  This included upper endoscopies at [facility] and gastric emptying scan.  Not much workup at [facility].  He was sent to GI at that time regarding possible medial arcuate ligament syndrome, which he was NOT felt to have.  Review of outside blood work from March 25, 2022 shows elevated glucose at 237.  Creatinine 1.37.  ALT 55.  Normal CBC with hemoglobin 14.6.  Hemoglobin A1c July 28, 2022 was 12.2.  CT scan of the abdomen and pelvis with contrast October 2023 suggested subcutaneous abscess in the right groin region  He is status post cholecystectomy  REVIEW OF SYSTEMS:  All non-GI ROS negative unless otherwise  stated in the HPI except for  Past Medical History:  Diagnosis Date   Median arcuate ligament syndrome [facility])     Past Surgical History:  Procedure Laterality Date   CHOLECYSTECTOMY     LEFT HEART CATH AND CORONARY ANGIOGRAPHY N/A 09/15/2019   Procedure: LEFT HEART CATH AND CORONARY ANGIOGRAPHY;  Surgeon: Lyn Records, MD;  Location: [facility] INVASIVE [facility] LAB;  Service: Cardiovascular;  Laterality: N/A;    Social History Kirk Barker  reports that he quit smoking about 3 years ago. His smoking use included cigarettes. He smoked an average of .5 packs per day. He has never used smokeless tobacco. He reports current alcohol use. He reports that he does not use drugs.  family history includes Brain cancer in his maternal grandmother; Diabetes in his father; Stroke in his mother.  Allergies  Allergen Reactions   Bee Venom    Cat Hair Extract Anxiety, Itching and Other (See Comments)    Itching , watering eyes  Itching , watering eyes  Itching , watering eyes  Itching , watering eyes     Other Itching       PHYSICAL EXAMINATION: Vital signs: BP 126/84   Pulse 89   Ht 6' (1.829 m)   Wt 207 lb (93.9 kg)   BMI 28.07 kg/m   Constitutional: generally well-appearing, no acute distress Psychiatric: alert and oriented x3, cooperative Eyes: extraocular movements intact, anicteric, conjunctiva pink Mouth: oral pharynx moist, no lesions.  No thrush  Neck: supple no lymphadenopathy Cardiovascular: heart regular rate and rhythm, no murmur Lungs: clear to auscultation bilaterally Abdomen: soft, nontender, nondistended, no obvious ascites, no peritoneal signs, normal bowel sounds, no organomegaly.  No succussion splash Rectal: Omitted Extremities: no clubbing, cyanosis, or lower extremity edema bilaterally Skin: no lesions on visible extremities Neuro: No focal deficits.  Cranial nerves intact  ASSESSMENT:  1.  GERD.  Incompletely treated with once daily PPI.  Rule out refractory GERD.   Rule out thrush. 2.  Chronic epigastric pain 10 years duration.  No change.  Negative prior workups 3.  History of chronic cannabis use.  Reports abstinence since November 2023 4.  Poorly controlled diabetes mellitus 5.  Status post cholecystectomy   PLAN:  1.  Reflux precautions 2.  Prescribe pantoprazole 40 mg TWICE daily.  Told to take 30 to 60 minutes before morning meal and evening meal.  Medication risk reviewed. 3.  Good diabetic control important for good GI health 4.  Schedule upper endoscopy to rule out thrush or other entities masquerading as GERD.  The patient is higher than average risk due to his comorbidities, namely diabetes..The nature of the procedure, as well as the risks, benefits, and alternatives were carefully and thoroughly reviewed with the patient. Ample time for discussion and questions allowed. The patient understood, was satisfied, and agreed to proceed. 5.  Adjust diabetic medications preprocedure 6.  Further recommendations after the above Total time of 60 minutes was spent preparing to see the patient, reviewing outside records, reviewing x-rays and laboratories, obtaining comprehensive history, performing medically appropriate physical exam, counseling educating the patient regarding the above listed issues, ordering medication, ordering endoscopic procedure, adjusting medications for his procedure, and documenting clinical information in the health record

## 2022-11-27 NOTE — Patient Instructions (Signed)
You have been scheduled for an endoscopy. Please follow written instructions given to you at your visit today.  If you use inhalers (even only as needed), please bring them with you on the day of your procedure.  If you take any of the following medications, they will need to be adjusted prior to your procedure:   DO NOT TAKE 7 DAYS PRIOR TO TEST- Trulicity (dulaglutide) Ozempic, Wegovy (semaglutide) Mounjaro (tirzepatide) Bydureon Bcise (exanatide extended release)  DO NOT TAKE 1 DAY PRIOR TO YOUR TEST Rybelsus (semaglutide) Adlyxin (lixisenatide) Victoza (liraglutide) Byetta (exanatide) ___________________________________________________________________________   We have sent the following medications to your pharmacy for you to pick up at your convenience: Pantoprazole -Increase your Pantoprazole to 40 mg twice daily.   _______________________________________________________  If your blood pressure at your visit was 140/90 or greater, please contact your primary care physician to follow up on this.  _______________________________________________________  If you are age 14 or older, your body mass index should be between 23-30. Your Body mass index is 28.07 kg/m. If this is out of the aforementioned range listed, please consider follow up with your Primary Care Provider.  If you are age 34 or younger, your body mass index should be between 19-25. Your Body mass index is 28.07 kg/m. If this is out of the aformentioned range listed, please consider follow up with your Primary Care Provider.   ________________________________________________________  The [facility] providers would like to encourage you to use [facility] to communicate with providers for non-urgent requests or questions.  Due to long hold times on the telephone, sending your provider a message by [facility] may be a faster and more efficient way to get a response.  Please allow 48 business hours for a response.  Please  remember that this is for non-urgent requests.  _______________________________________________________  Thank you for choosing me and [facility]. Yancey Flemings

## 2022-12-05 DIAGNOSIS — F332 Major depressive disorder, recurrent severe without psychotic features: Secondary | ICD-10-CM | POA: Diagnosis not present

## 2022-12-05 DIAGNOSIS — F411 Generalized anxiety disorder: Secondary | ICD-10-CM | POA: Diagnosis not present

## 2022-12-08 ENCOUNTER — Encounter: Payer: BC Managed Care – PPO | Admitting: Internal Medicine

## 2022-12-08 ENCOUNTER — Telehealth: Payer: Self-pay | Admitting: Internal Medicine

## 2022-12-08 NOTE — Telephone Encounter (Signed)
Patient called to inform he will not be able to make it for his 2 pm procedure says he is very ill and will call back to reschedule.

## 2022-12-08 NOTE — Telephone Encounter (Signed)
Noted  

## 2022-12-08 NOTE — Telephone Encounter (Signed)
Please see note.

## 2022-12-20 ENCOUNTER — Other Ambulatory Visit: Payer: Self-pay | Admitting: *Deleted

## 2023-01-08 ENCOUNTER — Other Ambulatory Visit: Payer: Self-pay | Admitting: Family Medicine

## 2023-02-06 ENCOUNTER — Other Ambulatory Visit: Payer: Self-pay | Admitting: Family Medicine

## 2023-02-07 ENCOUNTER — Other Ambulatory Visit: Payer: Self-pay | Admitting: Family Medicine

## 2023-02-16 DIAGNOSIS — Z630 Problems in relationship with spouse or partner: Secondary | ICD-10-CM | POA: Diagnosis not present

## 2023-02-16 DIAGNOSIS — F4325 Adjustment disorder with mixed disturbance of emotions and conduct: Secondary | ICD-10-CM | POA: Diagnosis not present

## 2023-02-23 DIAGNOSIS — F4325 Adjustment disorder with mixed disturbance of emotions and conduct: Secondary | ICD-10-CM | POA: Diagnosis not present

## 2023-02-23 DIAGNOSIS — Z630 Problems in relationship with spouse or partner: Secondary | ICD-10-CM | POA: Diagnosis not present

## 2023-03-02 DIAGNOSIS — F4325 Adjustment disorder with mixed disturbance of emotions and conduct: Secondary | ICD-10-CM | POA: Diagnosis not present

## 2023-03-02 DIAGNOSIS — Z630 Problems in relationship with spouse or partner: Secondary | ICD-10-CM | POA: Diagnosis not present

## 2023-03-09 DIAGNOSIS — F4325 Adjustment disorder with mixed disturbance of emotions and conduct: Secondary | ICD-10-CM | POA: Diagnosis not present

## 2023-03-09 DIAGNOSIS — Z630 Problems in relationship with spouse or partner: Secondary | ICD-10-CM | POA: Diagnosis not present

## 2023-03-16 DIAGNOSIS — Z630 Problems in relationship with spouse or partner: Secondary | ICD-10-CM | POA: Diagnosis not present

## 2023-03-16 DIAGNOSIS — F4325 Adjustment disorder with mixed disturbance of emotions and conduct: Secondary | ICD-10-CM | POA: Diagnosis not present

## 2023-03-27 DIAGNOSIS — F4325 Adjustment disorder with mixed disturbance of emotions and conduct: Secondary | ICD-10-CM | POA: Diagnosis not present

## 2023-03-27 DIAGNOSIS — Z630 Problems in relationship with spouse or partner: Secondary | ICD-10-CM | POA: Diagnosis not present

## 2023-04-03 DIAGNOSIS — F4325 Adjustment disorder with mixed disturbance of emotions and conduct: Secondary | ICD-10-CM | POA: Diagnosis not present

## 2023-04-03 DIAGNOSIS — Z630 Problems in relationship with spouse or partner: Secondary | ICD-10-CM | POA: Diagnosis not present

## 2023-04-05 ENCOUNTER — Other Ambulatory Visit: Payer: Self-pay | Admitting: Family Medicine

## 2023-04-18 ENCOUNTER — Other Ambulatory Visit: Payer: Self-pay | Admitting: Family Medicine

## 2023-04-20 DIAGNOSIS — Z630 Problems in relationship with spouse or partner: Secondary | ICD-10-CM | POA: Diagnosis not present

## 2023-04-20 DIAGNOSIS — F4325 Adjustment disorder with mixed disturbance of emotions and conduct: Secondary | ICD-10-CM | POA: Diagnosis not present

## 2023-05-04 ENCOUNTER — Other Ambulatory Visit: Payer: Self-pay | Admitting: Family Medicine

## 2023-05-17 DIAGNOSIS — Z630 Problems in relationship with spouse or partner: Secondary | ICD-10-CM | POA: Diagnosis not present

## 2023-05-17 DIAGNOSIS — F4325 Adjustment disorder with mixed disturbance of emotions and conduct: Secondary | ICD-10-CM | POA: Diagnosis not present

## 2023-05-29 IMAGING — DX DG CHEST 1V PORT
1 series · 1 of 1 positions shown · non-contrast
Comparison: 09/13/2019

CLINICAL DATA: Possible sepsis

EXAM:
PORTABLE CHEST 1 VIEW

[chest ap]
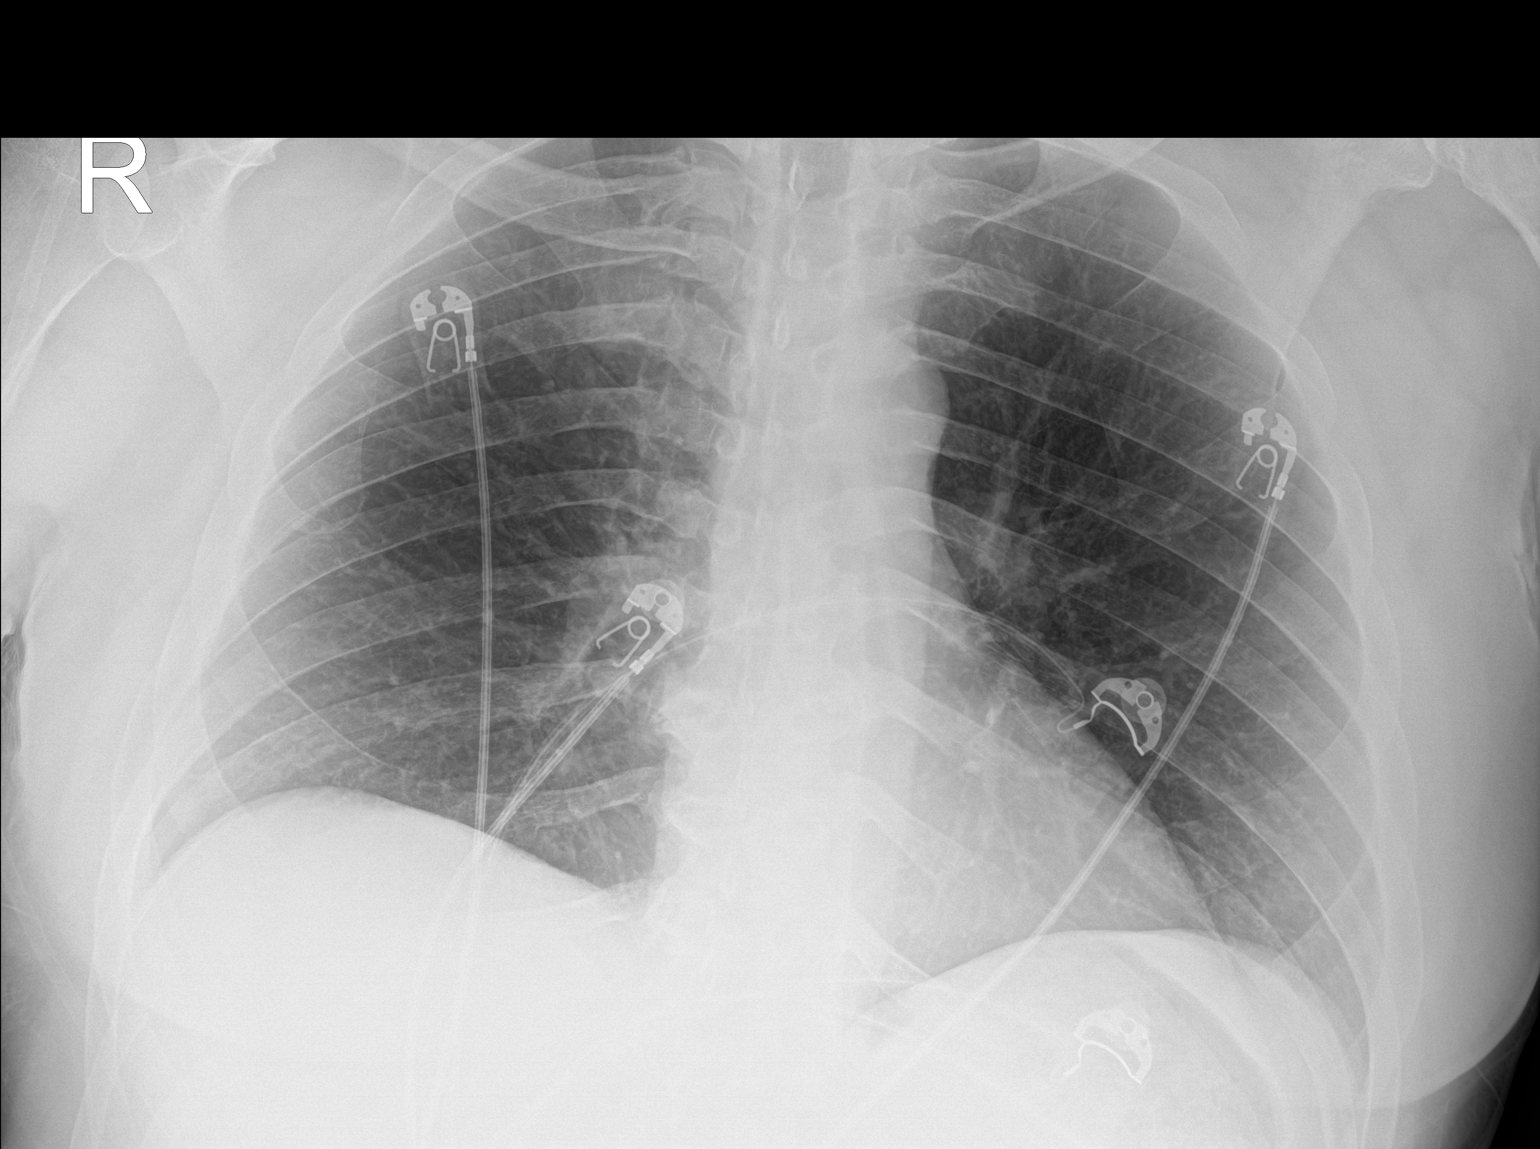

[1 of 1 positions shown; findings below may reference images not displayed]

FINDINGS: The heart size and mediastinal contours are within normal limits.
Both lungs are clear. The visualized skeletal structures are
unremarkable.
IMPRESSION: No active disease.

## 2023-05-31 IMAGING — CT CT ABD-PELV W/ CM
2 of 5 series · 16 of 46 positions shown, 18 images · IV contrast (agent unspecified)
Comparison: September 13, 2019

CLINICAL DATA: Abdominal pain.

EXAM:
CT ABDOMEN AND PELVIS WITH CONTRAST
TECHNIQUE: Multidetector CT imaging of the abdomen and pelvis was performed
using the standard protocol following bolus administration of
intravenous contrast.

[Series 3: abd/ pelvis 5.0 i30f 2 · axial · 0.92mm/px · z∈[+1049,+1479]mm · 13 of 98 slices shown, 15 images]
[im 6/98  soft-tissue]
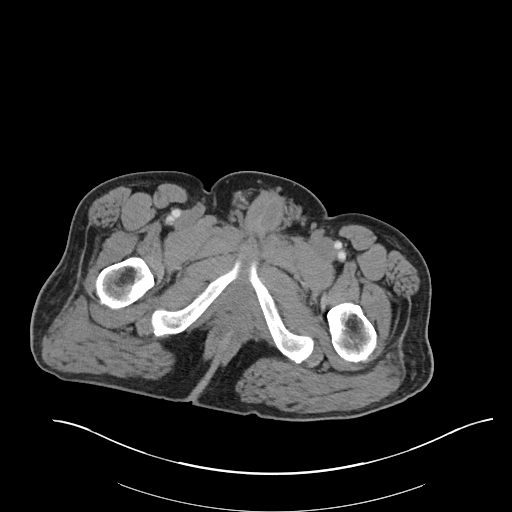
[im 6/98  bone]
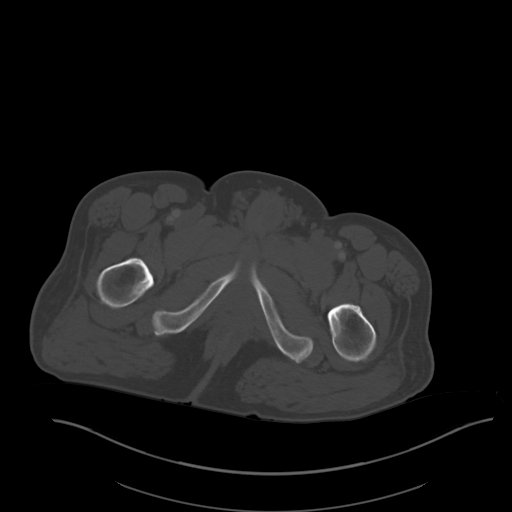
[im 16/98  soft-tissue]
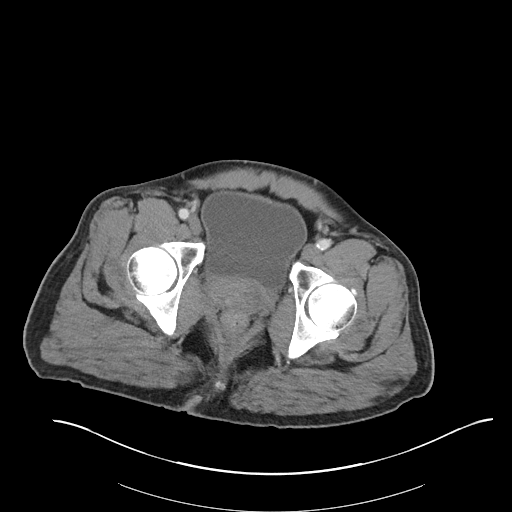
[im 21/98  soft-tissue]
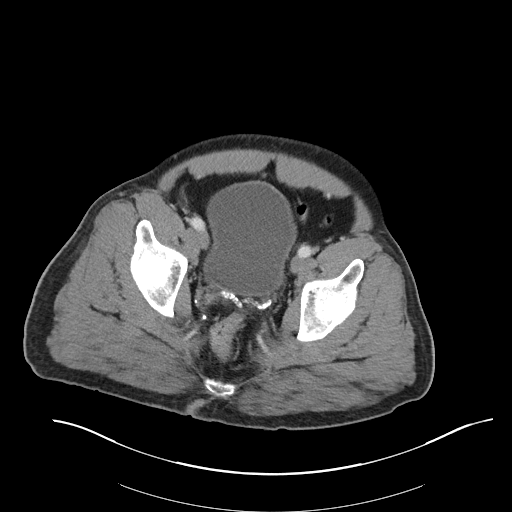
[im 26/98  soft-tissue]
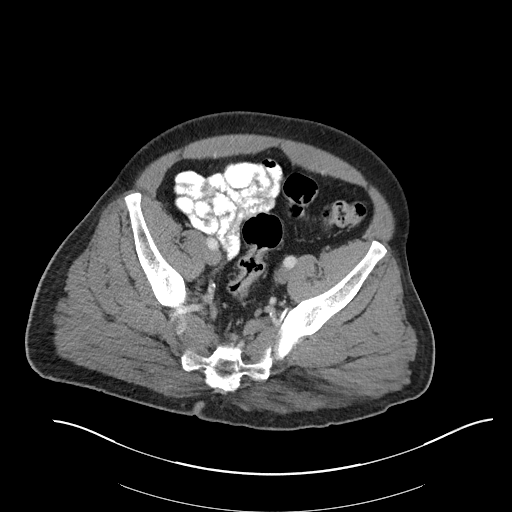
[im 36/98  soft-tissue]
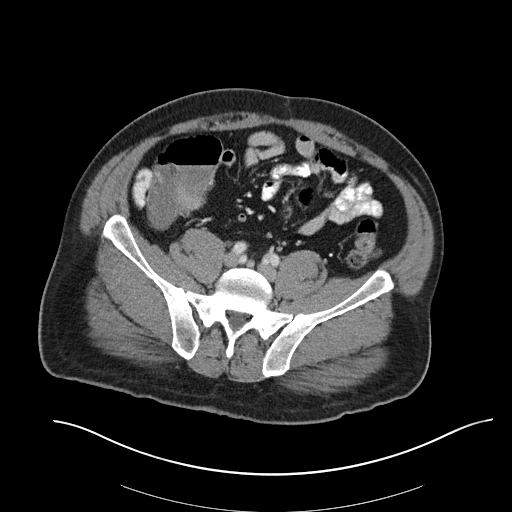
[im 41/98  soft-tissue]
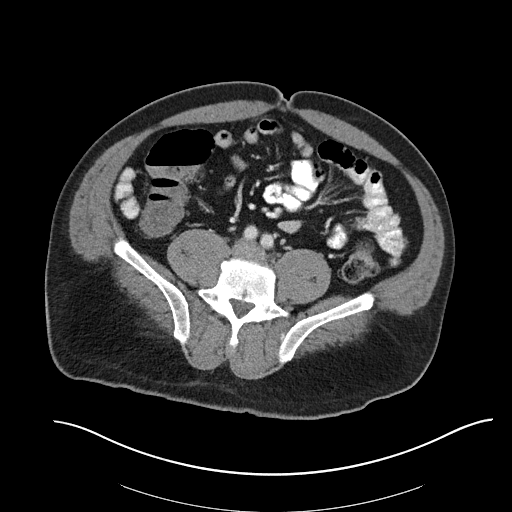
[im 52/98  soft-tissue]
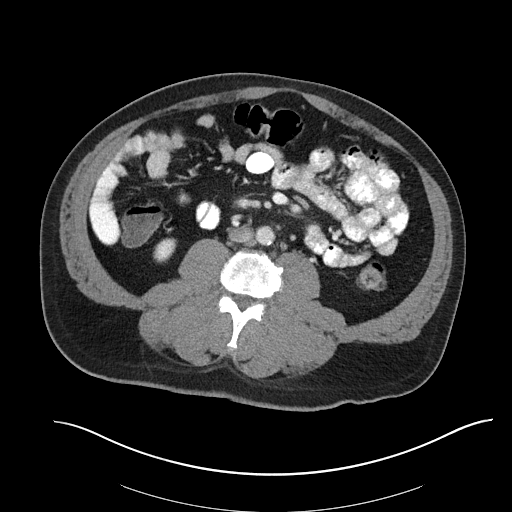
[im 57/98  soft-tissue]
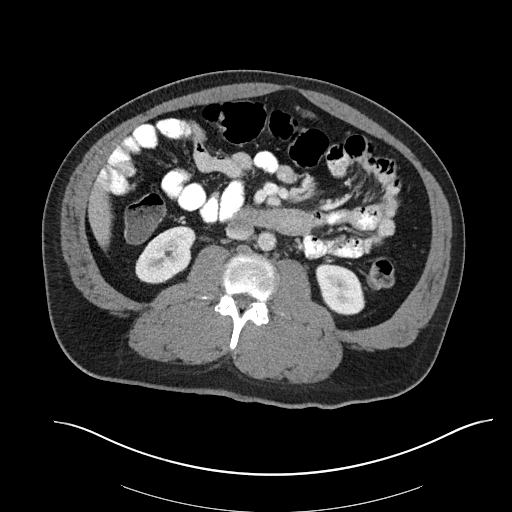
[im 62/98  soft-tissue]
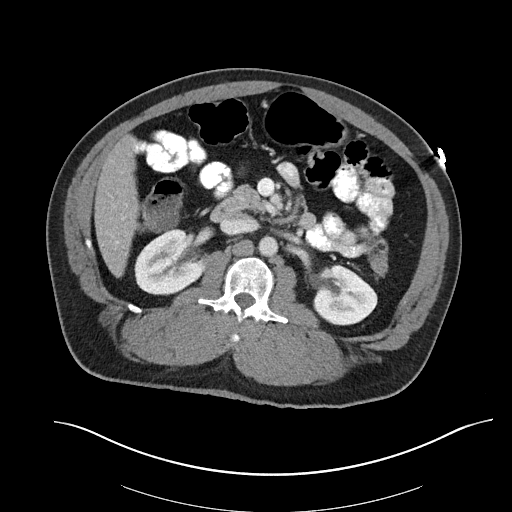
[im 62/98  bone]
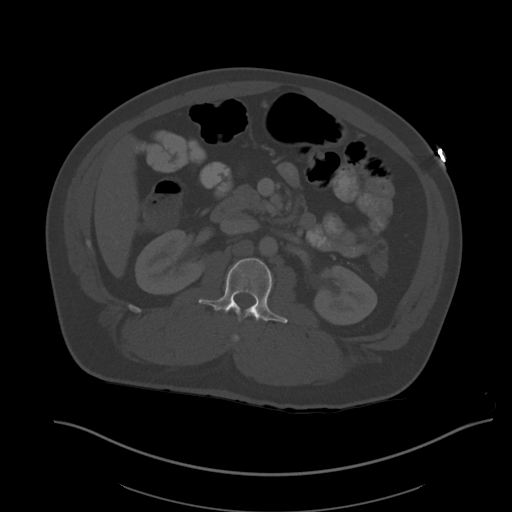
[im 72/98  soft-tissue]
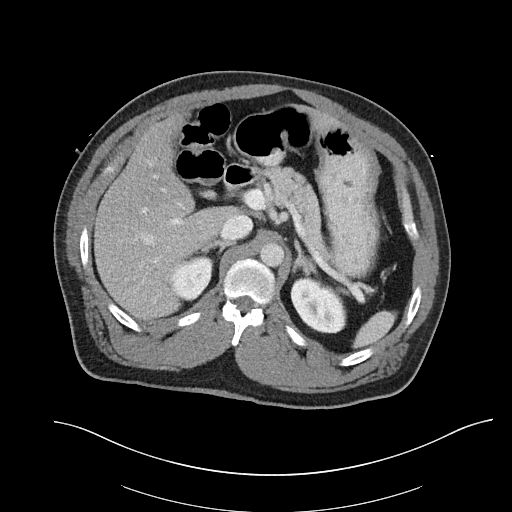
[im 77/98  soft-tissue]
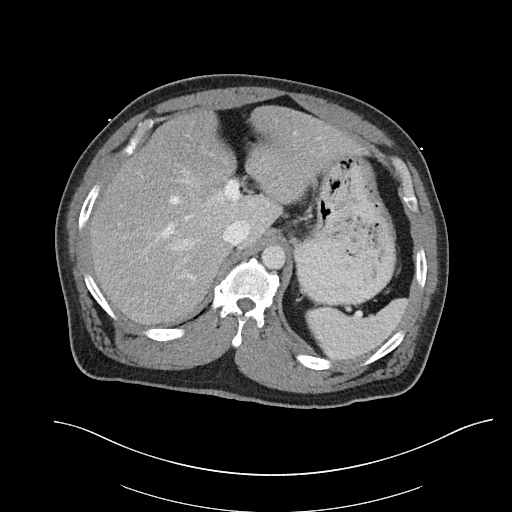
[im 82/98  soft-tissue]
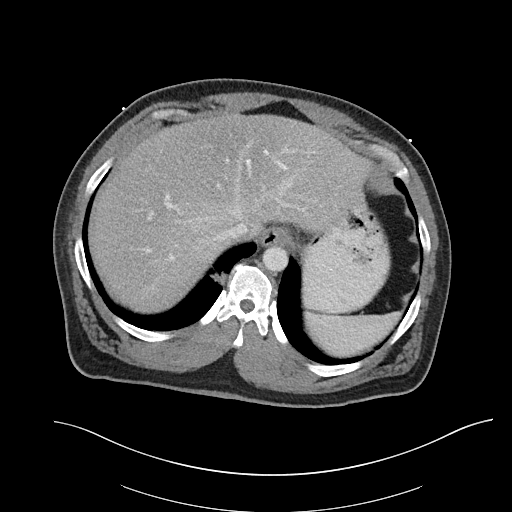
[im 92/98  soft-tissue]
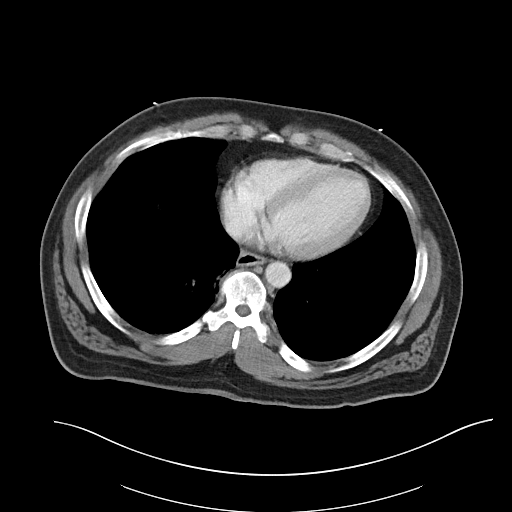

[Series 6: coronal soft tissue · coronal · 0.83mm/px · 3 of 110 slices shown]
[im 37/110  soft-tissue]
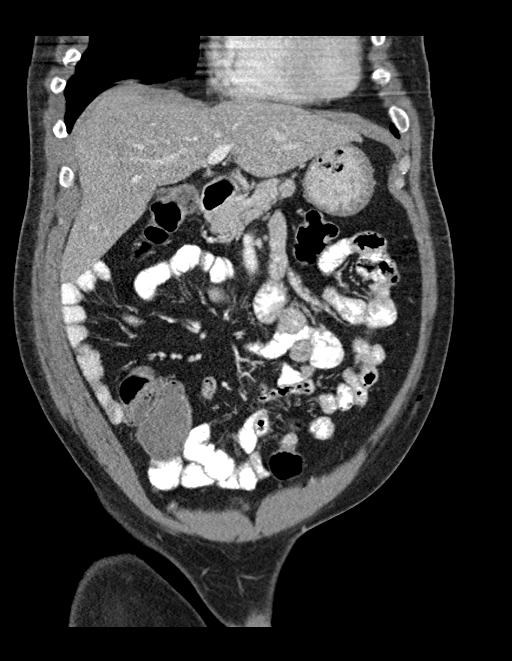
[im 49/110  soft-tissue]
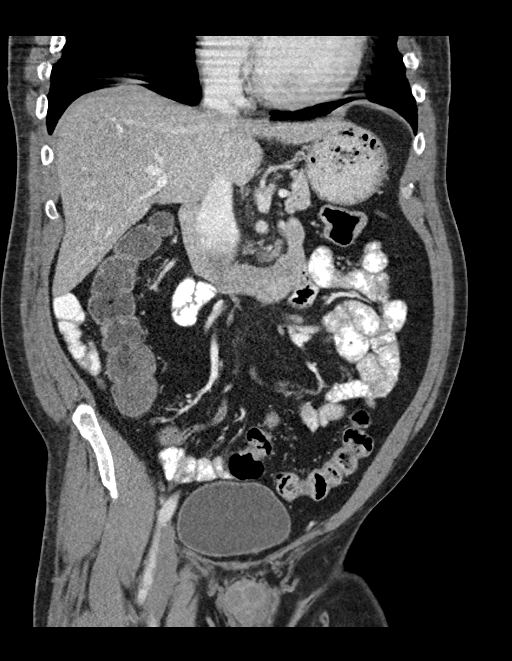
[im 61/110  soft-tissue]
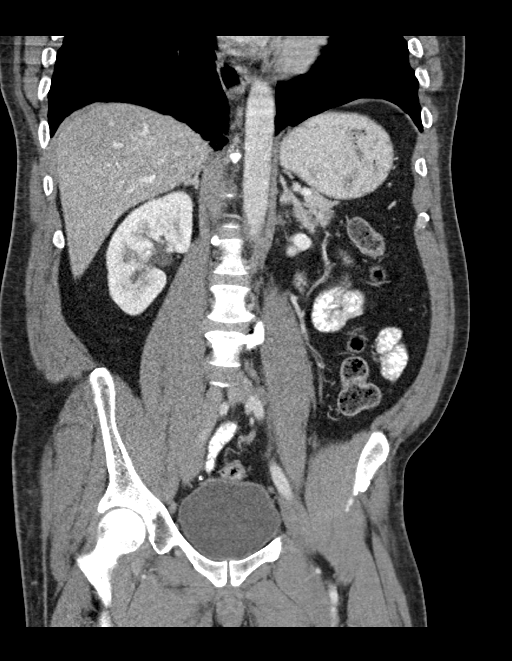

[16 of 46 positions shown; findings below may reference images not displayed]

RADIATION DOSE REDUCTION: This exam was performed according to the
departmental dose-optimization program which includes automated
exposure control, adjustment of the mA and/or kV according to
patient size and/or use of iterative reconstruction technique.

CONTRAST:  100mL OMNIPAQUE IOHEXOL 300 MG/ML  SOLN
FINDINGS: Lower chest: 1.3 cm pulmonary nodule versus rounded atelectasis
versus scarring in the right lung base, image 80/23, sequence 5,
stable.

Hepatobiliary: No focal liver abnormality is seen. Status post
cholecystectomy. No biliary dilatation.

Pancreas: Unremarkable. No pancreatic ductal dilatation or
surrounding inflammatory changes.

Spleen: Normal in size without focal abnormality.

Adrenals/Urinary Tract: Adrenal glands are unremarkable. Kidneys are
normal, without renal calculi, focal lesion, or hydronephrosis.
Bladder is unremarkable.

Stomach/Bowel: Stomach is within normal limits. Appendix appears
normal. No evidence of bowel wall thickening, distention, or
inflammatory changes.

Vascular/Lymphatic: Aortic atherosclerosis, mild. No enlarged
abdominal or pelvic lymph nodes.

Reproductive: Prostate is unremarkable.

Other: No abdominal wall hernia or abnormality. No abdominopelvic
ascites.

Musculoskeletal: No acute or significant osseous findings.
IMPRESSION: 1. No acute abnormalities within the abdomen or pelvis.
[DATE] cm pulmonary nodule versus rounded atelectasis versus
scarring in the right lung base, stable.

Aortic Atherosclerosis ([id number]).

## 2023-06-07 DIAGNOSIS — Z630 Problems in relationship with spouse or partner: Secondary | ICD-10-CM | POA: Diagnosis not present

## 2023-06-07 DIAGNOSIS — F4325 Adjustment disorder with mixed disturbance of emotions and conduct: Secondary | ICD-10-CM | POA: Diagnosis not present

## 2023-06-21 DIAGNOSIS — F4325 Adjustment disorder with mixed disturbance of emotions and conduct: Secondary | ICD-10-CM | POA: Diagnosis not present

## 2023-06-21 DIAGNOSIS — Z630 Problems in relationship with spouse or partner: Secondary | ICD-10-CM | POA: Diagnosis not present

## 2023-07-04 ENCOUNTER — Other Ambulatory Visit: Payer: Self-pay | Admitting: Internal Medicine

## 2023-07-04 DIAGNOSIS — F329 Major depressive disorder, single episode, unspecified: Secondary | ICD-10-CM | POA: Diagnosis not present

## 2023-07-04 DIAGNOSIS — F4310 Post-traumatic stress disorder, unspecified: Secondary | ICD-10-CM | POA: Diagnosis not present

## 2023-07-04 DIAGNOSIS — F419 Anxiety disorder, unspecified: Secondary | ICD-10-CM | POA: Diagnosis not present

## 2023-08-19 DIAGNOSIS — K6389 Other specified diseases of intestine: Secondary | ICD-10-CM | POA: Diagnosis not present

## 2023-08-19 DIAGNOSIS — R1013 Epigastric pain: Secondary | ICD-10-CM | POA: Diagnosis not present

## 2023-08-19 DIAGNOSIS — K3184 Gastroparesis: Secondary | ICD-10-CM | POA: Diagnosis not present

## 2023-08-19 DIAGNOSIS — R710 Precipitous drop in hematocrit: Secondary | ICD-10-CM | POA: Diagnosis not present

## 2023-08-19 DIAGNOSIS — I10 Essential (primary) hypertension: Secondary | ICD-10-CM | POA: Diagnosis not present

## 2023-08-19 DIAGNOSIS — E1143 Type 2 diabetes mellitus with diabetic autonomic (poly)neuropathy: Secondary | ICD-10-CM | POA: Diagnosis not present

## 2023-08-19 DIAGNOSIS — R739 Hyperglycemia, unspecified: Secondary | ICD-10-CM | POA: Diagnosis not present

## 2023-08-19 DIAGNOSIS — Z9049 Acquired absence of other specified parts of digestive tract: Secondary | ICD-10-CM | POA: Diagnosis not present

## 2023-08-19 DIAGNOSIS — E785 Hyperlipidemia, unspecified: Secondary | ICD-10-CM | POA: Diagnosis not present

## 2023-08-19 DIAGNOSIS — R1084 Generalized abdominal pain: Secondary | ICD-10-CM | POA: Diagnosis not present

## 2023-08-19 DIAGNOSIS — N3289 Other specified disorders of bladder: Secondary | ICD-10-CM | POA: Diagnosis not present

## 2023-08-19 DIAGNOSIS — D72829 Elevated white blood cell count, unspecified: Secondary | ICD-10-CM | POA: Diagnosis not present

## 2023-08-19 DIAGNOSIS — F17210 Nicotine dependence, cigarettes, uncomplicated: Secondary | ICD-10-CM | POA: Diagnosis not present

## 2023-08-19 DIAGNOSIS — K219 Gastro-esophageal reflux disease without esophagitis: Secondary | ICD-10-CM | POA: Diagnosis not present

## 2023-08-19 DIAGNOSIS — R109 Unspecified abdominal pain: Secondary | ICD-10-CM | POA: Diagnosis not present

## 2023-08-19 DIAGNOSIS — E860 Dehydration: Secondary | ICD-10-CM | POA: Diagnosis not present

## 2023-08-19 DIAGNOSIS — R112 Nausea with vomiting, unspecified: Secondary | ICD-10-CM | POA: Diagnosis not present

## 2023-08-19 DIAGNOSIS — K529 Noninfective gastroenteritis and colitis, unspecified: Secondary | ICD-10-CM | POA: Diagnosis not present

## 2023-08-19 DIAGNOSIS — N179 Acute kidney failure, unspecified: Secondary | ICD-10-CM | POA: Diagnosis not present

## 2023-08-19 DIAGNOSIS — K56600 Partial intestinal obstruction, unspecified as to cause: Secondary | ICD-10-CM | POA: Diagnosis not present

## 2023-08-19 DIAGNOSIS — R000 Tachycardia, unspecified: Secondary | ICD-10-CM | POA: Diagnosis not present

## 2023-08-19 DIAGNOSIS — K760 Fatty (change of) liver, not elsewhere classified: Secondary | ICD-10-CM | POA: Diagnosis not present

## 2023-08-19 DIAGNOSIS — K921 Melena: Secondary | ICD-10-CM | POA: Diagnosis not present

## 2023-08-19 DIAGNOSIS — R0902 Hypoxemia: Secondary | ICD-10-CM | POA: Diagnosis not present

## 2023-08-19 DIAGNOSIS — R197 Diarrhea, unspecified: Secondary | ICD-10-CM | POA: Diagnosis not present

## 2023-08-19 DIAGNOSIS — R0689 Other abnormalities of breathing: Secondary | ICD-10-CM | POA: Diagnosis not present

## 2023-08-19 DIAGNOSIS — F1290 Cannabis use, unspecified, uncomplicated: Secondary | ICD-10-CM | POA: Diagnosis not present

## 2023-08-19 DIAGNOSIS — E119 Type 2 diabetes mellitus without complications: Secondary | ICD-10-CM | POA: Diagnosis not present

## 2023-08-19 DIAGNOSIS — E1165 Type 2 diabetes mellitus with hyperglycemia: Secondary | ICD-10-CM | POA: Diagnosis not present

## 2023-08-19 DIAGNOSIS — Z79899 Other long term (current) drug therapy: Secondary | ICD-10-CM | POA: Diagnosis not present

## 2023-08-22 ENCOUNTER — Telehealth: Payer: Self-pay

## 2023-08-22 NOTE — Transitions of Care (Post Inpatient/ED Visit) (Signed)
 08/22/2023  Name: Kirk Barker MRN: [id number] DOB: Nov 20, 1975  Today's TOC FU Call Status: Today's TOC FU Call Status:: Successful TOC FU Call Completed TOC FU Call Complete Date: 08/22/23 Patient's Name and Date of Birth confirmed.  Transition Care Management Follow-up Telephone Call Date of Discharge: 08/21/23 Discharge Facility: Other ([facility]) Name of Other ([facility]) Discharge Facility: [facility] Type of Discharge: Inpatient Admission Primary Inpatient Discharge Diagnosis:: Small bowel obstruction How have you been since you were released from the hospital?: Better (feeling better) Any questions or concerns?: No  Items Reviewed: Did you receive and understand the discharge instructions provided?: Yes Medications obtained,verified, and reconciled?:  (Reports that he will pick up his medications today.) Any new allergies since your discharge?: No Dietary orders reviewed?: Yes Type of Diet Ordered:: low carbs Do you have support at home?: Yes People in Home: sibling(s) Name of Support/Comfort Primary Source: Brother - Donald  Medications Reviewed Today: Medications Reviewed Today     Reviewed by Earlie Server, RN (Registered Nurse) on 08/22/23 at 1407  Med List Status: <None>   Medication Order Taking? Sig Documenting Provider Last Dose Status Informant  ALPRAZolam (XANAX) 1 MG tablet [id number] Yes Take 1 mg by mouth 2 (two) times daily as needed. [provider] Taking Active   amLODipine (NORVASC) 10 MG tablet [id number] Yes Take 1 tablet (10 mg total) by mouth daily. Kathlen Mody, MD Taking Active   ARIPiprazole (ABILIFY) 5 MG tablet [id number] No Take 5 mg by mouth daily.  Patient not taking: Reported on 08/22/2023   [provider] Not Taking Active   atorvastatin (LIPITOR) 80 MG tablet [id number] No Take 1 tablet (80 mg total) by mouth daily at 6 PM.  Patient not taking: Reported on 08/22/2023   Kristian Covey, MD Not Taking Active    buPROPion (WELLBUTRIN XL) 300 MG 24 hr tablet [id number] Yes Take 300 mg by mouth every morning. [provider] Taking Active   carvedilol (COREG) 25 MG tablet [id number] No Take 1 tablet (25 mg total) by mouth 2 (two) times daily with a meal.  Patient not taking: Reported on 08/22/2023   Kathlen Mody, MD Not Taking Active   Continuous Blood Gluc Sensor (DEXCOM G6 SENSOR) MISC [id number] No One device as instructed  Patient not taking: Reported on 08/22/2023   Kristian Covey, MD Not Taking Active   Continuous Blood Gluc Transmit (DEXCOM G6 TRANSMITTER) MISC [id number] No One transmitter every 10 days  Patient not taking: Reported on 08/22/2023   Kristian Covey, MD Not Taking Active   dapagliflozin propanediol (FARXIGA) 10 MG TABS tablet [id number] No Take 1 tablet (10 mg total) by mouth daily before breakfast.  Patient not taking: Reported on 08/22/2023   Kathlen Mody, MD Not Taking Active   ezetimibe (ZETIA) 10 MG tablet [id number] No TAKE 1 TABLET BY MOUTH EVERY DAY  Patient not taking: Reported on 08/22/2023   Kristian Covey, MD Not Taking Active   gabapentin (NEURONTIN) 300 MG capsule [id number] Yes Take 100 mg by mouth 2 (two) times daily. Take 2 capsules 2 times per day. [provider] Taking Active   insulin aspart (NOVOLOG) 100 UNIT/ML injection [id number] No Inject 10 Units into the skin 3 (three) times daily with meals.  Patient not taking: Reported on 08/22/2023   [provider] Not Taking Active   Insulin Glargine w/ Trans Port (BASAGLAR TEMPO PEN) 100 UNIT/ML SOPN [id number] No Inject 30 Units into the skin daily. Inject 30  units into the skin daily.  Patient not taking: Reported on 08/22/2023   Kristian Covey, MD Not Taking Active   losartan-hydrochlorothiazide [facility]) 100-12.5 MG tablet [id number] Yes TAKE 1 TABLET BY MOUTH EVERY DAY Burchette, Elberta Fortis, MD Taking Active   metFORMIN (GLUCOPHAGE) 500 MG tablet [id number] No Take 2 tablets (1,000  mg total) by mouth 2 (two) times daily.  Patient not taking: Reported on 08/22/2023   Kathlen Mody, MD Not Taking Active            Med Note (ROSE, Shruti Arrey U   Wed Aug 22, 2023  2:04 PM) Going to get RX today.  metoCLOPramide (REGLAN) 10 MG tablet [id number] Yes Take 10 mg by mouth 3 (three) times daily before meals. For 15 days [provider]  Active            Med Note (ROSE, Debany Vantol U   Wed Aug 22, 2023  2:05 PM) Will pick up RX today  nitroGLYCERIN (NITROSTAT) 0.4 MG SL tablet [id number] No Place 1 tablet (0.4 mg total) under the tongue every 5 (five) minutes as needed for chest pain.  Patient not taking: Reported on 08/22/2023   Kathlen Mody, MD Not Taking Active   ondansetron [facility]) 4 MG tablet [id number] Yes Take 4 mg by mouth every 8 (eight) hours as needed for nausea or vomiting. [provider]  Active            Med Note (ROSE, Tyge Somers U   Wed Aug 22, 2023  2:05 PM) Will pick up RX today.   ondansetron (ZOFRAN-ODT) 8 MG disintegrating tablet [id number] No Take 1 tablet (8 mg total) by mouth every 8 (eight) hours as needed for nausea or vomiting.  Patient not taking: Reported on 08/22/2023   Kristian Covey, MD Not Taking Active   oxycodone (OXY-IR) 5 MG capsule [id number] Yes Take 5 mg by mouth every 4 (four) hours as needed for pain. For 3 days [provider]  Active            Med Note (ROSE, Lanell Matar   Wed Aug 22, 2023  2:06 PM) Will pick up today  pantoprazole (PROTONIX) 40 MG tablet [id number] Yes TAKE 1 TABLET BY MOUTH TWICE A DAY Hilarie Fredrickson, MD Taking Active             Home Care and Equipment/Supplies: Were Home Health Services Ordered?: No Any new equipment or medical supplies ordered?: No  Functional Questionnaire: Do you need assistance with bathing/showering or dressing?: No Do you need assistance with meal preparation?: No Do you need assistance with eating?: No Do you have difficulty maintaining continence: No Do you need  assistance with getting out of bed/getting out of a chair/moving?: No Do you have difficulty managing or taking your medications?: No  Follow up appointments reviewed: PCP Follow-up appointment confirmed?:  (patient states that he is requesting through My Chart) MD Provider Line Number:[phone] Given: No Specialist Hospital Follow-up appointment confirmed?: NA Do you need transportation to your follow-up appointment?: No Do you understand care options if your condition(s) worsen?: Yes-patient verbalized understanding  SDOH Interventions Today    Flowsheet Row Most Recent Value  SDOH Interventions   Food Insecurity Interventions Intervention Not Indicated  Housing Interventions Intervention Not Indicated  Transportation Interventions Intervention Not Indicated  Utilities Interventions Intervention Not Indicated       Goals      TOC- Patient will report no readmissions for the next 30 days.  Current Barriers:  Medication access Patient does not have newly prescribed medications Medication management Patient has not been taking his medications as prescribed due to being out. Diet/Nutrition/Food Resources Patient states that he needs assistance with knowing what to eat on his DM diet Provider appointments No PCP follow up scheduled as of yet.   RNCM Clinical Goal(s):  Patient will work with the Care Management team over the next 30 days to address Transition of Care Barriers: Medication access Medication Management Diet/Nutrition/Food Resources Provider appointments take all medications exactly as prescribed and will call provider for medication related questions as evidenced by patient report attend all scheduled medical appointments: for PCP and specialist as evidenced by patient report and review of EMR  through collaboration with RN Care manager, provider, and care team.   Interventions: Evaluation of current treatment plan related to  self management and patient's  adherence to plan as established by provider  Transitions of Care:  New goal. Doctor Visits  - discussed the importance of doctor visits Encouraged patient to make himself an appointment. Reports that he has requested through [facility] CHART. Reviewed importance of reporting any reoccurring NV to MD Reviewed all medications and reasons for taking medications.  Encouraged patient to self monitor CBG and report abnormal readings to MD  Diabetes Interventions:  (Status:  New goal.) Short Term Goal Assessed patient's understanding of A1c goal: <7% Provided education to patient about basic DM disease process Reviewed medications with patient and discussed importance of medication adherence Counseled on importance of regular laboratory monitoring as prescribed Discussed plans with patient for ongoing care management follow up and provided patient with direct contact information for care management team Reviewed scheduled/upcoming provider appointments including: Patient is currently scheduling follow up.  Advised patient, providing education and rationale, to check cbg as per MD instructions and record, calling MD for findings outside established parameters Review of patient status, including review of consultants reports, relevant laboratory and other test results, and medications completed Assessed social determinant of health barriers Encouraged patient to self monitor and pick up his supplies.  Lab Results  Component Value Date   HGBA1C 12.2 (A) 07/28/2022    Patient Goals/Self-Care Activities: Participate in Transition of Care Program/Attend [facility] scheduled calls Notify RN Care Manager of TOC call rescheduling needs Take all medications as prescribed Attend all scheduled provider appointments Call pharmacy for medication refills 3-7 days in advance of running out of medications Perform all self care activities independently  Call provider office for new concerns or questions  check feet daily  for cuts, sores or redness take the blood sugar log to all doctor visits trim toenails straight across drink 6 to 8 glasses of water each day wash and dry feet carefully every day wear comfortable, cotton socks Call MD for any changes in condition  Follow Up Plan:  Telephone follow up appointment with care management team member scheduled for:  08/29/2023 The patient has been provided with contact information for the care management team and has been advised to call with any health related questions or concerns.        Discharged home from [place] after SBO. Patient denies NV today and denies pain. Reports that he is recovering. Reports that his brother is assisting him. States that he will pick up his prescriptions today and will also pick up his DM supplies and start checking blood sugar.  Reviewed importance of closely monitoring CBG.  Encouraged patient to take take his medications as prescribed.  Reviewed and offered  30 day TOC program and patient has consented. Follow up planned for 1 week.   Lonia Chimera, RN, BSN, CEN [employer]- Transition of Care Team.  [facility] [phone]

## 2023-08-29 ENCOUNTER — Other Ambulatory Visit: Payer: Self-pay

## 2023-08-29 NOTE — Patient Outreach (Signed)
 Care Management  Transitions of Care Program Transitions of Care Post-discharge week 2  08/29/2023 Name: Kirk Barker MRN: [id number] DOB: [birth year]  Subjective: Kirk Barker is a 48 y.o. year old male who is a primary care patient of Burchette, Elberta Fortis, MD. The Care Management team was unable to reach the patient by phone to assess and address transitions of care needs.   Plan: Additional outreach attempts will be made to reach the patient enrolled in the [facility] Program (Post Inpatient/ED Visit).  Lonia Chimera, RN, BSN, CEN [employer]- Transition of Care Team.  [facility] [phone]

## 2023-08-30 ENCOUNTER — Telehealth: Payer: Self-pay

## 2023-08-30 NOTE — Patient Outreach (Signed)
 Care Management  Transitions of Care Program Transitions of Care Post-discharge week 2   08/30/2023 Name: Kirk Barker MRN: [id number] DOB: 1975/10/17  Subjective: Kirk Barker is a 48 y.o. year old male who is a primary care patient of Burchette, Elberta Fortis, MD. The Care Management team Engaged with patient Engaged with patient by telephone to assess and address transitions of care needs.   Consent to Services:  Patient was given information about care management services, agreed to services, and gave verbal consent to participate.   Assessment: Reports that " I am healing"  Reports that he is not checking CBG due to no machine.  Reports that he missed his appointment due to being so sick. Reports that he has diarrhea every day.  Reports that he has been on metformin in the past without diarrhea.  Reports nausea at night and feeling bloated. Reports lots of gas.  Denies use of recreational drugs at this time.          SDOH Interventions    Flowsheet Row Telephone from 08/22/2023 in [place] POPULATION HEALTH DEPARTMENT  SDOH Interventions   Food Insecurity Interventions Intervention Not Indicated  Housing Interventions Intervention Not Indicated  Transportation Interventions Intervention Not Indicated  Utilities Interventions Intervention Not Indicated        Goals Addressed             This Visit's Progress    TOC- Patient will report no readmissions for the next 30 days.       Current Barriers:  Medication access Patient does not have newly prescribed medications  08/30/2023 Patient reports that he does not have all his prescriptions. Reports he did not go to his follow up because he was so sick. Reports diarrhea and nausea. Is not self monitoring CBG because he does not have any supplies.  Medication management Patient has not been taking his medications as prescribed due to being out. 08/30/2023   see above. Has not been to MD to be seen in over 1 year.   Diet/Nutrition/Food Resources Patient states that he needs assistance with knowing what to eat on his DM diet.   Provider appointments No PCP follow up scheduled as of yet. 08/30/2023  Reports he did not go to appointment because he was sick.   RNCM Clinical Goal(s):  Patient will work with the Care Management team over the next 30 days to address Transition of Care Barriers: Medication access Medication Management Diet/Nutrition/Food Resources Provider appointments take all medications exactly as prescribed and will call provider for medication related questions as evidenced by patient report attend all scheduled medical appointments: for PCP and specialist as evidenced by patient report and review of EMR  through collaboration with RN Care manager, provider, and care team.   Interventions: Evaluation of current treatment plan related to  self management and patient's adherence to plan as established by provider  Transitions of Care:  Goal on track:  NO. Doctor Visits  - discussed the importance of doctor visits Encouraged patient to make himself an appointment. Reports that he has requested through [facility] CHART. Reviewed importance of reporting any reoccurring NV to MD Reviewed all medications and reasons for taking medications.  Encouraged patient to self monitor CBG and report abnormal readings to MD All above interventions were reviewed again today.  Discussed with patient that he has not seen PCP in over a year. Reviewed current A1c of 12.2  Diabetes Interventions:  (Status:  Goal on track:  NO.) Short Term  Goal Assessed patient's understanding of A1c goal: <7% Provided education to patient about basic DM disease process Reviewed medications with patient and discussed importance of medication adherence Reviewed side effects of Metformin.  Discussed plans with patient for ongoing care management follow up and provided patient with direct contact information for care management  team Reviewed scheduled/upcoming provider appointments including: encouraged patient to reschedule his appointment that he missed.  Reviewed importance of getting DM under control.  Reviewed organ damage that occurs with high A1c.  Advised patient, providing education and rationale, to check cbg as per MD instructions and record, calling MD for findings outside established parameters Encouraged patient to self monitor and pick up his supplies.  Lab Results  Component Value Date   HGBA1C 12.2 (A) 07/28/2022    Patient Goals/Self-Care Activities: Participate in Transition of Care Program/Attend [facility] scheduled calls Notify RN Care Manager of TOC call rescheduling needs Take all medications as prescribed Attend all scheduled provider appointments Call pharmacy for medication refills 3-7 days in advance of running out of medications Perform all self care activities independently  Call provider office for new concerns or questions  check feet daily for cuts, sores or redness take the blood sugar log to all doctor visits trim toenails straight across drink 6 to 8 glasses of water each day wash and dry feet carefully every day wear comfortable, cotton socks Call MD for any changes in condition I will call and reschedule my PCP appointment  Follow Up Plan:  Telephone follow up appointment with care management team member scheduled for:  09/05/2023 The patient has been provided with contact information for the care management team and has been advised to call with any health related questions or concerns.          Plan: Telephone follow up appointment with care management team member scheduled for: 09/05/2023.  Reviewed at great length importance of patient to see PCP to get his RX and CBG supplies. ( Last office visit greater than 1 year)  Lonia Chimera, RN, BSN, CEN Population Health- Transition of Care Team.  [facility] [phone]

## 2023-09-05 ENCOUNTER — Other Ambulatory Visit: Payer: Self-pay

## 2023-09-05 NOTE — Patient Outreach (Signed)
 Care Management  Transitions of Care Program Transitions of Care Post-discharge week 3  09/05/2023 Name: Kirk Barker MRN: [id number] DOB: May 11, 1976  Subjective: Kirk Barker is a 48 y.o. year old male who is a primary care patient of Burchette, Elberta Fortis, MD. The Care Management team was unable to reach the patient by phone to assess and address transitions of care needs.   Plan: Additional outreach attempts will be made to reach the patient enrolled in the [facility] Program (Post Inpatient/ED Visit).  Lonia Chimera, RN, BSN, CEN [employer]- Transition of Care Team.  [facility] [phone]

## 2023-09-06 ENCOUNTER — Telehealth: Payer: Self-pay

## 2023-09-06 NOTE — Patient Outreach (Signed)
 Care Management  Transitions of Care Program Transitions of Care Post-discharge week 3  09/06/2023 Name: Kirk Barker MRN: [id number] DOB: 1975-07-19  Subjective: Kirk Barker is a 48 y.o. year old male who is a primary care patient of Burchette, Elberta Fortis, MD. The Care Management team was unable to reach the patient by phone to assess and address transitions of care needs.   Plan: No further outreach attempts will be made at this time.  We have been unable to reach the patient. Lonia Chimera, RN, BSN, CEN [employer]- Transition of Care Team.  [facility] [phone]

## 2023-10-09 ENCOUNTER — Other Ambulatory Visit: Payer: Self-pay | Admitting: Family Medicine

## 2023-10-10 ENCOUNTER — Ambulatory Visit: Admitting: Family Medicine

## 2023-10-15 DIAGNOSIS — F419 Anxiety disorder, unspecified: Secondary | ICD-10-CM | POA: Diagnosis not present

## 2023-10-15 DIAGNOSIS — F338 Other recurrent depressive disorders: Secondary | ICD-10-CM | POA: Diagnosis not present

## 2023-10-19 ENCOUNTER — Other Ambulatory Visit: Payer: Self-pay | Admitting: Family Medicine

## 2023-10-19 ENCOUNTER — Ambulatory Visit: Admitting: Family Medicine

## 2023-10-19 ENCOUNTER — Ambulatory Visit (INDEPENDENT_AMBULATORY_CARE_PROVIDER_SITE_OTHER): Admitting: Family Medicine

## 2023-10-19 VITALS — BP 138/90 | HR 107 | Temp 98.3°F | Wt 201.9 lb

## 2023-10-19 DIAGNOSIS — I2510 Atherosclerotic heart disease of native coronary artery without angina pectoris: Secondary | ICD-10-CM | POA: Diagnosis not present

## 2023-10-19 DIAGNOSIS — Z794 Long term (current) use of insulin: Secondary | ICD-10-CM

## 2023-10-19 DIAGNOSIS — E1159 Type 2 diabetes mellitus with other circulatory complications: Secondary | ICD-10-CM | POA: Diagnosis not present

## 2023-10-19 DIAGNOSIS — E7800 Pure hypercholesterolemia, unspecified: Secondary | ICD-10-CM

## 2023-10-19 DIAGNOSIS — I255 Ischemic cardiomyopathy: Secondary | ICD-10-CM | POA: Diagnosis not present

## 2023-10-19 MED ORDER — DEXCOM G6 SENSOR MISC: 3 refills | Status: DC

## 2023-10-19 MED ORDER — DEXCOM G6 TRANSMITTER MISC: 3 refills | Status: DC

## 2023-10-19 MED ORDER — TELMISARTAN 40 MG PO TABS: 40.0000 mg | ORAL_TABLET | Freq: Every day | ORAL | 3 refills | Status: DC

## 2023-10-19 MED ORDER — ATORVASTATIN CALCIUM 80 MG PO TABS: 80.0000 mg | ORAL_TABLET | Freq: Every day | ORAL | 3 refills | Status: AC

## 2023-10-19 MED ORDER — DAPAGLIFLOZIN PROPANEDIOL 5 MG PO TABS: 5.0000 mg | ORAL_TABLET | Freq: Every day | ORAL | 3 refills | Status: AC

## 2023-10-19 NOTE — Patient Instructions (Signed)
 I will be sending in script for Dexcom monitor  I will set up follow up with our clinical pharmacist  I will set up repeat echocardiogram  Get back on all regular medications regularly  Set up office follow up in about 6 weeks.    Start aspirin  81 mg once daily

## 2023-10-19 NOTE — Progress Notes (Signed)
 Established Patient Office Visit  Subjective   [name], male    DOB: 01/16/1976  Age: 48 y.o. MRN: [id number]  Chief Complaint  Patient presents with   Medical Management of Chronic Issues    HPI   Kirk Barker is seen for hospital follow-up from recent admission back sometime in March at [facility].  We do not have discharge summary.  He has complicated past medical history including history of CAD, hypertension, ischemic cardiomyopathy, history of non-ST elevation MI, type 2 diabetes, GERD, reported pancreatitis, dyslipidemia, chronic epigastric abdominal pain.  Longstanding history of poor compliance with medications.  He states he is ready to get things "back on track ".  He apparently went into [facility] for [facility] with chronic epigastric abdominal pain.  Blood sugars were noted to be very high and he was admitted.  Not clear if he was in DKA.  Lipase was normal.  A1c 11.2.  Patient states he was discharged on metformin  500 mg 2 twice daily, gabapentin , and Protonix  but no other medications.  He does have history of ischemic cardiomyopathy and coronary disease had been on high-dose statin as well as several other medications including Farxiga  and carvedilol  for his heart failure.  Also had been on ARB previously.  Is currently on none of those medications.  Denies any dyspnea with activity.  No chest pains.  He states he is trying to clean up his diet.  He does have pending follow-up with nutritionist which is covered by his current insurance.  He has used Dexcom previously and would like to get that started back if possible.  He currently does not have any way of monitoring his blood sugars.  He states has had very stressful year with going through divorce.  He attributes some of his poor compliance to that.  He is requesting completion of FMLA papers which have been done previously.  He states he has to take frequent bathroom breaks which usually take 10 to  15 minutes and increase frequency of breaks through the day secondary to his chronic GI issues.  Past Medical History:  Diagnosis Date   Median arcuate ligament syndrome [facility])    Past Surgical History:  Procedure Laterality Date   CHOLECYSTECTOMY     LEFT HEART CATH AND CORONARY ANGIOGRAPHY N/A 09/15/2019   Procedure: LEFT HEART CATH AND CORONARY ANGIOGRAPHY;  Surgeon: Arty Binning, MD;  Location: [facility] INVASIVE CV LAB;  Service: Cardiovascular;  Laterality: N/A;    reports that he quit smoking about 4 years ago. His smoking use included cigarettes. He has never used smokeless tobacco. He reports current alcohol use. He reports that he does not use drugs. family history includes Brain cancer in his maternal grandmother; Diabetes in his father; Stroke in his mother. Allergies  Allergen Reactions   Bee Venom    Cat Dander Anxiety, Itching and Other (See Comments)    Itching , watering eyes  Itching , watering eyes  Itching , watering eyes  Itching , watering eyes     Other Itching    Review of Systems  Constitutional:  Negative for chills, fever and malaise/fatigue.  Eyes:  Negative for blurred vision.  Respiratory:  Negative for shortness of breath.   Cardiovascular:  Negative for chest pain.  Gastrointestinal:  Negative for blood in stool and melena.  Neurological:  Negative for dizziness, weakness and headaches.      Objective:     BP (!) 138/90 (BP Location: Left Arm, Cuff  Size: Normal)   Pulse (!) 107   Temp 98.3 F (36.8 C) (Oral)   Wt 201 lb 14.4 oz (91.6 kg)   SpO2 97%   BMI 27.38 kg/m  BP Readings from Last 3 Encounters:  10/19/23 (!) 138/90  11/27/22 126/84  07/28/22 (!) 140/86   Wt Readings from Last 3 Encounters:  10/19/23 201 lb 14.4 oz (91.6 kg)  11/27/22 207 lb (93.9 kg)  07/28/22 216 lb 11.2 oz (98.3 kg)      Physical Exam Vitals reviewed.  Constitutional:      General: He is not in acute distress.    Appearance: He is well-developed. He is  not ill-appearing.  Eyes:     Pupils: Pupils are equal, round, and reactive to light.  Neck:     Thyroid: No thyromegaly.  Cardiovascular:     Rate and Rhythm: Normal rate and regular rhythm.  Pulmonary:     Effort: Pulmonary effort is normal. No respiratory distress.     Breath sounds: Normal breath sounds. No wheezing or rales.  Musculoskeletal:     Cervical back: Neck supple.     Right lower leg: No edema.     Left lower leg: No edema.  Neurological:     Mental Status: He is alert and oriented to person, place, and time.      No results found for any visits on 10/19/23.    The ASCVD Risk score (Arnett DK, et al., 2019) failed to calculate for the following reasons:   Risk score cannot be calculated because patient has a medical history suggesting prior/existing ASCVD    Assessment & Plan:   #1 history of type 2 diabetes.  History of very poor control.  Poor compliance with medications.  Recent A1c 11.2.  Patient currently on metformin  alone.  Was just started back on this couple months ago.  Start back Farxiga  5 mg once daily.  Set up future labs including A1c in about 1 month Obtain urine microalbumin screen at follow-up Continue yearly diabetic eye exam - Will also set up with our clinical pharmacist to try to assist with disease management and possibly medication assistance - Sending in refills for his Dexcom - He is already scheduled to have virtual visit with nutritionist through his company and we strongly support that  #2 hypertension.  Currently untreated.  Start Micardis 40 mg once daily.  Recheck labs including comprehensive metabolic panel in approximately 1 month  #3 history of CAD and ischemic cardiomyopathy.  Patient currently not following up with cardiology and will likely recommend reestablishing soon. -Start back aspirin  81 mg daily - Refill atorvastatin  80 mg daily which he has tolerated well in the past - Set up repeat echocardiogram to reassess  ischemic cardiomyopathy.  Last echo was 2021 - Depending on echo results may need to be on further medication  such as carvedilol  - Future lab order in approximately 1 month to obtain lipid and CMP  #4 chronic epigastric pains.  Patient currently on Protonix  40 mg twice daily.  Continue close follow-up with GI.  Over 40 minutes were spent in face-to-face and non-face-to-face time reviewing recent records and medications.  #5 past history of depression currently stable off medication   Return in about 6 weeks (around 11/30/2023).    Glean Lamy, MD

## 2023-10-22 DIAGNOSIS — F411 Generalized anxiety disorder: Secondary | ICD-10-CM | POA: Diagnosis not present

## 2023-10-22 DIAGNOSIS — F331 Major depressive disorder, recurrent, moderate: Secondary | ICD-10-CM | POA: Diagnosis not present

## 2023-10-22 DIAGNOSIS — F410 Panic disorder [episodic paroxysmal anxiety] without agoraphobia: Secondary | ICD-10-CM | POA: Diagnosis not present

## 2023-10-23 ENCOUNTER — Other Ambulatory Visit: Payer: Self-pay | Admitting: Family Medicine

## 2023-10-25 NOTE — Telephone Encounter (Signed)
This is Dr Mar Daring pt

## 2023-10-30 ENCOUNTER — Encounter (HOSPITAL_BASED_OUTPATIENT_CLINIC_OR_DEPARTMENT_OTHER): Payer: Self-pay

## 2023-11-02 ENCOUNTER — Telehealth: Payer: Self-pay | Admitting: *Deleted

## 2023-11-02 NOTE — Progress Notes (Signed)
 Care Guide Pharmacy Note  11/02/2023 Name: Kirk Barker MRN: [id number] DOB: [birth year]  Referred By: Marquetta Sit, MD Reason for referral: Complex Care Management (Outreach to schedule referral with pharmacist )   Kirk Barker is a 48 y.o. year old male who is a primary care patient of Burchette, Marijean Shouts, MD.  Kirk Barker was referred to the pharmacist for assistance related to: DMII  Successful contact was made with the patient to discuss pharmacy services including being ready for the pharmacist to call at least 5 minutes before the scheduled appointment time and to have medication bottles and any blood pressure readings ready for review. The patient agreed to meet with the pharmacist via telephone visit on 11/12/2023  Kandis Ormond, CMA [place]  [facility], [facility] Guide Direct Dial: [phone]  Fax: [phone] Website: [email or web address]

## 2023-11-07 ENCOUNTER — Encounter (HOSPITAL_BASED_OUTPATIENT_CLINIC_OR_DEPARTMENT_OTHER): Payer: Self-pay

## 2023-11-12 ENCOUNTER — Other Ambulatory Visit

## 2023-11-12 NOTE — Progress Notes (Deleted)
   11/12/2023 Name: Kirk Barker MRN: [id number] DOB: 1976/03/20  No chief complaint on file.   {Visit Type:26650}   Subjective:  Care Team: Primary Care Provider: Marquetta Sit, MD ; Next Scheduled Visit: *** {careteamprovider:27366}  Medication Access/Adherence  Current Pharmacy:  [phone], [place] - [address] [place] [address] Phone: [phone] Fax: [phone]  [id number] [address] [place] - [address] [place] [address] Phone: [phone] Fax: [phone]   Patient reports affordability concerns with their medications: {YES/NO:21197} Patient reports access/transportation concerns to their pharmacy: {YES/NO:21197} Patient reports adherence concerns with their medications:  {YES/NO:21197} ***   {Pharmacy S/O Choices:26420}   Objective:  Lab Results  Component Value Date   HGBA1C 12.2 (A) 07/28/2022    Lab Results  Component Value Date   CREATININE 1.37 (H) 03/25/2022   BUN 12 03/25/2022   NA 132 (L) 03/25/2022   K 4.1 03/25/2022   CL 97 (L) 03/25/2022   CO2 24 03/25/2022    Lab Results  Component Value Date   CHOL 184 01/06/2022   HDL 41 01/06/2022   LDLCALC 123 (H) 01/06/2022   TRIG 97 01/06/2022   CHOLHDL 4.5 01/06/2022    Medications Reviewed Today   Medications were not reviewed in this encounter       Assessment/Plan:   {Pharmacy A/P Choices:26421}  Follow Up Plan: ***  ***

## 2023-11-16 ENCOUNTER — Encounter (HOSPITAL_BASED_OUTPATIENT_CLINIC_OR_DEPARTMENT_OTHER): Payer: Self-pay | Admitting: Family Medicine

## 2023-11-19 ENCOUNTER — Other Ambulatory Visit

## 2023-11-19 NOTE — Progress Notes (Deleted)
   11/19/2023 Name: [name] MRN: [id number] DOB: Feb 04, 1976  Attempted to contact patient for scheduled appointment for medication management. Unable to leave voicemail  [name], PharmD Clinical Pharmacist [phone]

## 2023-11-22 ENCOUNTER — Telehealth: Payer: Self-pay | Admitting: *Deleted

## 2023-11-22 NOTE — Progress Notes (Signed)
 Complex Care Management Care Guide Note  11/22/2023 Name: Kirk Barker MRN: [id number] DOB: [birth year]  Kirk Barker is a 48 y.o. year old male who is a primary care patient of Burchette, Wolm ORN, MD and is actively engaged with the care management team. I reached out to Kirk Barker by phone today to assist with re-scheduling  with the Pharmacist.  Follow up plan: Unsuccessful telephone outreach attempt made. A HIPAA compliant phone message was left for the patient providing contact information and requesting a return call.  [name], CMA [place]  [facility], [facility] Guide Direct Dial: [phone]  Fax: [phone] Website: [email or web address]

## 2023-11-23 NOTE — Progress Notes (Signed)
 Complex Care Management Care Guide Note  11/23/2023 Name: Kirk Barker MRN: [id number] DOB: 1976-01-22  Kirk Barker is a 48 y.o. year old male who is a primary care patient of Burchette, Wolm ORN, MD and is actively engaged with the care management team. I reached out to Kirk Barker by phone today to assist with re-scheduling  with the Pharmacist.  Follow up plan: Unsuccessful telephone outreach attempt made. A HIPAA compliant phone message was left for the patient providing contact information and requesting a return call. No further outreach attempts will be made due to inability to maintain patient contact.   [name], CMA [place]  [facility], [facility] Guide Direct Dial: [phone]  Fax: [phone] Website: [email or web address]

## 2023-11-27 ENCOUNTER — Telehealth: Payer: Self-pay | Admitting: *Deleted

## 2023-11-27 NOTE — Progress Notes (Addendum)
 [facility] Quality Team Note  Name: Kirk Barker Date of Birth: 12/03/1975 MRN: [id number] Date: 11/27/2023  [facility] Quality Team has reviewed this patient's chart, please see recommendations below:  [facility] Quality Other; (CHART REVIEWED. PATIENT NEEDS A1C VALUE LESS THAN 8.0 AND URINE MICROALBUMIN/CREATININE RATIO FOR GAP CLOSURE.)  06/13/2024- COULD NOT CLOSE GSD 2025. ABSTRACTED KED LABS.

## 2023-11-27 NOTE — Progress Notes (Unsigned)
 Complex Care Management Care Guide Note  11/27/2023 Name: Kirk Barker MRN: [id number] DOB: [birth year]  Kirk Barker is a 48 y.o. year old male who is a primary care patient of Burchette, Wolm ORN, MD and is actively engaged with the care management team. I reached out to Kirk Barker by phone today to assist with re-scheduling  with the Pharmacist.  Follow up plan: Unsuccessful telephone outreach attempt made. A HIPAA compliant phone message was left for the patient providing contact information and requesting a return call.  [name], CMA [place]  [facility], [facility] Guide Direct Dial: [phone]  Fax: [phone] Website: [email or web address]

## 2023-11-28 NOTE — Progress Notes (Signed)
 Complex Care Management Care Guide Note  11/28/2023 Name: Kirk Barker MRN: [id number] DOB: 1975-12-17  Kirk Barker is a 48 y.o. year old male who is a primary care patient of Burchette, Wolm ORN, MD and is actively engaged with the care management team. I reached out to Kirk Barker by phone today to assist with re-scheduling  with the Pharmacist.  Follow up plan: Unsuccessful telephone outreach attempt made. A HIPAA compliant phone message was left for the patient providing contact information and requesting a return call. No further outreach attempts will be mad due to inability to maintain patient contact.   [name], CMA [place]  [facility], [facility] Guide Direct Dial: [phone]  Fax: [phone] Website: [email or web address]

## 2023-12-03 ENCOUNTER — Ambulatory Visit: Admitting: Family Medicine

## 2023-12-05 ENCOUNTER — Ambulatory Visit: Admitting: Family Medicine

## 2023-12-12 ENCOUNTER — Encounter: Payer: Self-pay | Admitting: Family Medicine

## 2023-12-12 ENCOUNTER — Ambulatory Visit (INDEPENDENT_AMBULATORY_CARE_PROVIDER_SITE_OTHER): Admitting: Family Medicine

## 2023-12-12 ENCOUNTER — Other Ambulatory Visit: Payer: Self-pay | Admitting: Family Medicine

## 2023-12-12 DIAGNOSIS — E1159 Type 2 diabetes mellitus with other circulatory complications: Secondary | ICD-10-CM | POA: Diagnosis not present

## 2023-12-12 DIAGNOSIS — Z7984 Long term (current) use of oral hypoglycemic drugs: Secondary | ICD-10-CM

## 2023-12-12 DIAGNOSIS — Z794 Long term (current) use of insulin: Secondary | ICD-10-CM | POA: Diagnosis not present

## 2023-12-12 DIAGNOSIS — I255 Ischemic cardiomyopathy: Secondary | ICD-10-CM

## 2023-12-12 DIAGNOSIS — E7800 Pure hypercholesterolemia, unspecified: Secondary | ICD-10-CM | POA: Diagnosis not present

## 2023-12-12 LAB — POCT GLYCOSYLATED HEMOGLOBIN (HGB A1C): Hemoglobin A1C: 11 % — AB (ref 4.0–5.6)

## 2023-12-12 MED ORDER — TELMISARTAN-HCTZ 40-12.5 MG PO TABS: 1.0000 | ORAL_TABLET | Freq: Every day | ORAL | 3 refills | Status: AC

## 2023-12-12 MED ORDER — DEXCOM G7 SENSOR MISC: 0 refills | Status: DC

## 2023-12-12 MED ORDER — DEXCOM G7 RECEIVER DEVI: 0 refills | Status: DC

## 2023-12-12 NOTE — Progress Notes (Unsigned)
 Established Patient Office Visit  Subjective   [name], male    DOB: [birth year]  Age: 48 y.o. MRN: [id number]  No chief complaint on file.   HPI  {History (Optional):23778} Kirk Barker is seen for chronic medical follow-up.  He has history of CAD, hypertension, type 2 diabetes, possible pancreatitis, dyslipidemia.  Longstanding history of poor compliance.  He has also been somewhat poorly compliant with metformin  and atorvastatin  since last visit.  Last visit he had recent A1c 11.2.  We had started back Farxiga  in addition to his metformin .  He staying well-hydrated.  We also recommended Dexcom continuous glucose monitor.  He had very high blood pressure which was untreated we started Micardis  40 mg daily.  We also refilled his atorvastatin  which she apparently is not taking consistently and recommended aspirin  81 mg daily because of his past history of CAD.  He has history of ischemic cardiomyopathy with last echo 2021 and we had recommended follow-up.  Denies any recent chest pains.  Really needs to get back into see cardiology.  Past Medical History:  Diagnosis Date   Median arcuate ligament syndrome [facility])    Past Surgical History:  Procedure Laterality Date   CHOLECYSTECTOMY     LEFT HEART CATH AND CORONARY ANGIOGRAPHY N/A 09/15/2019   Procedure: LEFT HEART CATH AND CORONARY ANGIOGRAPHY;  Surgeon: Claudene Victory ORN, MD;  Location: [facility];  Service: Cardiovascular;  Laterality: N/A;    reports that he quit smoking about 4 years ago. His smoking use included cigarettes. He has never used smokeless tobacco. He reports current alcohol use. He reports that he does not use drugs. family history includes Brain cancer in his maternal grandmother; Diabetes in his father; Stroke in his mother. Allergies  Allergen Reactions   Bee Venom    Cat Dander Anxiety, Itching and Other (See Comments)    Itching , watering eyes  Itching , watering eyes  Itching , watering  eyes  Itching , watering eyes     Other Itching    Review of Systems  Constitutional:  Negative for chills, fever and malaise/fatigue.  Eyes:  Negative for blurred vision.  Respiratory:  Negative for shortness of breath.   Cardiovascular:  Negative for chest pain.  Gastrointestinal:  Negative for abdominal pain.  Neurological:  Negative for dizziness, weakness and headaches.      Objective:     There were no vitals taken for this visit. {Vitals History (Optional):23777}  Physical Exam Vitals reviewed.  Constitutional:      General: He is not in acute distress.    Appearance: He is well-developed. He is not ill-appearing.  Eyes:     Pupils: Pupils are equal, round, and reactive to light.  Neck:     Thyroid: No thyromegaly.  Cardiovascular:     Rate and Rhythm: Normal rate and regular rhythm.  Pulmonary:     Effort: Pulmonary effort is normal. No respiratory distress.     Breath sounds: Normal breath sounds. No wheezing or rales.  Musculoskeletal:     Cervical back: Neck supple.  Neurological:     Mental Status: He is alert and oriented to person, place, and time.      Results for orders placed or performed in visit on 12/12/23  POC HgB A1c  Result Value Ref Range   Hemoglobin A1C 11.0 (A) 4.0 - 5.6 %   HbA1c POC (<> result, manual entry)     HbA1c, POC (prediabetic range)  HbA1c, POC (controlled diabetic range)      {Labs (Optional):23779}  The ASCVD Risk score (Arnett DK, et al., 2019) failed to calculate for the following reasons:   Risk score cannot be calculated because patient has a medical history suggesting prior/existing ASCVD    Assessment & Plan:   Problem List Items Addressed This Visit       Unprioritized   Type 2 diabetes mellitus with circulatory disorder, with long-term current use of insulin  (HCC) - Primary   Relevant Medications   telmisartan -hydrochlorothiazide (MICARDIS  HCT) 40-12.5 MG tablet   Other Relevant Orders   POC HgB  A1c (Completed)   Ischemic cardiomyopathy   Relevant Medications   telmisartan -hydrochlorothiazide (MICARDIS  HCT) 40-12.5 MG tablet   Pure hypercholesterolemia   Relevant Medications   telmisartan -hydrochlorothiazide (MICARDIS  HCT) 40-12.5 MG tablet  Patient has history of very poorly controlled type 2 diabetes.  He has what sounds like history of pancreatitis and is not a candidate for GLP-1 medications.  Currently on metformin  and Farxiga  but has not been taking metformin  consistently.  We suggested the following regarding his diabetes  -Continue metformin  and Farxiga  but will have to be careful with SGLT2 use with past history of possible DKA - Start Toujeo  insulin  10 units subcutaneous once daily - New prescription for Dexcom 7 continuous glucose monitor - Set up referral to our clinical pharmacist to go over continuous glucose monitoring and to discuss medication treatments for his diabetes  -We also discussed stopping his plain Micardis  and starting Micardis  HCTZ 40/12.5 mg for suboptimal control blood pressure today. - Get back on regular atorvastatin  80 mg daily - Check further labs today including lipid, CMP, urine microalbumin screen--although his lipids will likely be up since he has not been consistently taking atorvastatin .  Return in about 3 months (around 03/13/2024).    Wolm Scarlet, MD

## 2023-12-12 NOTE — Patient Instructions (Signed)
 Stop the plain Micardis  and start Micardis  hydrochlorothiazide  Start Toujeo  10 units [place] once daily.  I will set up referral to our clinical pharmacist.  I will order Dexcom 7.

## 2023-12-13 LAB — LIPID PANEL
Cholesterol: 165 mg/dL (ref 0–200)
HDL: 40.9 mg/dL (ref >39.00)
LDL Cholesterol: 105 mg/dL — ABNORMAL HIGH (ref 0–99)
NonHDL: 124.31
Total CHOL/HDL Ratio: 4
Triglycerides: 99 mg/dL (ref 0.0–149.0)
VLDL: 19.8 mg/dL (ref 0.0–40.0)

## 2023-12-13 LAB — COMPREHENSIVE METABOLIC PANEL WITH GFR
ALT: 16 U/L (ref 0–53)
AST: 16 U/L (ref 0–37)
Albumin: 4.3 g/dL (ref 3.5–5.2)
Alkaline Phosphatase: 85 U/L (ref 39–117)
BUN: 12 mg/dL (ref 6–23)
CO2: 23 meq/L (ref 19–32)
Calcium: 9.6 mg/dL (ref 8.4–10.5)
Chloride: 99 meq/L (ref 96–112)
Creatinine, Ser: 1.31 mg/dL (ref 0.40–1.50)
GFR: 63.57 mL/min (ref >60.00)
Glucose, Bld: 321 mg/dL — ABNORMAL HIGH (ref 70–99)
Potassium: 4.1 meq/L (ref 3.5–5.1)
Sodium: 133 meq/L — ABNORMAL LOW (ref 135–145)
Total Bilirubin: 0.4 mg/dL (ref 0.2–1.2)
Total Protein: 7.6 g/dL (ref 6.0–8.3)

## 2023-12-13 LAB — MICROALBUMIN / CREATININE URINE RATIO
Creatinine,U: 239 mg/dL
Microalb Creat Ratio: 240.2 mg/g — ABNORMAL HIGH (ref 0.0–30.0)
Microalb, Ur: 57.4 mg/dL — ABNORMAL HIGH (ref 0.0–1.9)

## 2023-12-17 ENCOUNTER — Ambulatory Visit: Payer: Self-pay | Admitting: Family Medicine

## 2023-12-17 DIAGNOSIS — F331 Major depressive disorder, recurrent, moderate: Secondary | ICD-10-CM | POA: Diagnosis not present

## 2023-12-17 DIAGNOSIS — F410 Panic disorder [episodic paroxysmal anxiety] without agoraphobia: Secondary | ICD-10-CM | POA: Diagnosis not present

## 2023-12-17 DIAGNOSIS — F411 Generalized anxiety disorder: Secondary | ICD-10-CM | POA: Diagnosis not present

## 2023-12-24 ENCOUNTER — Telehealth: Payer: Self-pay | Admitting: *Deleted

## 2023-12-24 NOTE — Telephone Encounter (Signed)
 Patient was identified as falling into the [place] Measure - Diabetes.   Patient was: Requires a call back at a later time. Attempted to call the patient, but voicemail is not set up yet.

## 2023-12-24 NOTE — Progress Notes (Unsigned)
 Care Guide Pharmacy Note  12/24/2023 Name: RANSON BELLUOMINI MRN: [id number] DOB: Oct 28, 1975  Referred By: Micheal Wolm ORN, MD Reason for referral: Call Attempt #1 and Complex Care Management (Outreach to schedule referral with pharmacist )   Darlynn JONELLE Molt is a 48 y.o. year old male who is a primary care patient of Burchette, Wolm ORN, MD.  Darlynn JONELLE Molt was referred to the pharmacist for assistance related to: DMII  An unsuccessful telephone outreach was attempted today to contact the patient who was referred to the pharmacy team for assistance with medication management. Additional attempts will be made to contact the patient.  [name], CMA [place]  [facility], [facility] Guide Direct Dial: [phone]  Fax: [phone] Website: [email or web address]

## 2023-12-25 NOTE — Progress Notes (Unsigned)
 Care Guide Pharmacy Note  12/25/2023 Name: Kirk Barker MRN: [id number] DOB: 1975/08/22  Referred By: Micheal Wolm ORN, MD Reason for referral: Call Attempt #1 and Complex Care Management (Outreach to schedule referral with pharmacist )   Kirk Barker is a 48 y.o. year old male who is a primary care patient of Burchette, Wolm ORN, MD.  Kirk Barker was referred to the pharmacist for assistance related to: DMII  A second unsuccessful telephone outreach was attempted today to contact the patient who was referred to the pharmacy team for assistance with medication management. Additional attempts will be made to contact the patient.  [name], CMA [place]  [facility], [facility] Guide Direct Dial: [phone]  Fax: [phone] Website: [email or web address]

## 2023-12-26 NOTE — Progress Notes (Signed)
 Care Guide Pharmacy Note  12/26/2023 Name: Kirk Barker MRN: [id number] DOB: 1975-10-19  Referred By: Micheal Wolm ORN, MD Reason for referral: Call Attempt #1 and Complex Care Management (Outreach to schedule referral with pharmacist )   Kirk Barker is a 48 y.o. year old male who is a primary care patient of Burchette, Wolm ORN, MD.  Kirk Barker was referred to the pharmacist for assistance related to: DMII  A third unsuccessful telephone outreach was attempted today to contact the patient who was referred to the pharmacy team for assistance with medication management. The Population Health team is pleased to engage with this patient at any time in the future upon receipt of referral and should he/she be interested in assistance from the Population Health team.  [name], CMA [facility]  [facility], [facility] Guide Direct Dial: [phone]  Fax: [phone] Website: [email or web address]

## 2024-01-06 ENCOUNTER — Other Ambulatory Visit: Payer: Self-pay | Admitting: Family Medicine

## 2024-01-07 DIAGNOSIS — F411 Generalized anxiety disorder: Secondary | ICD-10-CM | POA: Diagnosis not present

## 2024-01-08 ENCOUNTER — Other Ambulatory Visit: Payer: Self-pay | Admitting: Family Medicine

## 2024-01-16 ENCOUNTER — Other Ambulatory Visit: Payer: Self-pay | Admitting: Family Medicine

## 2024-02-04 DIAGNOSIS — F411 Generalized anxiety disorder: Secondary | ICD-10-CM | POA: Diagnosis not present

## 2024-02-04 DIAGNOSIS — F330 Major depressive disorder, recurrent, mild: Secondary | ICD-10-CM | POA: Diagnosis not present

## 2024-03-18 ENCOUNTER — Telehealth: Payer: Self-pay

## 2024-03-18 NOTE — Telephone Encounter (Signed)
 Patient was identified as falling into the [place] Measure - Diabetes.   Patient was: Requires a call back at a later time. Voicemail was full and would not accept new messages. Will attempt again.

## 2024-03-26 ENCOUNTER — Other Ambulatory Visit: Payer: Self-pay | Admitting: Family Medicine

## 2024-04-17 DIAGNOSIS — F411 Generalized anxiety disorder: Secondary | ICD-10-CM | POA: Diagnosis not present

## 2024-04-17 DIAGNOSIS — F330 Major depressive disorder, recurrent, mild: Secondary | ICD-10-CM | POA: Diagnosis not present

## 2024-04-30 ENCOUNTER — Telehealth: Admitting: Family Medicine

## 2024-04-30 ENCOUNTER — Encounter: Payer: Self-pay | Admitting: Family Medicine

## 2024-04-30 VITALS — Wt 205.0 lb

## 2024-04-30 DIAGNOSIS — R1013 Epigastric pain: Secondary | ICD-10-CM

## 2024-04-30 DIAGNOSIS — G8929 Other chronic pain: Secondary | ICD-10-CM | POA: Diagnosis not present

## 2024-04-30 DIAGNOSIS — E1159 Type 2 diabetes mellitus with other circulatory complications: Secondary | ICD-10-CM

## 2024-04-30 DIAGNOSIS — Z794 Long term (current) use of insulin: Secondary | ICD-10-CM

## 2024-04-30 DIAGNOSIS — E7800 Pure hypercholesterolemia, unspecified: Secondary | ICD-10-CM

## 2024-04-30 NOTE — Progress Notes (Signed)
 [name], male   DOB: 10-Aug-1975, 48 y.o.   MRN: [id number]   Virtual Visit via Video Note  I connected with Kirk Barker on 04/30/24 at  1:00 PM EST by a video enabled telemedicine application and verified that I am speaking with the correct person using two identifiers.  Location patient: home Location provider:work or home office Persons participating in the virtual visit: patient, provider  I discussed the limitations of evaluation and management by telemedicine and the availability of in person appointments. The patient expressed understanding and agreed to proceed.   HPI:  Kirk Barker has chronic problems including history of CAD, hypertension, ischemic cardiomyopathy, type 2 diabetes poorly controlled, GERD, history of pancreatitis.  He called to set up virtual visit to discuss FMLA papers.  He has history of recurrent intermittent severe epigastric pains which have been worked up extensively previously by GI.  Etiology unclear.  This does disrupt his work at times.  He is asking for FMLA paper to cover for intermittent leaf.  He has very poorly controlled blood sugars and currently is on Farxiga , metformin , and basal insulin .  He does not recall the name of which insulin  is unknown if for some reason this is not on his med list currently.  He will call back to confirm.  He states he is visiting with nutritionist weekly.  He currently has no transportation as his car is being worked on.  He plans to schedule follow-up in January for reassessment.  He had significant urine microalbuminuria of 240 on urinalysis last visit.  We started telmisartan  at that time.  He states he has been compliant with medications since then.  Takes high-dose statin with atorvastatin  80 mg daily.   ROS: See pertinent positives and negatives per HPI.  Past Medical History:  Diagnosis Date   Median arcuate ligament syndrome     Past Surgical History:  Procedure Laterality Date   CHOLECYSTECTOMY      LEFT HEART CATH AND CORONARY ANGIOGRAPHY N/A 09/15/2019   Procedure: LEFT HEART CATH AND CORONARY ANGIOGRAPHY;  Surgeon: Claudene Victory ORN, MD;  Location: [facility];  Service: Cardiovascular;  Laterality: N/A;    Family History  Problem Relation Age of Onset   Stroke Mother    Diabetes Father    Brain cancer Maternal Grandmother    Colon cancer Neg Hx    Stomach cancer Neg Hx    Esophageal cancer Neg Hx     SOCIAL HX: Non-smoker.  Did quit 2021   Current Outpatient Medications:    ALPRAZolam  (XANAX ) 1 MG tablet, Take 1 mg by mouth 2 (two) times daily as needed., Disp: , Rfl:    atorvastatin  (LIPITOR ) 80 MG tablet, Take 1 tablet (80 mg total) by mouth daily., Disp: 90 tablet, Rfl: 3   Continuous Glucose Receiver (DEXCOM G7 RECEIVER) DEVI, USE AS DIRECTED TO CHECK BLOOD SUGAR CONTINUOUSLY, Disp: 3 each, Rfl: 2   Continuous Glucose Sensor (DEXCOM G7 SENSOR) MISC, USE AS DIRECTED TO CHECK BLOOD SUGAR CONTINUOUSLY, Disp: 3 each, Rfl: 3   Continuous Glucose Transmitter (DEXCOM G6 TRANSMITTER) MISC, One transmitter every 90 days, Disp: 4 each, Rfl: 1   dapagliflozin  propanediol (FARXIGA ) 5 MG TABS tablet, Take 1 tablet (5 mg total) by mouth daily before breakfast., Disp: 90 tablet, Rfl: 3   gabapentin  (NEURONTIN ) 300 MG capsule, TAKE 2 CAPSULES BY MOUTH 3 TIMES PER DAY, Disp: 180 capsule, Rfl: 5   metFORMIN  (GLUCOPHAGE ) 500 MG tablet, TAKE ONE TABLET BY  MOUTH 2 TIMES DAILY WITH MEALS., Disp: 180 tablet, Rfl: 3   nitroGLYCERIN  (NITROSTAT ) 0.4 MG SL tablet, Place 1 tablet (0.4 mg total) under the tongue every 5 (five) minutes as needed for chest pain., Disp: 25 tablet, Rfl: 1   ondansetron  (ZOFRAN ) 4 MG tablet, Take 4 mg by mouth every 8 (eight) hours as needed for nausea or vomiting., Disp: , Rfl:    ondansetron  (ZOFRAN -ODT) 8 MG disintegrating tablet, Take 1 tablet (8 mg total) by mouth every 8 (eight) hours as needed for nausea or vomiting., Disp: 15 tablet, Rfl: 0   pantoprazole   (PROTONIX ) 40 MG tablet, TAKE 1 TABLET BY MOUTH TWICE A DAY, Disp: 180 tablet, Rfl: 2   telmisartan -hydrochlorothiazide (MICARDIS  HCT) 40-12.5 MG tablet, Take 1 tablet by mouth daily., Disp: 90 tablet, Rfl: 3  EXAM:  VITALS per patient if applicable:  GENERAL: alert, oriented, appears well and in no acute distress  HEENT: atraumatic, conjunttiva clear, no obvious abnormalities on inspection of external nose and ears  NECK: normal movements of the head and neck  LUNGS: on inspection no signs of respiratory distress, breathing rate appears normal, no obvious gross SOB, gasping or wheezing  CV: no obvious cyanosis  MS: moves all visible extremities without noticeable abnormality  PSYCH/NEURO: pleasant and cooperative, no obvious depression or anxiety, speech and thought processing grossly intact  ASSESSMENT AND PLAN:  Discussed the following assessment and plan:  #1 longstanding history of intermittent epigastric abdominal pain with extensive GI workup previously.  This interferes with work at times.  Patient will get FMLA papers to us  for completion.  #2 history of type 2 diabetes with poor control.  Corporate compliance at times but he states he is on all regular medications at this point.  Set up in office follow-up in January.  Needs repeat urine microalbumin at that point along with A1c  #3 hyperlipidemia.  Goal LDL less than 55.  On high-dose atorvastatin .  Recheck fasting lipids at follow-up.  Continue low saturated fat diet.   I discussed the assessment and treatment plan with the patient. The patient was provided an opportunity to ask questions and all were answered. The patient agreed with the plan and demonstrated an understanding of the instructions.   The patient was advised to call back or seek an in-person evaluation if the symptoms worsen or if the condition fails to improve as anticipated.     Wolm Scarlet, MD

## 2024-05-17 ENCOUNTER — Other Ambulatory Visit: Payer: Self-pay | Admitting: Family Medicine

## 2024-05-19 ENCOUNTER — Encounter: Payer: Self-pay | Admitting: Family Medicine

## 2024-05-26 ENCOUNTER — Ambulatory Visit (INDEPENDENT_AMBULATORY_CARE_PROVIDER_SITE_OTHER): Payer: Self-pay | Admitting: Podiatry

## 2024-05-26 VITALS — Ht 72.0 in | Wt 205.0 lb

## 2024-05-26 DIAGNOSIS — L84 Corns and callosities: Secondary | ICD-10-CM | POA: Diagnosis not present

## 2024-05-26 DIAGNOSIS — B351 Tinea unguium: Secondary | ICD-10-CM

## 2024-05-26 MED ORDER — TERBINAFINE HCL 250 MG PO TABS: 250.0000 mg | ORAL_TABLET | Freq: Every day | ORAL | 0 refills | Status: AC

## 2024-05-26 NOTE — Patient Instructions (Signed)
 Look for urea 40% cream or ointment and apply to the thickened dry skin / calluses. This can be bought over the counter, at a pharmacy or online such as [employer].

## 2024-05-28 NOTE — Progress Notes (Signed)
"  °  Subjective:  [name], male    DOB: 11-08-1975,  MRN: [id number]  Chief Complaint  Patient presents with   Nail Problem    Rm 21 Patient is here for bilateral onychomycosis.Calluses on the right and left hallux.     48 y.o. male presents with the above complaint. History confirmed with patient.    Objective:  Physical Exam: warm, good capillary refill, no trophic changes or ulcerative lesions, normal DP and PT pulses, normal sensory exam, onychomycosis, and multiple calluses      Assessment:   1. Onychomycosis   2. Callus of foot      Plan:  Patient was evaluated and treated and all questions answered.  Onychomycosis -Educated on etiology of nail fungus. -Discussed oral topical and laser therapy and risks and benefits of each -eRx for oral terbinafine  #90. Educated on risks and benefits of the medication. -Photographs taken   Recommended pumice stone and urea cream for calluses  Return in about 4 months (around 09/24/2024) for follow up after nail fungus treatment.   "

## 2024-07-03 ENCOUNTER — Other Ambulatory Visit: Payer: Self-pay | Admitting: Medical Genetics

## 2024-09-25 ENCOUNTER — Ambulatory Visit: Admitting: Podiatry
